# Patient Record
Sex: Male | Born: 1945 | Race: White | Hispanic: No | Marital: Married | Smoking: Former smoker
Health system: Southern US, Community
[De-identification: ages and names within clinical notes are randomized; demographics above are authoritative.]

## PROBLEM LIST (undated history)

## (undated) DIAGNOSIS — G8929 Other chronic pain: Secondary | ICD-10-CM

## (undated) DIAGNOSIS — K861 Other chronic pancreatitis: Secondary | ICD-10-CM

## (undated) DIAGNOSIS — N400 Enlarged prostate without lower urinary tract symptoms: Secondary | ICD-10-CM

## (undated) DIAGNOSIS — Z973 Presence of spectacles and contact lenses: Secondary | ICD-10-CM

## (undated) DIAGNOSIS — E785 Hyperlipidemia, unspecified: Secondary | ICD-10-CM

## (undated) DIAGNOSIS — G4730 Sleep apnea, unspecified: Secondary | ICD-10-CM

## (undated) DIAGNOSIS — M542 Cervicalgia: Secondary | ICD-10-CM

## (undated) DIAGNOSIS — I319 Disease of pericardium, unspecified: Secondary | ICD-10-CM

## (undated) DIAGNOSIS — R202 Paresthesia of skin: Secondary | ICD-10-CM

## (undated) DIAGNOSIS — R410 Disorientation, unspecified: Secondary | ICD-10-CM

## (undated) DIAGNOSIS — R200 Anesthesia of skin: Secondary | ICD-10-CM

## (undated) DIAGNOSIS — M1990 Unspecified osteoarthritis, unspecified site: Secondary | ICD-10-CM

## (undated) DIAGNOSIS — R0602 Shortness of breath: Secondary | ICD-10-CM

## (undated) DIAGNOSIS — E119 Type 2 diabetes mellitus without complications: Secondary | ICD-10-CM

## (undated) DIAGNOSIS — K5090 Crohn's disease, unspecified, without complications: Secondary | ICD-10-CM

## (undated) DIAGNOSIS — M549 Dorsalgia, unspecified: Secondary | ICD-10-CM

## (undated) HISTORY — PX: EYE SURGERY: SHX253

## (undated) HISTORY — DX: Disorientation, unspecified: R41.0

## (undated) HISTORY — PX: COLONOSCOPY: SHX174

## (undated) HISTORY — PX: APPENDECTOMY: SHX54

## (undated) HISTORY — DX: Hyperlipidemia, unspecified: E78.5

## (undated) HISTORY — PX: TONSILLECTOMY: SUR1361

## (undated) HISTORY — DX: Benign prostatic hyperplasia without lower urinary tract symptoms: N40.0

## (undated) HISTORY — PX: BACK SURGERY: SHX140

## (undated) HISTORY — PX: CYSTECTOMY: SUR359

## (undated) HISTORY — DX: Disease of pericardium, unspecified: I31.9

## (undated) HISTORY — DX: Dorsalgia, unspecified: M54.9

## (undated) HISTORY — PX: UMBILICAL HERNIA REPAIR: SHX196

## (undated) HISTORY — DX: Other chronic pain: G89.29

## (undated) HISTORY — DX: Type 2 diabetes mellitus without complications: E11.9

## (undated) HISTORY — DX: Cervicalgia: M54.2

## (undated) HISTORY — DX: Shortness of breath: R06.02

## (undated) HISTORY — PX: ROTATOR CUFF REPAIR: SHX139

---

## 2005-01-05 ENCOUNTER — Encounter: Admission: RE | Admit: 2005-01-05 | Discharge: 2005-01-05 | Payer: Self-pay | Admitting: Orthopedic Surgery

## 2007-07-13 ENCOUNTER — Encounter: Payer: Self-pay | Admitting: Cardiology

## 2007-07-13 ENCOUNTER — Ambulatory Visit: Payer: Self-pay | Admitting: Cardiology

## 2008-06-27 ENCOUNTER — Encounter: Payer: Self-pay | Admitting: Cardiology

## 2008-06-28 ENCOUNTER — Encounter: Payer: Self-pay | Admitting: Cardiology

## 2008-12-12 ENCOUNTER — Encounter: Payer: Self-pay | Admitting: Cardiology

## 2009-08-07 ENCOUNTER — Ambulatory Visit: Payer: Self-pay | Admitting: Cardiology

## 2009-08-07 ENCOUNTER — Encounter: Payer: Self-pay | Admitting: Cardiology

## 2009-09-08 ENCOUNTER — Encounter: Payer: Self-pay | Admitting: Cardiology

## 2009-09-12 ENCOUNTER — Encounter: Payer: Self-pay | Admitting: Cardiology

## 2009-11-03 ENCOUNTER — Encounter: Payer: Self-pay | Admitting: Cardiology

## 2009-12-16 ENCOUNTER — Encounter: Payer: Self-pay | Admitting: Cardiology

## 2009-12-17 ENCOUNTER — Ambulatory Visit: Payer: Self-pay | Admitting: Cardiology

## 2009-12-17 DIAGNOSIS — R5381 Other malaise: Secondary | ICD-10-CM

## 2009-12-17 DIAGNOSIS — R5383 Other fatigue: Secondary | ICD-10-CM

## 2009-12-17 DIAGNOSIS — E669 Obesity, unspecified: Secondary | ICD-10-CM

## 2009-12-17 DIAGNOSIS — R0602 Shortness of breath: Secondary | ICD-10-CM

## 2009-12-22 ENCOUNTER — Encounter: Payer: Self-pay | Admitting: Cardiology

## 2009-12-22 ENCOUNTER — Ambulatory Visit: Payer: Self-pay | Admitting: Cardiology

## 2010-01-21 ENCOUNTER — Ambulatory Visit: Payer: Self-pay | Admitting: Cardiology

## 2010-10-07 ENCOUNTER — Encounter: Admission: RE | Admit: 2010-10-07 | Discharge: 2010-10-07 | Payer: Self-pay | Admitting: Specialist

## 2010-10-08 ENCOUNTER — Observation Stay (HOSPITAL_COMMUNITY): Admission: RE | Admit: 2010-10-08 | Discharge: 2010-10-10 | Payer: Self-pay | Admitting: Specialist

## 2010-12-29 NOTE — Assessment & Plan Note (Signed)
Summary: f/u [place]   Visit Type:  Follow-up Primary Provider:  Dr. Erasmo Downer  CC:  Dyspnea.  History of Present Illness: The patient presents for followup of his complaints that included leg weakness and dyspnea. An echocardiogram to specifically look for any evidence of constrictive pericarditis in a patient with a history of previous pericarditis and effusion demonstrated no such abnormality. He had a well-preserved ejection fraction and no significant valvular abnormalities. Since that last visit the patient has been found to have a low testosterone level and is currently having this replaced. With this he is starting to feel better. He thinks he is a little less dyspneic and has a little more strength in his legs. He is not describing any new chest pressure, neck or arm discomfort. He is not having any palpitations, presyncope or syncope. He is not having PND or orthopnea.  Preventive Screening-Counseling & Management  Alcohol-Tobacco     Smoking Status: quit > 6 months  Comments: Quit in May or June of 2010. Smoked for about 40 yrs  Current Medications (verified): 1)  Lantus 100 Unit/ml Soln (Insulin Glargine) .... Use As Directed 2)  Apidra 100 Unit/ml Soln (Insulin Glulisine) .... Use As Directed 3)  Melatonin 5 Mg Tabs (Melatonin) .... Take 1 Tablet By Mouth Once A Day 4)  Aspirin 81 Mg Tbec (Aspirin) .... Take 1 Tablet By Mouth Once A Day 5)  Pravachol 20 Mg Tabs (Pravastatin Sodium) .... Take 1 Tablet By Mouth Once A Day 6)  Percocet 10-325 Mg Tabs (Oxycodone-Acetaminophen) .... Pt Takes Up To 7 Times Per Day 7)  Soma 350 Mg Tabs (Carisoprodol) .... Take 1 Tablet By Mouth Four Times Per Day 8)  Neurontin 600 Mg Tabs (Gabapentin) .... Take 1 Tablet By Mouth Four Times Per Day 9)  Ambien 5 Mg Tabs (Zolpidem Tartrate) .... Take 1 Tablet By Mouth Once A Day 10)  Metformin Hcl 500 Mg Tabs (Metformin Hcl) .... Take 1 Tablet By Mouth Two Times A Day 11)  Avodart 0.5 Mg Caps  (Dutasteride) .... Take 1 Tablet By Mouth Once A Day 12)  Testosterone Cypionate 100 Mg/ml Oil (Testosterone Cypionate) .... Inject 1 Ml Once Per Week 13)  B Complex  Tabs (B Complex Vitamins) .... Take 1 Tablet By Mouth Once A Day 14)  Vitamin D 2000 Unit Tabs (Cholecalciferol) .... Take 1 Tablet By Mouth Once A Day  Allergies: 1)  ! Lodine 2)  ! * Contrast Dye 3)  Nsaids  Comments:  Nurse/Medical Assistant: The patient's medications were reviewed with the patient and were updated in the Medication List. Pt verbally confirmed medications.  Cyril Loosen, RN, BSN (January 21, 2010 12:30 PM)  Past History:  Past Medical History: Reviewed history from 12/17/2009 and no changes required. Shortness of Breath Confusion Hyperlipidemia mild x 1 year Type II diabetes x 7 years Chronic back and neck pain BPH Pericarditis 6 years ago  Past Surgical History: Reviewed history from 12/17/2009 and no changes required. Right bicep rotator cuff surgery Umbilical hernia surgery/appendectomy  Review of Systems       As stated in the HPI and negative for all other systems.   Vital Signs:  Patient profile:   65 year old male Height:      64 inches Weight:      227 pounds BMI:     39.11 Pulse rate:   77 / minute BP sitting:   116 / 81  (left arm) Cuff size:   regular  Vitals Entered By: Cyril Loosen, RN, BSN (January 21, 2010 12:25 PM)  Nutrition Counseling: Patient's BMI is greater than 25 and therefore counseled on weight management options. CC: Dyspnea Comments follow up visit   Physical Exam  General:  Well developed, well nourished, in no acute distress. Head:  normocephalic and atraumatic Eyes:  PERRLA/EOM intact; conjunctiva and lids normal. Mouth:  Teeth, gums and palate normal. Oral mucosa normal. Neck:  Neck supple, no JVD. No masses, thyromegaly or abnormal cervical nodes. Chest Wall:  no deformities or breast masses noted Lungs:  Clear bilaterally to  auscultation and percussion. Heart:  Non-displaced PMI, chest non-tender; regular rate and rhythm, S1, S2 without murmurs, rubs or gallops. Carotid upstroke normal, no bruit. Normal abdominal aortic size, no bruits. Femorals normal pulses, no bruits. Pedals normal pulses. No edema, no varicosities. Abdomen:  Bowel sounds positive; abdomen soft and non-tender without masses, organomegaly, or hernias noted. No hepatosplenomegaly, obese Msk:  Back normal, normal gait. Muscle strength and tone normal. Extremities:  No clubbing or cyanosis. Neurologic:  Alert and oriented x 3. Skin:  Intact without lesions or rashes. Psych:  Normal affect.   Impression & Recommendations:  Problem # 1:  DYSPNEA (ICD-786.05) At this point I don't see a clear etiology for his dyspnea. It may be related to weight gain when he stopped smoking and deconditioning. As he is getting stronger with the testosterone replacement we will see if he can increase his physical activity and developed less dyspnea rather than more. If he does not improve I will do cardiopulmonary stress testing.  Problem # 2:  OBESITY, UNSPECIFIED (ICD-278.00) He understands the need to add weight loss to his exercise regimen to see if the above problem improves.  Problem # 3:  WEAKNESS (ICD-780.79) This may be related to his low testosterone level. He will see if this slowly improved. He does have a followup appointment with his back surgeon to see if this could be contributing as well as the weakness is predominantly in his legs.  Patient Instructions: 1)  Your physician recommends that you continue on your current medications as directed. Please refer to the Current Medication list given to you today. 2)  Contact our office if you need follow up in the future.

## 2010-12-29 NOTE — Assessment & Plan Note (Signed)
Summary: Micheal Parker (exertional)   Visit Type:  Initial Consult Primary Provider:  Dr. Erasmo Downer  CC:  Chest pain and sob.  History of Present Illness: The patient presents for evaluation of dyspnea and leg fatigue. He has had a history of leg fatigue dating back to April 2011. 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 He became much less active. He described numbness and weakness in his legs. He did not describe leg pain or cramping. He did not have swelling. Around that time he quit smoking. With his decreased activity and tobacco cessation he gained about 20 pounds. He began to slowly noticed significantly increased dyspnea with minimal activity such as climbing a flight of stairs. Sitting around doing nothing he will have occasional episodes where he feels like he needs to take a deep breath. He does not describe PND or orthopnea. He does describe chest discomfort but it seems to be more with taking a deep breath or certain movements. He was referred for a stress echocardiogram which became a dobutamine echocardiogram. He had an ejection fraction of 55-60% with no wall motion. Pulmonary function testing was not particularly abnormal either. I reviewed both of these results.  Preventive Screening-Counseling & Management  Alcohol-Tobacco     Smoking Status: quit > 6 months     Year Quit: 2010  Comments: Pt quit smoking around May of 2010, smoked for about 46 yrs  Current Medications (verified): 1)  Lantus 100 Unit/ml Soln (Insulin Glargine) .... Use As Directed 2)  Apidra 100 Unit/ml Soln (Insulin Glulisine) .... Use As Directed 3)  Melatonin 5 Mg Tabs (Melatonin) .... Take 1 Tablet By Mouth Once A Day 4)  Aspirin 81 Mg Tbec (Aspirin) .... Take 1 Tablet By Mouth Once A Day 5)  Pravachol 20 Mg Tabs (Pravastatin Sodium) .... Take 1 Tablet By Mouth Once A Day 6)  Percocet 10-325 Mg Tabs  (Oxycodone-Acetaminophen) .... Pt Takes Up To 7 Times Per Day 7)  Soma 350 Mg Tabs (Carisoprodol) .... Take 1 Tablet By Mouth Four Times Per Day 8)  Neurontin 600 Mg Tabs (Gabapentin) .... Take 1 Tablet By Mouth Four Times Per Day 9)  Ambien 5 Mg Tabs (Zolpidem Tartrate) .... Take 1 Tablet By Mouth Once A Day 10)  Metformin Hcl 500 Mg Tabs (Metformin Hcl) .... Take 1 Tablet By Mouth Two Times A Day 11)  Avodart 0.5 Mg Caps (Dutasteride) .... Take 1 Tablet By Mouth Once A Day 12)  Testosterone Cypionate 100 Mg/ml Oil (Testosterone Cypionate) .... Inject 1/2 Ml Every 2 Weeks 13)  B Complex  Tabs (B Complex Vitamins) .... Take 1 Tablet By Mouth Once A Day 14)  Vitamin D 2000 Unit Tabs (Cholecalciferol) .... Take 1 Tablet By Mouth Once A Day 15)  Lamisil 250 Mg Tabs (Terbinafine Hcl) .... Take 1 Tablet By Mouth Once A Day  Allergies: 1)  ! Lodine 2)  ! * Contrast Dye 3)  Nsaids  Comments:  Nurse/Medical Assistant: The patient's medications were reviewed with the patient and were updated in the Medication List. Pt brought medication list to office visit today. Cyril Loosen, RN, BSN (December 17, 2009 10:18 AM)  Past History:  Past Medical History: Shortness of Breath Confusion Hyperlipidemia mild x 1 year Type II diabetes x 7 years Chronic back and neck pain BPH Pericarditis 6 years ago  Past Surgical History: Right bicep rotator  cuff surgery Umbilical hernia surgery/appendectomy  Family History: Family Hx of Hypertension, Hyperlipidemia, Chronic Back pain, type II Diabetes with complications including retinopathy, neuropathy.  Father starting with heart disease MI late 14s,  Brother with MI in eary 66s.  Both father and brother died of pulmonary emboli  Social History: Unable to work due to chronic back and neck pain Married  Quit tobacco last year after 40 years 2ppd Smoking Status:  quit > 6 months  Review of Systems       As stated in the HPI and negative for all  other systems.   Vital Signs:  Patient profile:   65 year old male Height:      64 inches Weight:      219.50 pounds BMI:     37.81 Pulse rate:   76 / minute BP sitting:   119 / 70  (left arm) Cuff size:   regular  Vitals Entered By: Cyril Loosen, RN, BSN (December 17, 2009 10:05 AM)  Nutrition Counseling: Patient's BMI is greater than 25 and therefore counseled on weight management options. CC: Chest pain, sob   Physical Exam  General:  Well developed, well nourished, in no acute distress. Head:  normocephalic and atraumatic Eyes:  PERRLA/EOM intact; conjunctiva and lids normal. Mouth:  Teeth, gums and palate normal. Oral mucosa normal. Neck:  Neck supple, no JVD. No masses, thyromegaly or abnormal cervical nodes. Chest Wall:  no deformities or breast masses noted Lungs:  Clear bilaterally to auscultation and percussion. Abdomen:  Bowel sounds positive; abdomen soft and non-tender without masses, organomegaly, or hernias noted. No hepatosplenomegaly, obese Msk:  Back normal, normal gait. Muscle strength and tone normal. Extremities:  No clubbing or cyanosis. Neurologic:  Alert and oriented x 3. Skin:  Intact without lesions or rashes. Cervical Nodes:  no significant adenopathy Axillary Nodes:  no significant adenopathy Inguinal Nodes:  no significant adenopathy Psych:  Normal affect.   Detailed Cardiovascular Exam  Neck    Carotids: Carotids full and equal bilaterally without bruits.      Neck Veins: Normal, no JVD.    Heart    Inspection: no deformities or lifts noted.      Palpation: normal PMI with no thrills palpable.      Auscultation: regular rate and rhythm, S1, S2 without murmurs, rubs, gallops, or clicks.    Vascular    Abdominal Aorta: no palpable masses, pulsations, or audible bruits.      Femoral Pulses: normal femoral pulses bilaterally.      Pedal Pulses: normal pedal pulses bilaterally.      Radial Pulses: normal radial pulses bilaterally.       Peripheral Circulation: no clubbing, cyanosis, or edema noted with normal capillary refill.     EKG  Procedure date:  12/17/2009  Findings:      sinus rhythm, rate 73, axis within normal limits, intervals within normal limits, no acute ST-T wave changes.  Impression & Recommendations:  Problem # 1:  DYSPNEA (ICD-786.05) This is the predominant complaint. Based on the stress echocardiogram I would not have a high suspicion for obstructive coronary disease though this still remains a possibility. I would like to get a dedicated echocardiogram to evaluate the possibility of diastolic dysfunction or perhaps constrictive pericarditis related to his previous pericarditis. Is also a family history of pulmonary emboli though both of these relatives had extenuating risk factors. I would like to assess his pulmonary pressures with this echo and will have a low threshold for VQ  scanning. I will also check a BNP level. Orders: T-BNP  (B Natriuretic Peptide) ([id number]) 2-D Echocardiogram (2D Echo)  Problem # 2:  OBESITY, UNSPECIFIED (ICD-278.00) He understands the need to lose weight with diet now as he cannot exercise.  Problem # 3:  WEAKNESS (ICD-780.79) I am concerned that this could be related to his chronic back problems and have asked him to get an appointment with his orthopedist to consider this possibility. At this point I do not strongly suspect a vascular etiology.  Other Orders: EKG w/ Interpretation (93000)  Patient Instructions: 1)  Your physician has requested that you have an echocardiogram.  Echocardiography is a painless test that uses sound waves to create images of your heart. It provides your doctor with information about the size and shape of your heart and how well your heart's chambers and valves are working.  This procedure takes approximately one hour. There are no restrictions for this procedure. You will be called by Vicky to have this scheduled. 2)  Your physician  recommends that you return for lab work [id number] AT THE [facility] FOR A BNP. 3)  Your physician recommends that you schedule a follow-up appointment in: 01/21/10 @12 :00 NOON WITH DR. Alice Burnside.

## 2010-12-29 NOTE — Letter (Signed)
Summary: Internal Other/ PATIENT INFORMATION  Internal Other/ PATIENT INFORMATION   Imported By: Dorise Hiss 12/17/2009 12:06:35  _____________________________________________________________________  External Attachment:    Type:   Image     Comment:   External Document

## 2011-01-14 IMAGING — CR DG OR PORTABLE SPINE
1 series · 1 of 1 positions shown · non-contrast
Comparison: Radiographs dated [DATE] and 10/06/2010

CLINICAL DATA: Herniated lumbar disc at L3-4.

PORTABLE SPINE

[series [date]]
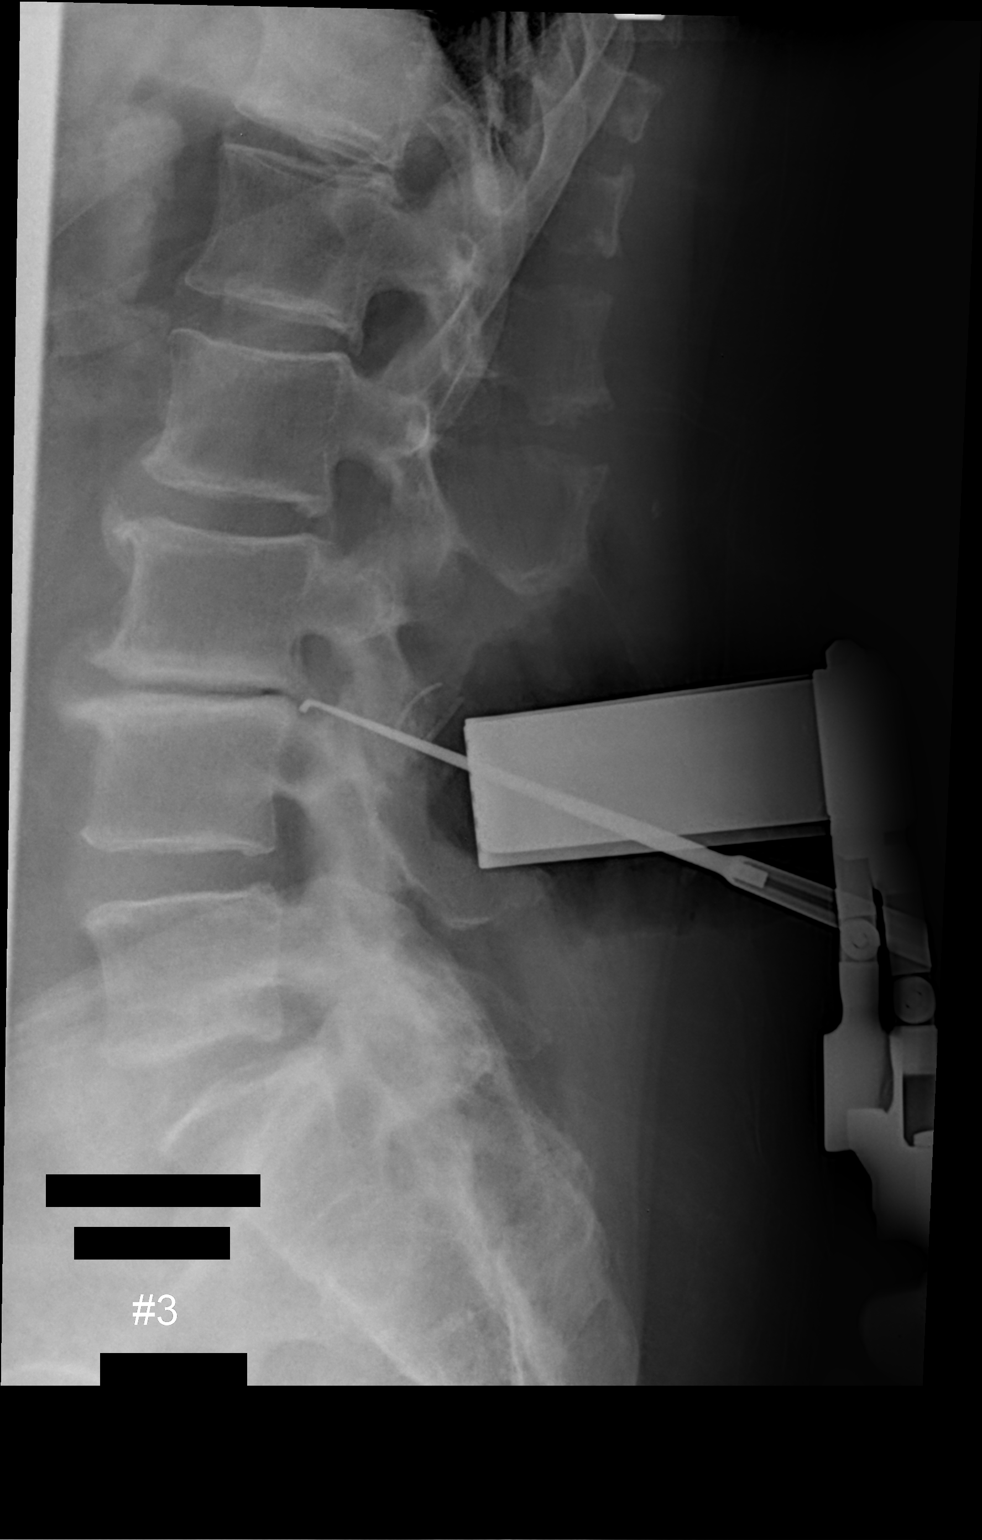

[1 of 1 positions shown; findings below may reference images not displayed]

FINDINGS: There is a probe at the L3-4 level.
IMPRESSION: Instrument at L3-4 disc space level.

## 2011-02-09 LAB — URINALYSIS, ROUTINE W REFLEX MICROSCOPIC
Bilirubin Urine: NEGATIVE
Ketones, ur: NEGATIVE mg/dL
Leukocytes, UA: NEGATIVE
Nitrite: NEGATIVE
Urobilinogen, UA: 0.2 mg/dL (ref 0.0–1.0)
pH: 5.5 (ref 5.0–8.0)

## 2011-02-09 LAB — GLUCOSE, CAPILLARY
Glucose-Capillary: 103 mg/dL — ABNORMAL HIGH (ref 70–99)
Glucose-Capillary: 118 mg/dL — ABNORMAL HIGH (ref 70–99)
Glucose-Capillary: 120 mg/dL — ABNORMAL HIGH (ref 70–99)
Glucose-Capillary: 133 mg/dL — ABNORMAL HIGH (ref 70–99)
Glucose-Capillary: 140 mg/dL — ABNORMAL HIGH (ref 70–99)
Glucose-Capillary: 148 mg/dL — ABNORMAL HIGH (ref 70–99)
Glucose-Capillary: 157 mg/dL — ABNORMAL HIGH (ref 70–99)
Glucose-Capillary: 174 mg/dL — ABNORMAL HIGH (ref 70–99)
Glucose-Capillary: 182 mg/dL — ABNORMAL HIGH (ref 70–99)
Glucose-Capillary: 86 mg/dL (ref 70–99)

## 2011-02-09 LAB — CBC
HCT: 51.7 % (ref 39.0–52.0)
Hemoglobin: 17.5 g/dL — ABNORMAL HIGH (ref 13.0–17.0)
MCH: 30.2 pg (ref 26.0–34.0)
MCHC: 33.8 g/dL (ref 30.0–36.0)
MCHC: 33.9 g/dL (ref 30.0–36.0)
MCV: 89.3 fL (ref 78.0–100.0)
Platelets: 201 10*3/uL (ref 150–400)
RBC: 5.79 MIL/uL (ref 4.22–5.81)
RDW: 14.7 % (ref 11.5–15.5)
WBC: 12 10*3/uL — ABNORMAL HIGH (ref 4.0–10.5)

## 2011-02-09 LAB — SURGICAL PCR SCREEN
MRSA, PCR: NEGATIVE
Staphylococcus aureus: NEGATIVE

## 2011-02-09 LAB — COMPREHENSIVE METABOLIC PANEL
AST: 21 U/L (ref 0–37)
BUN: 9 mg/dL (ref 6–23)
CO2: 28 mEq/L (ref 19–32)
Calcium: 8.9 mg/dL (ref 8.4–10.5)
Chloride: 97 mEq/L (ref 96–112)
Creatinine, Ser: 1.27 mg/dL (ref 0.4–1.5)
GFR calc Af Amer: >60 mL/min (ref >60)
GFR calc non Af Amer: 57 mL/min — ABNORMAL LOW (ref >60)
Glucose, Bld: 260 mg/dL — ABNORMAL HIGH (ref 70–99)
Total Bilirubin: 0.7 mg/dL (ref 0.3–1.2)

## 2011-02-09 LAB — BASIC METABOLIC PANEL
BUN: 7 mg/dL (ref 6–23)
Calcium: 8.5 mg/dL (ref 8.4–10.5)
Creatinine, Ser: 1.17 mg/dL (ref 0.4–1.5)
GFR calc non Af Amer: >60 mL/min (ref >60)
Glucose, Bld: 136 mg/dL — ABNORMAL HIGH (ref 70–99)
Potassium: 4.1 mEq/L (ref 3.5–5.1)

## 2011-02-09 LAB — APTT: aPTT: 33 seconds (ref 24–37)

## 2011-02-09 LAB — PROTIME-INR
INR: 0.97 (ref 0.00–1.49)
Prothrombin Time: 13.1 seconds (ref 11.6–15.2)

## 2011-02-09 LAB — URINE MICROSCOPIC-ADD ON

## 2012-05-25 ENCOUNTER — Encounter: Payer: Self-pay | Admitting: *Deleted

## 2012-12-13 DIAGNOSIS — K5900 Constipation, unspecified: Secondary | ICD-10-CM | POA: Diagnosis not present

## 2012-12-13 DIAGNOSIS — R198 Other specified symptoms and signs involving the digestive system and abdomen: Secondary | ICD-10-CM | POA: Diagnosis not present

## 2012-12-18 DIAGNOSIS — K5090 Crohn's disease, unspecified, without complications: Secondary | ICD-10-CM | POA: Diagnosis not present

## 2012-12-18 DIAGNOSIS — Z79899 Other long term (current) drug therapy: Secondary | ICD-10-CM | POA: Diagnosis not present

## 2012-12-18 DIAGNOSIS — E669 Obesity, unspecified: Secondary | ICD-10-CM | POA: Diagnosis not present

## 2012-12-18 DIAGNOSIS — K5289 Other specified noninfective gastroenteritis and colitis: Secondary | ICD-10-CM | POA: Diagnosis not present

## 2012-12-18 DIAGNOSIS — G4700 Insomnia, unspecified: Secondary | ICD-10-CM | POA: Diagnosis not present

## 2012-12-18 DIAGNOSIS — Z6834 Body mass index (BMI) 34.0-34.9, adult: Secondary | ICD-10-CM | POA: Diagnosis not present

## 2012-12-18 DIAGNOSIS — Z7982 Long term (current) use of aspirin: Secondary | ICD-10-CM | POA: Diagnosis not present

## 2012-12-18 DIAGNOSIS — E119 Type 2 diabetes mellitus without complications: Secondary | ICD-10-CM | POA: Diagnosis not present

## 2012-12-18 DIAGNOSIS — N529 Male erectile dysfunction, unspecified: Secondary | ICD-10-CM | POA: Diagnosis not present

## 2012-12-18 DIAGNOSIS — K8689 Other specified diseases of pancreas: Secondary | ICD-10-CM | POA: Diagnosis not present

## 2012-12-18 DIAGNOSIS — N281 Cyst of kidney, acquired: Secondary | ICD-10-CM | POA: Diagnosis not present

## 2012-12-18 DIAGNOSIS — G589 Mononeuropathy, unspecified: Secondary | ICD-10-CM | POA: Diagnosis not present

## 2012-12-18 DIAGNOSIS — IMO0002 Reserved for concepts with insufficient information to code with codable children: Secondary | ICD-10-CM | POA: Diagnosis not present

## 2012-12-18 DIAGNOSIS — G8929 Other chronic pain: Secondary | ICD-10-CM | POA: Diagnosis not present

## 2012-12-18 DIAGNOSIS — Z91041 Radiographic dye allergy status: Secondary | ICD-10-CM | POA: Diagnosis not present

## 2012-12-18 DIAGNOSIS — K639 Disease of intestine, unspecified: Secondary | ICD-10-CM | POA: Diagnosis not present

## 2012-12-18 DIAGNOSIS — K6389 Other specified diseases of intestine: Secondary | ICD-10-CM | POA: Diagnosis not present

## 2012-12-18 DIAGNOSIS — K5900 Constipation, unspecified: Secondary | ICD-10-CM | POA: Diagnosis not present

## 2012-12-18 DIAGNOSIS — N400 Enlarged prostate without lower urinary tract symptoms: Secondary | ICD-10-CM | POA: Diagnosis not present

## 2012-12-18 DIAGNOSIS — Z794 Long term (current) use of insulin: Secondary | ICD-10-CM | POA: Diagnosis not present

## 2012-12-18 DIAGNOSIS — R198 Other specified symptoms and signs involving the digestive system and abdomen: Secondary | ICD-10-CM | POA: Diagnosis not present

## 2012-12-18 DIAGNOSIS — E785 Hyperlipidemia, unspecified: Secondary | ICD-10-CM | POA: Diagnosis not present

## 2013-01-04 DIAGNOSIS — K6389 Other specified diseases of intestine: Secondary | ICD-10-CM | POA: Diagnosis not present

## 2013-02-01 DIAGNOSIS — K5000 Crohn's disease of small intestine without complications: Secondary | ICD-10-CM | POA: Diagnosis not present

## 2013-02-06 DIAGNOSIS — M5030 Other cervical disc degeneration, unspecified cervical region: Secondary | ICD-10-CM | POA: Diagnosis not present

## 2013-02-06 DIAGNOSIS — M51379 Other intervertebral disc degeneration, lumbosacral region without mention of lumbar back pain or lower extremity pain: Secondary | ICD-10-CM | POA: Diagnosis not present

## 2013-02-09 DIAGNOSIS — G8929 Other chronic pain: Secondary | ICD-10-CM | POA: Diagnosis not present

## 2013-02-09 DIAGNOSIS — E785 Hyperlipidemia, unspecified: Secondary | ICD-10-CM | POA: Diagnosis not present

## 2013-02-09 DIAGNOSIS — Z6835 Body mass index (BMI) 35.0-35.9, adult: Secondary | ICD-10-CM | POA: Diagnosis not present

## 2013-02-09 DIAGNOSIS — Z87891 Personal history of nicotine dependence: Secondary | ICD-10-CM | POA: Diagnosis not present

## 2013-02-09 DIAGNOSIS — K5900 Constipation, unspecified: Secondary | ICD-10-CM | POA: Diagnosis not present

## 2013-02-09 DIAGNOSIS — Z8489 Family history of other specified conditions: Secondary | ICD-10-CM | POA: Diagnosis not present

## 2013-02-09 DIAGNOSIS — D126 Benign neoplasm of colon, unspecified: Secondary | ICD-10-CM | POA: Diagnosis not present

## 2013-02-09 DIAGNOSIS — Z833 Family history of diabetes mellitus: Secondary | ICD-10-CM | POA: Diagnosis not present

## 2013-02-09 DIAGNOSIS — K5000 Crohn's disease of small intestine without complications: Secondary | ICD-10-CM | POA: Diagnosis not present

## 2013-02-09 DIAGNOSIS — E291 Testicular hypofunction: Secondary | ICD-10-CM | POA: Diagnosis not present

## 2013-02-09 DIAGNOSIS — E663 Overweight: Secondary | ICD-10-CM | POA: Diagnosis not present

## 2013-02-09 DIAGNOSIS — E119 Type 2 diabetes mellitus without complications: Secondary | ICD-10-CM | POA: Diagnosis not present

## 2013-02-09 DIAGNOSIS — K5730 Diverticulosis of large intestine without perforation or abscess without bleeding: Secondary | ICD-10-CM | POA: Diagnosis not present

## 2013-02-09 DIAGNOSIS — G4700 Insomnia, unspecified: Secondary | ICD-10-CM | POA: Diagnosis not present

## 2013-02-09 DIAGNOSIS — Z91041 Radiographic dye allergy status: Secondary | ICD-10-CM | POA: Diagnosis not present

## 2013-02-09 DIAGNOSIS — N400 Enlarged prostate without lower urinary tract symptoms: Secondary | ICD-10-CM | POA: Diagnosis not present

## 2013-02-09 DIAGNOSIS — G589 Mononeuropathy, unspecified: Secondary | ICD-10-CM | POA: Diagnosis not present

## 2013-02-27 DIAGNOSIS — E119 Type 2 diabetes mellitus without complications: Secondary | ICD-10-CM | POA: Diagnosis not present

## 2013-02-27 DIAGNOSIS — H2510 Age-related nuclear cataract, unspecified eye: Secondary | ICD-10-CM | POA: Diagnosis not present

## 2013-03-19 DIAGNOSIS — H538 Other visual disturbances: Secondary | ICD-10-CM | POA: Diagnosis not present

## 2013-03-19 DIAGNOSIS — H2510 Age-related nuclear cataract, unspecified eye: Secondary | ICD-10-CM | POA: Diagnosis not present

## 2013-03-19 DIAGNOSIS — H35059 Retinal neovascularization, unspecified, unspecified eye: Secondary | ICD-10-CM | POA: Diagnosis not present

## 2013-03-19 DIAGNOSIS — E119 Type 2 diabetes mellitus without complications: Secondary | ICD-10-CM | POA: Diagnosis not present

## 2013-03-20 DIAGNOSIS — E782 Mixed hyperlipidemia: Secondary | ICD-10-CM | POA: Diagnosis not present

## 2013-03-20 DIAGNOSIS — Z79899 Other long term (current) drug therapy: Secondary | ICD-10-CM | POA: Diagnosis not present

## 2013-03-29 DIAGNOSIS — M542 Cervicalgia: Secondary | ICD-10-CM | POA: Diagnosis not present

## 2013-03-29 DIAGNOSIS — M546 Pain in thoracic spine: Secondary | ICD-10-CM | POA: Diagnosis not present

## 2013-03-29 DIAGNOSIS — M545 Low back pain: Secondary | ICD-10-CM | POA: Diagnosis not present

## 2013-04-26 DIAGNOSIS — M542 Cervicalgia: Secondary | ICD-10-CM | POA: Diagnosis not present

## 2013-04-26 DIAGNOSIS — G894 Chronic pain syndrome: Secondary | ICD-10-CM | POA: Diagnosis not present

## 2013-04-26 DIAGNOSIS — M545 Low back pain: Secondary | ICD-10-CM | POA: Diagnosis not present

## 2013-04-26 DIAGNOSIS — M546 Pain in thoracic spine: Secondary | ICD-10-CM | POA: Diagnosis not present

## 2013-05-02 DIAGNOSIS — R079 Chest pain, unspecified: Secondary | ICD-10-CM | POA: Diagnosis not present

## 2013-05-02 DIAGNOSIS — G894 Chronic pain syndrome: Secondary | ICD-10-CM | POA: Diagnosis not present

## 2013-05-02 DIAGNOSIS — R109 Unspecified abdominal pain: Secondary | ICD-10-CM | POA: Diagnosis not present

## 2013-05-02 DIAGNOSIS — M47814 Spondylosis without myelopathy or radiculopathy, thoracic region: Secondary | ICD-10-CM | POA: Diagnosis not present

## 2013-05-02 DIAGNOSIS — M47817 Spondylosis without myelopathy or radiculopathy, lumbosacral region: Secondary | ICD-10-CM | POA: Diagnosis not present

## 2013-05-17 DIAGNOSIS — M545 Low back pain: Secondary | ICD-10-CM | POA: Diagnosis not present

## 2013-05-17 DIAGNOSIS — G894 Chronic pain syndrome: Secondary | ICD-10-CM | POA: Diagnosis not present

## 2013-05-17 DIAGNOSIS — M542 Cervicalgia: Secondary | ICD-10-CM | POA: Diagnosis not present

## 2013-05-17 DIAGNOSIS — Z79899 Other long term (current) drug therapy: Secondary | ICD-10-CM | POA: Diagnosis not present

## 2013-05-29 DIAGNOSIS — M545 Low back pain: Secondary | ICD-10-CM | POA: Diagnosis not present

## 2013-05-29 DIAGNOSIS — G894 Chronic pain syndrome: Secondary | ICD-10-CM | POA: Diagnosis not present

## 2013-08-28 DIAGNOSIS — M545 Low back pain: Secondary | ICD-10-CM | POA: Diagnosis not present

## 2013-08-28 DIAGNOSIS — M542 Cervicalgia: Secondary | ICD-10-CM | POA: Diagnosis not present

## 2013-08-28 DIAGNOSIS — M961 Postlaminectomy syndrome, not elsewhere classified: Secondary | ICD-10-CM | POA: Diagnosis not present

## 2013-08-28 DIAGNOSIS — G894 Chronic pain syndrome: Secondary | ICD-10-CM | POA: Diagnosis not present

## 2013-09-06 DIAGNOSIS — M542 Cervicalgia: Secondary | ICD-10-CM | POA: Diagnosis not present

## 2013-09-12 DIAGNOSIS — M542 Cervicalgia: Secondary | ICD-10-CM | POA: Diagnosis not present

## 2013-09-12 DIAGNOSIS — M545 Low back pain, unspecified: Secondary | ICD-10-CM | POA: Diagnosis not present

## 2013-09-17 DIAGNOSIS — Z23 Encounter for immunization: Secondary | ICD-10-CM | POA: Diagnosis not present

## 2013-09-20 DIAGNOSIS — M65839 Other synovitis and tenosynovitis, unspecified forearm: Secondary | ICD-10-CM | POA: Diagnosis not present

## 2013-09-27 DIAGNOSIS — M65839 Other synovitis and tenosynovitis, unspecified forearm: Secondary | ICD-10-CM | POA: Diagnosis not present

## 2013-09-28 DIAGNOSIS — E782 Mixed hyperlipidemia: Secondary | ICD-10-CM | POA: Diagnosis not present

## 2013-09-28 DIAGNOSIS — E1049 Type 1 diabetes mellitus with other diabetic neurological complication: Secondary | ICD-10-CM | POA: Diagnosis not present

## 2013-10-18 DIAGNOSIS — M65839 Other synovitis and tenosynovitis, unspecified forearm: Secondary | ICD-10-CM | POA: Diagnosis not present

## 2013-11-26 DIAGNOSIS — G894 Chronic pain syndrome: Secondary | ICD-10-CM | POA: Diagnosis not present

## 2013-11-26 DIAGNOSIS — M65839 Other synovitis and tenosynovitis, unspecified forearm: Secondary | ICD-10-CM | POA: Diagnosis not present

## 2013-11-27 DIAGNOSIS — J019 Acute sinusitis, unspecified: Secondary | ICD-10-CM | POA: Diagnosis not present

## 2013-11-27 DIAGNOSIS — J209 Acute bronchitis, unspecified: Secondary | ICD-10-CM | POA: Diagnosis not present

## 2013-11-28 DIAGNOSIS — J209 Acute bronchitis, unspecified: Secondary | ICD-10-CM | POA: Diagnosis not present

## 2013-11-28 DIAGNOSIS — R0602 Shortness of breath: Secondary | ICD-10-CM | POA: Diagnosis not present

## 2013-11-28 DIAGNOSIS — J9801 Acute bronchospasm: Secondary | ICD-10-CM | POA: Diagnosis not present

## 2013-11-28 DIAGNOSIS — M47814 Spondylosis without myelopathy or radiculopathy, thoracic region: Secondary | ICD-10-CM | POA: Diagnosis not present

## 2013-11-28 DIAGNOSIS — Z0389 Encounter for observation for other suspected diseases and conditions ruled out: Secondary | ICD-10-CM | POA: Diagnosis not present

## 2013-11-28 DIAGNOSIS — R222 Localized swelling, mass and lump, trunk: Secondary | ICD-10-CM | POA: Diagnosis not present

## 2013-11-28 DIAGNOSIS — Z91041 Radiographic dye allergy status: Secondary | ICD-10-CM | POA: Diagnosis not present

## 2013-11-29 HISTORY — PX: CARDIAC CATHETERIZATION: SHX172

## 2013-12-11 DIAGNOSIS — M545 Low back pain, unspecified: Secondary | ICD-10-CM | POA: Diagnosis not present

## 2013-12-11 DIAGNOSIS — M542 Cervicalgia: Secondary | ICD-10-CM | POA: Diagnosis not present

## 2014-01-21 DIAGNOSIS — R109 Unspecified abdominal pain: Secondary | ICD-10-CM | POA: Diagnosis not present

## 2014-01-21 DIAGNOSIS — E782 Mixed hyperlipidemia: Secondary | ICD-10-CM | POA: Diagnosis not present

## 2014-01-21 DIAGNOSIS — IMO0001 Reserved for inherently not codable concepts without codable children: Secondary | ICD-10-CM | POA: Diagnosis not present

## 2014-01-23 DIAGNOSIS — R109 Unspecified abdominal pain: Secondary | ICD-10-CM | POA: Diagnosis not present

## 2014-01-23 DIAGNOSIS — N281 Cyst of kidney, acquired: Secondary | ICD-10-CM | POA: Diagnosis not present

## 2014-02-08 DIAGNOSIS — M542 Cervicalgia: Secondary | ICD-10-CM | POA: Diagnosis not present

## 2014-02-20 DIAGNOSIS — M542 Cervicalgia: Secondary | ICD-10-CM | POA: Diagnosis not present

## 2014-03-20 DIAGNOSIS — M545 Low back pain, unspecified: Secondary | ICD-10-CM | POA: Diagnosis not present

## 2014-03-20 DIAGNOSIS — M542 Cervicalgia: Secondary | ICD-10-CM | POA: Diagnosis not present

## 2014-03-21 DIAGNOSIS — IMO0001 Reserved for inherently not codable concepts without codable children: Secondary | ICD-10-CM | POA: Diagnosis not present

## 2014-03-21 DIAGNOSIS — I201 Angina pectoris with documented spasm: Secondary | ICD-10-CM | POA: Diagnosis not present

## 2014-03-25 DIAGNOSIS — M6530 Trigger finger, unspecified finger: Secondary | ICD-10-CM | POA: Diagnosis not present

## 2014-04-09 DIAGNOSIS — I209 Angina pectoris, unspecified: Secondary | ICD-10-CM | POA: Diagnosis not present

## 2014-04-09 DIAGNOSIS — R0602 Shortness of breath: Secondary | ICD-10-CM | POA: Diagnosis not present

## 2014-04-09 DIAGNOSIS — R079 Chest pain, unspecified: Secondary | ICD-10-CM | POA: Diagnosis not present

## 2014-04-10 DIAGNOSIS — M6530 Trigger finger, unspecified finger: Secondary | ICD-10-CM | POA: Diagnosis not present

## 2014-04-15 DIAGNOSIS — R0609 Other forms of dyspnea: Secondary | ICD-10-CM | POA: Diagnosis not present

## 2014-04-15 DIAGNOSIS — R0989 Other specified symptoms and signs involving the circulatory and respiratory systems: Secondary | ICD-10-CM | POA: Diagnosis not present

## 2014-04-15 DIAGNOSIS — E119 Type 2 diabetes mellitus without complications: Secondary | ICD-10-CM | POA: Diagnosis not present

## 2014-04-15 DIAGNOSIS — I209 Angina pectoris, unspecified: Secondary | ICD-10-CM | POA: Diagnosis not present

## 2014-04-15 DIAGNOSIS — Z87891 Personal history of nicotine dependence: Secondary | ICD-10-CM | POA: Diagnosis not present

## 2014-04-15 DIAGNOSIS — I2510 Atherosclerotic heart disease of native coronary artery without angina pectoris: Secondary | ICD-10-CM | POA: Diagnosis not present

## 2014-04-15 DIAGNOSIS — E7800 Pure hypercholesterolemia, unspecified: Secondary | ICD-10-CM | POA: Diagnosis not present

## 2014-04-15 DIAGNOSIS — Z794 Long term (current) use of insulin: Secondary | ICD-10-CM | POA: Diagnosis not present

## 2014-04-15 DIAGNOSIS — R079 Chest pain, unspecified: Secondary | ICD-10-CM | POA: Diagnosis not present

## 2014-04-25 DIAGNOSIS — Z4789 Encounter for other orthopedic aftercare: Secondary | ICD-10-CM | POA: Diagnosis not present

## 2014-04-25 DIAGNOSIS — M19049 Primary osteoarthritis, unspecified hand: Secondary | ICD-10-CM | POA: Diagnosis not present

## 2014-05-20 DIAGNOSIS — IMO0001 Reserved for inherently not codable concepts without codable children: Secondary | ICD-10-CM | POA: Diagnosis not present

## 2014-06-19 DIAGNOSIS — M961 Postlaminectomy syndrome, not elsewhere classified: Secondary | ICD-10-CM | POA: Diagnosis not present

## 2014-06-19 DIAGNOSIS — M5030 Other cervical disc degeneration, unspecified cervical region: Secondary | ICD-10-CM | POA: Diagnosis not present

## 2014-06-19 DIAGNOSIS — G894 Chronic pain syndrome: Secondary | ICD-10-CM | POA: Diagnosis not present

## 2014-07-01 DIAGNOSIS — Z5189 Encounter for other specified aftercare: Secondary | ICD-10-CM | POA: Diagnosis not present

## 2014-07-02 DIAGNOSIS — M25559 Pain in unspecified hip: Secondary | ICD-10-CM | POA: Diagnosis not present

## 2014-07-02 DIAGNOSIS — IMO0002 Reserved for concepts with insufficient information to code with codable children: Secondary | ICD-10-CM | POA: Diagnosis not present

## 2014-07-05 DIAGNOSIS — M25559 Pain in unspecified hip: Secondary | ICD-10-CM | POA: Diagnosis not present

## 2014-07-05 DIAGNOSIS — IMO0002 Reserved for concepts with insufficient information to code with codable children: Secondary | ICD-10-CM | POA: Diagnosis not present

## 2014-07-08 DIAGNOSIS — IMO0002 Reserved for concepts with insufficient information to code with codable children: Secondary | ICD-10-CM | POA: Diagnosis not present

## 2014-07-08 DIAGNOSIS — M25559 Pain in unspecified hip: Secondary | ICD-10-CM | POA: Diagnosis not present

## 2014-07-10 DIAGNOSIS — M25559 Pain in unspecified hip: Secondary | ICD-10-CM | POA: Diagnosis not present

## 2014-07-10 DIAGNOSIS — IMO0002 Reserved for concepts with insufficient information to code with codable children: Secondary | ICD-10-CM | POA: Diagnosis not present

## 2014-07-16 DIAGNOSIS — M25559 Pain in unspecified hip: Secondary | ICD-10-CM | POA: Diagnosis not present

## 2014-07-16 DIAGNOSIS — IMO0002 Reserved for concepts with insufficient information to code with codable children: Secondary | ICD-10-CM | POA: Diagnosis not present

## 2014-07-17 DIAGNOSIS — M542 Cervicalgia: Secondary | ICD-10-CM | POA: Diagnosis not present

## 2014-07-17 DIAGNOSIS — M47817 Spondylosis without myelopathy or radiculopathy, lumbosacral region: Secondary | ICD-10-CM | POA: Diagnosis not present

## 2014-07-17 DIAGNOSIS — M545 Low back pain, unspecified: Secondary | ICD-10-CM | POA: Diagnosis not present

## 2014-07-19 DIAGNOSIS — Z0000 Encounter for general adult medical examination without abnormal findings: Secondary | ICD-10-CM | POA: Diagnosis not present

## 2014-07-19 DIAGNOSIS — M25559 Pain in unspecified hip: Secondary | ICD-10-CM | POA: Diagnosis not present

## 2014-07-19 DIAGNOSIS — IMO0001 Reserved for inherently not codable concepts without codable children: Secondary | ICD-10-CM | POA: Diagnosis not present

## 2014-07-19 DIAGNOSIS — E782 Mixed hyperlipidemia: Secondary | ICD-10-CM | POA: Diagnosis not present

## 2014-07-19 DIAGNOSIS — IMO0002 Reserved for concepts with insufficient information to code with codable children: Secondary | ICD-10-CM | POA: Diagnosis not present

## 2014-07-25 DIAGNOSIS — IMO0002 Reserved for concepts with insufficient information to code with codable children: Secondary | ICD-10-CM | POA: Diagnosis not present

## 2014-07-25 DIAGNOSIS — M25559 Pain in unspecified hip: Secondary | ICD-10-CM | POA: Diagnosis not present

## 2014-07-29 DIAGNOSIS — M25559 Pain in unspecified hip: Secondary | ICD-10-CM | POA: Diagnosis not present

## 2014-07-29 DIAGNOSIS — IMO0002 Reserved for concepts with insufficient information to code with codable children: Secondary | ICD-10-CM | POA: Diagnosis not present

## 2014-07-31 DIAGNOSIS — IMO0002 Reserved for concepts with insufficient information to code with codable children: Secondary | ICD-10-CM | POA: Diagnosis not present

## 2014-07-31 DIAGNOSIS — M25559 Pain in unspecified hip: Secondary | ICD-10-CM | POA: Diagnosis not present

## 2014-08-07 DIAGNOSIS — IMO0002 Reserved for concepts with insufficient information to code with codable children: Secondary | ICD-10-CM | POA: Diagnosis not present

## 2014-08-07 DIAGNOSIS — M25559 Pain in unspecified hip: Secondary | ICD-10-CM | POA: Diagnosis not present

## 2014-08-09 DIAGNOSIS — IMO0002 Reserved for concepts with insufficient information to code with codable children: Secondary | ICD-10-CM | POA: Diagnosis not present

## 2014-08-09 DIAGNOSIS — M25559 Pain in unspecified hip: Secondary | ICD-10-CM | POA: Diagnosis not present

## 2014-08-12 DIAGNOSIS — Z5189 Encounter for other specified aftercare: Secondary | ICD-10-CM | POA: Diagnosis not present

## 2014-08-25 DIAGNOSIS — Z23 Encounter for immunization: Secondary | ICD-10-CM | POA: Diagnosis not present

## 2014-10-01 DIAGNOSIS — H2513 Age-related nuclear cataract, bilateral: Secondary | ICD-10-CM | POA: Diagnosis not present

## 2014-10-01 DIAGNOSIS — Z794 Long term (current) use of insulin: Secondary | ICD-10-CM | POA: Diagnosis not present

## 2014-10-01 DIAGNOSIS — E119 Type 2 diabetes mellitus without complications: Secondary | ICD-10-CM | POA: Diagnosis not present

## 2014-10-11 DIAGNOSIS — M5030 Other cervical disc degeneration, unspecified cervical region: Secondary | ICD-10-CM | POA: Diagnosis not present

## 2014-10-11 DIAGNOSIS — M961 Postlaminectomy syndrome, not elsewhere classified: Secondary | ICD-10-CM | POA: Diagnosis not present

## 2014-10-11 DIAGNOSIS — G894 Chronic pain syndrome: Secondary | ICD-10-CM | POA: Diagnosis not present

## 2014-10-25 DIAGNOSIS — B359 Dermatophytosis, unspecified: Secondary | ICD-10-CM | POA: Diagnosis not present

## 2014-10-25 DIAGNOSIS — I10 Essential (primary) hypertension: Secondary | ICD-10-CM | POA: Diagnosis not present

## 2014-10-25 DIAGNOSIS — E1140 Type 2 diabetes mellitus with diabetic neuropathy, unspecified: Secondary | ICD-10-CM | POA: Diagnosis not present

## 2014-10-25 DIAGNOSIS — Z13220 Encounter for screening for lipoid disorders: Secondary | ICD-10-CM | POA: Diagnosis not present

## 2014-11-05 DIAGNOSIS — M5082 Other cervical disc disorders, mid-cervical region: Secondary | ICD-10-CM | POA: Diagnosis not present

## 2014-11-05 DIAGNOSIS — M961 Postlaminectomy syndrome, not elsewhere classified: Secondary | ICD-10-CM | POA: Diagnosis not present

## 2014-11-05 DIAGNOSIS — M47817 Spondylosis without myelopathy or radiculopathy, lumbosacral region: Secondary | ICD-10-CM | POA: Diagnosis not present

## 2014-12-27 DIAGNOSIS — E1140 Type 2 diabetes mellitus with diabetic neuropathy, unspecified: Secondary | ICD-10-CM | POA: Diagnosis not present

## 2014-12-27 DIAGNOSIS — I10 Essential (primary) hypertension: Secondary | ICD-10-CM | POA: Diagnosis not present

## 2015-02-07 DIAGNOSIS — M5030 Other cervical disc degeneration, unspecified cervical region: Secondary | ICD-10-CM | POA: Diagnosis not present

## 2015-02-07 DIAGNOSIS — Z79891 Long term (current) use of opiate analgesic: Secondary | ICD-10-CM | POA: Diagnosis not present

## 2015-02-07 DIAGNOSIS — M961 Postlaminectomy syndrome, not elsewhere classified: Secondary | ICD-10-CM | POA: Diagnosis not present

## 2015-02-07 DIAGNOSIS — G894 Chronic pain syndrome: Secondary | ICD-10-CM | POA: Diagnosis not present

## 2015-03-04 DIAGNOSIS — M5082 Other cervical disc disorders, mid-cervical region: Secondary | ICD-10-CM | POA: Diagnosis not present

## 2015-03-04 DIAGNOSIS — M545 Low back pain: Secondary | ICD-10-CM | POA: Diagnosis not present

## 2015-03-04 DIAGNOSIS — M47817 Spondylosis without myelopathy or radiculopathy, lumbosacral region: Secondary | ICD-10-CM | POA: Diagnosis not present

## 2015-03-07 DIAGNOSIS — Z125 Encounter for screening for malignant neoplasm of prostate: Secondary | ICD-10-CM | POA: Diagnosis not present

## 2015-03-07 DIAGNOSIS — E1140 Type 2 diabetes mellitus with diabetic neuropathy, unspecified: Secondary | ICD-10-CM | POA: Diagnosis not present

## 2015-03-07 DIAGNOSIS — E1165 Type 2 diabetes mellitus with hyperglycemia: Secondary | ICD-10-CM | POA: Diagnosis not present

## 2015-03-07 DIAGNOSIS — I10 Essential (primary) hypertension: Secondary | ICD-10-CM | POA: Diagnosis not present

## 2015-03-07 DIAGNOSIS — R197 Diarrhea, unspecified: Secondary | ICD-10-CM | POA: Diagnosis not present

## 2015-05-01 DIAGNOSIS — M5032 Other cervical disc degeneration, mid-cervical region: Secondary | ICD-10-CM | POA: Diagnosis not present

## 2015-05-01 DIAGNOSIS — M961 Postlaminectomy syndrome, not elsewhere classified: Secondary | ICD-10-CM | POA: Diagnosis not present

## 2015-05-08 DIAGNOSIS — R109 Unspecified abdominal pain: Secondary | ICD-10-CM | POA: Diagnosis not present

## 2015-05-08 DIAGNOSIS — K861 Other chronic pancreatitis: Secondary | ICD-10-CM | POA: Diagnosis not present

## 2015-05-08 DIAGNOSIS — I10 Essential (primary) hypertension: Secondary | ICD-10-CM | POA: Diagnosis not present

## 2015-05-08 DIAGNOSIS — E1140 Type 2 diabetes mellitus with diabetic neuropathy, unspecified: Secondary | ICD-10-CM | POA: Diagnosis not present

## 2015-05-12 DIAGNOSIS — R109 Unspecified abdominal pain: Secondary | ICD-10-CM | POA: Diagnosis not present

## 2015-05-14 DIAGNOSIS — K760 Fatty (change of) liver, not elsewhere classified: Secondary | ICD-10-CM | POA: Diagnosis not present

## 2015-05-14 DIAGNOSIS — R935 Abnormal findings on diagnostic imaging of other abdominal regions, including retroperitoneum: Secondary | ICD-10-CM | POA: Diagnosis not present

## 2015-05-14 DIAGNOSIS — R109 Unspecified abdominal pain: Secondary | ICD-10-CM | POA: Diagnosis not present

## 2015-05-14 DIAGNOSIS — N281 Cyst of kidney, acquired: Secondary | ICD-10-CM | POA: Diagnosis not present

## 2015-05-14 DIAGNOSIS — Q6102 Congenital multiple renal cysts: Secondary | ICD-10-CM | POA: Diagnosis not present

## 2015-05-28 DIAGNOSIS — M5032 Other cervical disc degeneration, mid-cervical region: Secondary | ICD-10-CM | POA: Diagnosis not present

## 2015-05-28 DIAGNOSIS — M47817 Spondylosis without myelopathy or radiculopathy, lumbosacral region: Secondary | ICD-10-CM | POA: Diagnosis not present

## 2015-05-28 DIAGNOSIS — M961 Postlaminectomy syndrome, not elsewhere classified: Secondary | ICD-10-CM | POA: Diagnosis not present

## 2015-05-29 DIAGNOSIS — K5000 Crohn's disease of small intestine without complications: Secondary | ICD-10-CM | POA: Diagnosis not present

## 2015-05-29 DIAGNOSIS — K861 Other chronic pancreatitis: Secondary | ICD-10-CM | POA: Diagnosis not present

## 2015-06-07 DIAGNOSIS — G4730 Sleep apnea, unspecified: Secondary | ICD-10-CM | POA: Diagnosis not present

## 2015-06-09 DIAGNOSIS — G4730 Sleep apnea, unspecified: Secondary | ICD-10-CM | POA: Diagnosis not present

## 2015-06-24 DIAGNOSIS — G4733 Obstructive sleep apnea (adult) (pediatric): Secondary | ICD-10-CM | POA: Diagnosis not present

## 2015-07-11 DIAGNOSIS — K589 Irritable bowel syndrome without diarrhea: Secondary | ICD-10-CM | POA: Diagnosis not present

## 2015-07-11 DIAGNOSIS — K5000 Crohn's disease of small intestine without complications: Secondary | ICD-10-CM | POA: Diagnosis not present

## 2015-07-11 DIAGNOSIS — K859 Acute pancreatitis, unspecified: Secondary | ICD-10-CM | POA: Diagnosis not present

## 2015-07-11 DIAGNOSIS — K529 Noninfective gastroenteritis and colitis, unspecified: Secondary | ICD-10-CM | POA: Diagnosis not present

## 2015-07-11 DIAGNOSIS — Z7982 Long term (current) use of aspirin: Secondary | ICD-10-CM | POA: Diagnosis not present

## 2015-07-11 DIAGNOSIS — K861 Other chronic pancreatitis: Secondary | ICD-10-CM | POA: Diagnosis not present

## 2015-07-11 DIAGNOSIS — R52 Pain, unspecified: Secondary | ICD-10-CM | POA: Diagnosis not present

## 2015-07-11 DIAGNOSIS — K5090 Crohn's disease, unspecified, without complications: Secondary | ICD-10-CM | POA: Diagnosis not present

## 2015-07-11 DIAGNOSIS — E119 Type 2 diabetes mellitus without complications: Secondary | ICD-10-CM | POA: Diagnosis not present

## 2015-07-14 DIAGNOSIS — M545 Low back pain: Secondary | ICD-10-CM | POA: Diagnosis not present

## 2015-07-14 DIAGNOSIS — I10 Essential (primary) hypertension: Secondary | ICD-10-CM | POA: Diagnosis not present

## 2015-07-14 DIAGNOSIS — E1140 Type 2 diabetes mellitus with diabetic neuropathy, unspecified: Secondary | ICD-10-CM | POA: Diagnosis not present

## 2015-08-21 DIAGNOSIS — M961 Postlaminectomy syndrome, not elsewhere classified: Secondary | ICD-10-CM | POA: Diagnosis not present

## 2015-08-21 DIAGNOSIS — G894 Chronic pain syndrome: Secondary | ICD-10-CM | POA: Diagnosis not present

## 2015-08-21 DIAGNOSIS — Z79891 Long term (current) use of opiate analgesic: Secondary | ICD-10-CM | POA: Diagnosis not present

## 2015-09-07 DIAGNOSIS — Z23 Encounter for immunization: Secondary | ICD-10-CM | POA: Diagnosis not present

## 2015-09-10 DIAGNOSIS — M5416 Radiculopathy, lumbar region: Secondary | ICD-10-CM | POA: Diagnosis not present

## 2015-09-10 DIAGNOSIS — M5033 Other cervical disc degeneration, cervicothoracic region: Secondary | ICD-10-CM | POA: Diagnosis not present

## 2015-09-12 DIAGNOSIS — I10 Essential (primary) hypertension: Secondary | ICD-10-CM | POA: Diagnosis not present

## 2015-09-12 DIAGNOSIS — E1141 Type 2 diabetes mellitus with diabetic mononeuropathy: Secondary | ICD-10-CM | POA: Diagnosis not present

## 2015-09-12 DIAGNOSIS — Z1389 Encounter for screening for other disorder: Secondary | ICD-10-CM | POA: Diagnosis not present

## 2015-09-12 DIAGNOSIS — E1165 Type 2 diabetes mellitus with hyperglycemia: Secondary | ICD-10-CM | POA: Diagnosis not present

## 2015-09-12 DIAGNOSIS — Z0000 Encounter for general adult medical examination without abnormal findings: Secondary | ICD-10-CM | POA: Diagnosis not present

## 2015-09-25 DIAGNOSIS — K861 Other chronic pancreatitis: Secondary | ICD-10-CM | POA: Diagnosis not present

## 2015-09-25 DIAGNOSIS — K5000 Crohn's disease of small intestine without complications: Secondary | ICD-10-CM | POA: Diagnosis not present

## 2015-10-28 DIAGNOSIS — Z7951 Long term (current) use of inhaled steroids: Secondary | ICD-10-CM | POA: Diagnosis not present

## 2015-10-28 DIAGNOSIS — N529 Male erectile dysfunction, unspecified: Secondary | ICD-10-CM | POA: Diagnosis not present

## 2015-10-28 DIAGNOSIS — Z1211 Encounter for screening for malignant neoplasm of colon: Secondary | ICD-10-CM | POA: Diagnosis not present

## 2015-10-28 DIAGNOSIS — Z794 Long term (current) use of insulin: Secondary | ICD-10-CM | POA: Diagnosis not present

## 2015-10-28 DIAGNOSIS — Z7982 Long term (current) use of aspirin: Secondary | ICD-10-CM | POA: Diagnosis not present

## 2015-10-28 DIAGNOSIS — Z7984 Long term (current) use of oral hypoglycemic drugs: Secondary | ICD-10-CM | POA: Diagnosis not present

## 2015-10-28 DIAGNOSIS — K5080 Crohn's disease of both small and large intestine without complications: Secondary | ICD-10-CM | POA: Diagnosis not present

## 2015-10-28 DIAGNOSIS — E119 Type 2 diabetes mellitus without complications: Secondary | ICD-10-CM | POA: Diagnosis not present

## 2015-10-28 DIAGNOSIS — Z888 Allergy status to other drugs, medicaments and biological substances status: Secondary | ICD-10-CM | POA: Diagnosis not present

## 2015-10-28 DIAGNOSIS — Z79891 Long term (current) use of opiate analgesic: Secondary | ICD-10-CM | POA: Diagnosis not present

## 2015-10-28 DIAGNOSIS — K861 Other chronic pancreatitis: Secondary | ICD-10-CM | POA: Diagnosis not present

## 2015-10-28 DIAGNOSIS — K5010 Crohn's disease of large intestine without complications: Secondary | ICD-10-CM | POA: Diagnosis not present

## 2015-10-28 DIAGNOSIS — Z91041 Radiographic dye allergy status: Secondary | ICD-10-CM | POA: Diagnosis not present

## 2015-10-28 DIAGNOSIS — Z79899 Other long term (current) drug therapy: Secondary | ICD-10-CM | POA: Diagnosis not present

## 2015-11-17 DIAGNOSIS — I10 Essential (primary) hypertension: Secondary | ICD-10-CM | POA: Diagnosis not present

## 2015-11-17 DIAGNOSIS — E1141 Type 2 diabetes mellitus with diabetic mononeuropathy: Secondary | ICD-10-CM | POA: Diagnosis not present

## 2015-11-17 DIAGNOSIS — R5383 Other fatigue: Secondary | ICD-10-CM | POA: Diagnosis not present

## 2015-12-18 DIAGNOSIS — M50323 Other cervical disc degeneration at C6-C7 level: Secondary | ICD-10-CM | POA: Diagnosis not present

## 2015-12-18 DIAGNOSIS — Z79891 Long term (current) use of opiate analgesic: Secondary | ICD-10-CM | POA: Diagnosis not present

## 2015-12-18 DIAGNOSIS — G894 Chronic pain syndrome: Secondary | ICD-10-CM | POA: Diagnosis not present

## 2015-12-18 DIAGNOSIS — M961 Postlaminectomy syndrome, not elsewhere classified: Secondary | ICD-10-CM | POA: Diagnosis not present

## 2015-12-23 DIAGNOSIS — R1010 Upper abdominal pain, unspecified: Secondary | ICD-10-CM | POA: Diagnosis not present

## 2015-12-23 DIAGNOSIS — Z91041 Radiographic dye allergy status: Secondary | ICD-10-CM | POA: Diagnosis not present

## 2015-12-23 DIAGNOSIS — R1013 Epigastric pain: Secondary | ICD-10-CM | POA: Diagnosis not present

## 2015-12-23 DIAGNOSIS — K5090 Crohn's disease, unspecified, without complications: Secondary | ICD-10-CM | POA: Diagnosis not present

## 2015-12-23 DIAGNOSIS — Z9889 Other specified postprocedural states: Secondary | ICD-10-CM | POA: Diagnosis not present

## 2015-12-23 DIAGNOSIS — I469 Cardiac arrest, cause unspecified: Secondary | ICD-10-CM | POA: Diagnosis not present

## 2015-12-23 DIAGNOSIS — R079 Chest pain, unspecified: Secondary | ICD-10-CM | POA: Diagnosis not present

## 2015-12-23 DIAGNOSIS — R10816 Epigastric abdominal tenderness: Secondary | ICD-10-CM | POA: Diagnosis not present

## 2015-12-23 DIAGNOSIS — R071 Chest pain on breathing: Secondary | ICD-10-CM | POA: Diagnosis not present

## 2015-12-23 DIAGNOSIS — Z87891 Personal history of nicotine dependence: Secondary | ICD-10-CM | POA: Diagnosis not present

## 2015-12-23 DIAGNOSIS — K859 Acute pancreatitis without necrosis or infection, unspecified: Secondary | ICD-10-CM | POA: Diagnosis not present

## 2015-12-23 DIAGNOSIS — Z794 Long term (current) use of insulin: Secondary | ICD-10-CM | POA: Diagnosis not present

## 2015-12-23 DIAGNOSIS — E1165 Type 2 diabetes mellitus with hyperglycemia: Secondary | ICD-10-CM | POA: Diagnosis not present

## 2015-12-23 DIAGNOSIS — Z888 Allergy status to other drugs, medicaments and biological substances status: Secondary | ICD-10-CM | POA: Diagnosis not present

## 2015-12-23 DIAGNOSIS — G8929 Other chronic pain: Secondary | ICD-10-CM | POA: Diagnosis not present

## 2015-12-26 DIAGNOSIS — M961 Postlaminectomy syndrome, not elsewhere classified: Secondary | ICD-10-CM | POA: Diagnosis not present

## 2015-12-31 DIAGNOSIS — M4722 Other spondylosis with radiculopathy, cervical region: Secondary | ICD-10-CM | POA: Diagnosis not present

## 2015-12-31 DIAGNOSIS — M961 Postlaminectomy syndrome, not elsewhere classified: Secondary | ICD-10-CM | POA: Diagnosis not present

## 2016-01-07 DIAGNOSIS — M50323 Other cervical disc degeneration at C6-C7 level: Secondary | ICD-10-CM | POA: Diagnosis not present

## 2016-01-07 DIAGNOSIS — M50123 Cervical disc disorder at C6-C7 level with radiculopathy: Secondary | ICD-10-CM | POA: Diagnosis not present

## 2016-01-07 DIAGNOSIS — G894 Chronic pain syndrome: Secondary | ICD-10-CM | POA: Diagnosis not present

## 2016-01-07 DIAGNOSIS — M4722 Other spondylosis with radiculopathy, cervical region: Secondary | ICD-10-CM | POA: Diagnosis not present

## 2016-01-13 DIAGNOSIS — M4722 Other spondylosis with radiculopathy, cervical region: Secondary | ICD-10-CM | POA: Diagnosis not present

## 2016-01-13 DIAGNOSIS — M50123 Cervical disc disorder at C6-C7 level with radiculopathy: Secondary | ICD-10-CM | POA: Diagnosis not present

## 2016-01-27 DIAGNOSIS — M4722 Other spondylosis with radiculopathy, cervical region: Secondary | ICD-10-CM | POA: Diagnosis not present

## 2016-01-30 DIAGNOSIS — Z79899 Other long term (current) drug therapy: Secondary | ICD-10-CM | POA: Diagnosis not present

## 2016-01-30 DIAGNOSIS — Z7984 Long term (current) use of oral hypoglycemic drugs: Secondary | ICD-10-CM | POA: Diagnosis not present

## 2016-01-30 DIAGNOSIS — Z7982 Long term (current) use of aspirin: Secondary | ICD-10-CM | POA: Diagnosis not present

## 2016-01-30 DIAGNOSIS — Z91041 Radiographic dye allergy status: Secondary | ICD-10-CM | POA: Diagnosis not present

## 2016-01-30 DIAGNOSIS — E1142 Type 2 diabetes mellitus with diabetic polyneuropathy: Secondary | ICD-10-CM | POA: Diagnosis not present

## 2016-01-30 DIAGNOSIS — K5080 Crohn's disease of both small and large intestine without complications: Secondary | ICD-10-CM | POA: Diagnosis not present

## 2016-01-30 DIAGNOSIS — Z888 Allergy status to other drugs, medicaments and biological substances status: Secondary | ICD-10-CM | POA: Diagnosis not present

## 2016-01-30 DIAGNOSIS — G4730 Sleep apnea, unspecified: Secondary | ICD-10-CM | POA: Diagnosis not present

## 2016-01-30 DIAGNOSIS — K861 Other chronic pancreatitis: Secondary | ICD-10-CM | POA: Diagnosis not present

## 2016-01-30 DIAGNOSIS — Z7951 Long term (current) use of inhaled steroids: Secondary | ICD-10-CM | POA: Diagnosis not present

## 2016-01-30 DIAGNOSIS — K219 Gastro-esophageal reflux disease without esophagitis: Secondary | ICD-10-CM | POA: Diagnosis not present

## 2016-01-30 DIAGNOSIS — Z794 Long term (current) use of insulin: Secondary | ICD-10-CM | POA: Diagnosis not present

## 2016-02-10 DIAGNOSIS — M4722 Other spondylosis with radiculopathy, cervical region: Secondary | ICD-10-CM | POA: Diagnosis not present

## 2016-02-13 DIAGNOSIS — I10 Essential (primary) hypertension: Secondary | ICD-10-CM | POA: Diagnosis not present

## 2016-02-13 DIAGNOSIS — E1141 Type 2 diabetes mellitus with diabetic mononeuropathy: Secondary | ICD-10-CM | POA: Diagnosis not present

## 2016-02-13 DIAGNOSIS — G4733 Obstructive sleep apnea (adult) (pediatric): Secondary | ICD-10-CM | POA: Diagnosis not present

## 2016-02-13 DIAGNOSIS — H671 Otitis media in diseases classified elsewhere, right ear: Secondary | ICD-10-CM | POA: Diagnosis not present

## 2016-02-24 DIAGNOSIS — M4722 Other spondylosis with radiculopathy, cervical region: Secondary | ICD-10-CM | POA: Diagnosis not present

## 2016-02-24 DIAGNOSIS — M50123 Cervical disc disorder at C6-C7 level with radiculopathy: Secondary | ICD-10-CM | POA: Diagnosis not present

## 2016-02-28 DIAGNOSIS — M50123 Cervical disc disorder at C6-C7 level with radiculopathy: Secondary | ICD-10-CM | POA: Diagnosis not present

## 2016-03-02 ENCOUNTER — Ambulatory Visit: Payer: Self-pay | Admitting: Physician Assistant

## 2016-03-02 DIAGNOSIS — Z794 Long term (current) use of insulin: Secondary | ICD-10-CM | POA: Diagnosis not present

## 2016-03-02 DIAGNOSIS — Z7982 Long term (current) use of aspirin: Secondary | ICD-10-CM | POA: Diagnosis not present

## 2016-03-02 DIAGNOSIS — Z7951 Long term (current) use of inhaled steroids: Secondary | ICD-10-CM | POA: Diagnosis not present

## 2016-03-02 DIAGNOSIS — R112 Nausea with vomiting, unspecified: Secondary | ICD-10-CM | POA: Diagnosis not present

## 2016-03-02 DIAGNOSIS — Z87891 Personal history of nicotine dependence: Secondary | ICD-10-CM | POA: Diagnosis not present

## 2016-03-02 DIAGNOSIS — E119 Type 2 diabetes mellitus without complications: Secondary | ICD-10-CM | POA: Diagnosis not present

## 2016-03-02 DIAGNOSIS — K859 Acute pancreatitis without necrosis or infection, unspecified: Secondary | ICD-10-CM | POA: Diagnosis not present

## 2016-03-02 DIAGNOSIS — K861 Other chronic pancreatitis: Secondary | ICD-10-CM | POA: Diagnosis not present

## 2016-03-02 DIAGNOSIS — I10 Essential (primary) hypertension: Secondary | ICD-10-CM | POA: Diagnosis present

## 2016-03-02 DIAGNOSIS — Z91041 Radiographic dye allergy status: Secondary | ICD-10-CM | POA: Diagnosis not present

## 2016-03-02 DIAGNOSIS — Z79899 Other long term (current) drug therapy: Secondary | ICD-10-CM | POA: Diagnosis not present

## 2016-03-02 DIAGNOSIS — Z888 Allergy status to other drugs, medicaments and biological substances status: Secondary | ICD-10-CM | POA: Diagnosis not present

## 2016-03-02 DIAGNOSIS — M545 Low back pain: Secondary | ICD-10-CM | POA: Diagnosis present

## 2016-03-02 DIAGNOSIS — J45909 Unspecified asthma, uncomplicated: Secondary | ICD-10-CM | POA: Diagnosis present

## 2016-03-02 DIAGNOSIS — Z79891 Long term (current) use of opiate analgesic: Secondary | ICD-10-CM | POA: Diagnosis not present

## 2016-03-02 DIAGNOSIS — G8929 Other chronic pain: Secondary | ICD-10-CM | POA: Diagnosis present

## 2016-03-02 DIAGNOSIS — Z8261 Family history of arthritis: Secondary | ICD-10-CM | POA: Diagnosis not present

## 2016-03-02 DIAGNOSIS — Z832 Family history of diseases of the blood and blood-forming organs and certain disorders involving the immune mechanism: Secondary | ICD-10-CM | POA: Diagnosis not present

## 2016-03-02 DIAGNOSIS — K858 Other acute pancreatitis without necrosis or infection: Secondary | ICD-10-CM | POA: Diagnosis not present

## 2016-03-02 DIAGNOSIS — R1013 Epigastric pain: Secondary | ICD-10-CM | POA: Diagnosis not present

## 2016-03-02 DIAGNOSIS — N400 Enlarged prostate without lower urinary tract symptoms: Secondary | ICD-10-CM | POA: Diagnosis not present

## 2016-03-02 DIAGNOSIS — N200 Calculus of kidney: Secondary | ICD-10-CM | POA: Diagnosis not present

## 2016-03-08 ENCOUNTER — Inpatient Hospital Stay (HOSPITAL_COMMUNITY)
Admission: RE | Admit: 2016-03-08 | Discharge: 2016-03-08 | Disposition: A | Discharge status: Home / Self Care | Payer: Self-pay | Source: Ambulatory Visit

## 2016-03-08 ENCOUNTER — Encounter (HOSPITAL_COMMUNITY): Payer: Self-pay | Admitting: Vascular Surgery

## 2016-03-08 ENCOUNTER — Encounter (HOSPITAL_COMMUNITY): Payer: Self-pay

## 2016-03-08 HISTORY — DX: Other chronic pancreatitis: K86.1

## 2016-03-08 NOTE — Progress Notes (Signed)
Anesthesia Chart Review:  Pt is a 70 year old male scheduled for C6-7 ACDF on 03/17/2016 with Dr. Rolena Infante.   GI is Dr. Steward Drone (care everywhere) who has cleared pt for surgery.   PMH includes:  Pericarditis (prior to 2011), hyperlipidemia, DM, chronic pancreatitis. Former smoker. BMI 39  Medications include: ASA, lantus, apidra, pravastatin.   Pt did not show for his PAT appt 03/08/16.   Willeen Cass, FNP-BC [facility] Short Stay [facility]/Anesthesiology Phone: [phone] 03/08/2016 4:15 PM

## 2016-03-08 NOTE — Progress Notes (Signed)
Pt did not show for scheduled 3:00 P.M. PAT today. LVM on pt cell phone for return call, home phone number invalid.

## 2016-03-14 DIAGNOSIS — M542 Cervicalgia: Secondary | ICD-10-CM | POA: Diagnosis not present

## 2016-03-14 DIAGNOSIS — E43 Unspecified severe protein-calorie malnutrition: Secondary | ICD-10-CM | POA: Diagnosis not present

## 2016-03-14 DIAGNOSIS — K861 Other chronic pancreatitis: Secondary | ICD-10-CM | POA: Diagnosis not present

## 2016-03-14 DIAGNOSIS — E871 Hypo-osmolality and hyponatremia: Secondary | ICD-10-CM | POA: Diagnosis not present

## 2016-03-14 DIAGNOSIS — E1165 Type 2 diabetes mellitus with hyperglycemia: Secondary | ICD-10-CM | POA: Diagnosis not present

## 2016-03-14 DIAGNOSIS — R1013 Epigastric pain: Secondary | ICD-10-CM | POA: Diagnosis not present

## 2016-03-14 DIAGNOSIS — K91870 Postprocedural hematoma of a digestive system organ or structure following a digestive system procedure: Secondary | ICD-10-CM | POA: Diagnosis not present

## 2016-03-14 DIAGNOSIS — K5090 Crohn's disease, unspecified, without complications: Secondary | ICD-10-CM | POA: Diagnosis not present

## 2016-03-15 DIAGNOSIS — Z7984 Long term (current) use of oral hypoglycemic drugs: Secondary | ICD-10-CM | POA: Diagnosis not present

## 2016-03-15 DIAGNOSIS — Z9989 Dependence on other enabling machines and devices: Secondary | ICD-10-CM | POA: Diagnosis not present

## 2016-03-15 DIAGNOSIS — R000 Tachycardia, unspecified: Secondary | ICD-10-CM | POA: Diagnosis present

## 2016-03-15 DIAGNOSIS — M542 Cervicalgia: Secondary | ICD-10-CM | POA: Diagnosis not present

## 2016-03-15 DIAGNOSIS — E119 Type 2 diabetes mellitus without complications: Secondary | ICD-10-CM | POA: Diagnosis not present

## 2016-03-15 DIAGNOSIS — Z7982 Long term (current) use of aspirin: Secondary | ICD-10-CM | POA: Diagnosis not present

## 2016-03-15 DIAGNOSIS — K91870 Postprocedural hematoma of a digestive system organ or structure following a digestive system procedure: Secondary | ICD-10-CM | POA: Diagnosis present

## 2016-03-15 DIAGNOSIS — R933 Abnormal findings on diagnostic imaging of other parts of digestive tract: Secondary | ICD-10-CM | POA: Diagnosis not present

## 2016-03-15 DIAGNOSIS — E8809 Other disorders of plasma-protein metabolism, not elsewhere classified: Secondary | ICD-10-CM | POA: Diagnosis present

## 2016-03-15 DIAGNOSIS — Z794 Long term (current) use of insulin: Secondary | ICD-10-CM | POA: Diagnosis not present

## 2016-03-15 DIAGNOSIS — Z888 Allergy status to other drugs, medicaments and biological substances status: Secondary | ICD-10-CM | POA: Diagnosis not present

## 2016-03-15 DIAGNOSIS — E43 Unspecified severe protein-calorie malnutrition: Secondary | ICD-10-CM | POA: Diagnosis not present

## 2016-03-15 DIAGNOSIS — G8929 Other chronic pain: Secondary | ICD-10-CM | POA: Diagnosis not present

## 2016-03-15 DIAGNOSIS — K739 Chronic hepatitis, unspecified: Secondary | ICD-10-CM | POA: Diagnosis not present

## 2016-03-15 DIAGNOSIS — R1084 Generalized abdominal pain: Secondary | ICD-10-CM | POA: Diagnosis not present

## 2016-03-15 DIAGNOSIS — K861 Other chronic pancreatitis: Secondary | ICD-10-CM | POA: Diagnosis not present

## 2016-03-15 DIAGNOSIS — Z7951 Long term (current) use of inhaled steroids: Secondary | ICD-10-CM | POA: Diagnosis not present

## 2016-03-15 DIAGNOSIS — E1165 Type 2 diabetes mellitus with hyperglycemia: Secondary | ICD-10-CM | POA: Diagnosis present

## 2016-03-15 DIAGNOSIS — E781 Pure hyperglyceridemia: Secondary | ICD-10-CM | POA: Diagnosis present

## 2016-03-15 DIAGNOSIS — E871 Hypo-osmolality and hyponatremia: Secondary | ICD-10-CM | POA: Diagnosis not present

## 2016-03-15 DIAGNOSIS — K5090 Crohn's disease, unspecified, without complications: Secondary | ICD-10-CM | POA: Diagnosis not present

## 2016-03-15 DIAGNOSIS — K9289 Other specified diseases of the digestive system: Secondary | ICD-10-CM | POA: Diagnosis not present

## 2016-03-15 DIAGNOSIS — Z91041 Radiographic dye allergy status: Secondary | ICD-10-CM | POA: Diagnosis not present

## 2016-03-15 DIAGNOSIS — G4733 Obstructive sleep apnea (adult) (pediatric): Secondary | ICD-10-CM | POA: Diagnosis not present

## 2016-03-15 DIAGNOSIS — Z79899 Other long term (current) drug therapy: Secondary | ICD-10-CM | POA: Diagnosis not present

## 2016-03-15 DIAGNOSIS — D649 Anemia, unspecified: Secondary | ICD-10-CM | POA: Diagnosis not present

## 2016-03-15 DIAGNOSIS — D473 Essential (hemorrhagic) thrombocythemia: Secondary | ICD-10-CM | POA: Diagnosis not present

## 2016-03-15 DIAGNOSIS — E875 Hyperkalemia: Secondary | ICD-10-CM | POA: Diagnosis not present

## 2016-03-15 DIAGNOSIS — R590 Localized enlarged lymph nodes: Secondary | ICD-10-CM | POA: Diagnosis present

## 2016-03-15 DIAGNOSIS — K858 Other acute pancreatitis without necrosis or infection: Secondary | ICD-10-CM | POA: Diagnosis not present

## 2016-03-15 DIAGNOSIS — R1013 Epigastric pain: Secondary | ICD-10-CM | POA: Diagnosis not present

## 2016-03-15 DIAGNOSIS — D72829 Elevated white blood cell count, unspecified: Secondary | ICD-10-CM | POA: Diagnosis not present

## 2016-03-15 DIAGNOSIS — Z6833 Body mass index (BMI) 33.0-33.9, adult: Secondary | ICD-10-CM | POA: Diagnosis not present

## 2016-03-15 DIAGNOSIS — I10 Essential (primary) hypertension: Secondary | ICD-10-CM | POA: Diagnosis present

## 2016-03-15 DIAGNOSIS — S36420A Contusion of duodenum, initial encounter: Secondary | ICD-10-CM | POA: Diagnosis not present

## 2016-03-17 ENCOUNTER — Ambulatory Visit (HOSPITAL_COMMUNITY): Admission: RE | Admit: 2016-03-17 | Payer: Medicare Other | Source: Ambulatory Visit | Admitting: Orthopedic Surgery

## 2016-03-17 ENCOUNTER — Encounter (HOSPITAL_COMMUNITY): Admission: RE | Payer: Self-pay | Source: Ambulatory Visit

## 2016-03-17 SURGERY — ANTERIOR CERVICAL DECOMPRESSION/DISCECTOMY FUSION 1 LEVEL
Anesthesia: General

## 2016-03-25 DIAGNOSIS — R1013 Epigastric pain: Secondary | ICD-10-CM | POA: Diagnosis not present

## 2016-03-25 DIAGNOSIS — K861 Other chronic pancreatitis: Secondary | ICD-10-CM | POA: Diagnosis not present

## 2016-03-25 DIAGNOSIS — K5090 Crohn's disease, unspecified, without complications: Secondary | ICD-10-CM | POA: Diagnosis not present

## 2016-03-25 DIAGNOSIS — R109 Unspecified abdominal pain: Secondary | ICD-10-CM | POA: Diagnosis not present

## 2016-04-01 DIAGNOSIS — K858 Other acute pancreatitis without necrosis or infection: Secondary | ICD-10-CM | POA: Diagnosis not present

## 2016-04-02 DIAGNOSIS — M961 Postlaminectomy syndrome, not elsewhere classified: Secondary | ICD-10-CM | POA: Diagnosis not present

## 2016-04-02 DIAGNOSIS — Z79891 Long term (current) use of opiate analgesic: Secondary | ICD-10-CM | POA: Diagnosis not present

## 2016-04-02 DIAGNOSIS — G894 Chronic pain syndrome: Secondary | ICD-10-CM | POA: Diagnosis not present

## 2016-05-13 DIAGNOSIS — M542 Cervicalgia: Secondary | ICD-10-CM | POA: Diagnosis not present

## 2016-05-13 DIAGNOSIS — K861 Other chronic pancreatitis: Secondary | ICD-10-CM | POA: Diagnosis not present

## 2016-05-20 DIAGNOSIS — R197 Diarrhea, unspecified: Secondary | ICD-10-CM | POA: Diagnosis not present

## 2016-05-20 DIAGNOSIS — Z79899 Other long term (current) drug therapy: Secondary | ICD-10-CM | POA: Diagnosis not present

## 2016-05-20 DIAGNOSIS — K5000 Crohn's disease of small intestine without complications: Secondary | ICD-10-CM | POA: Diagnosis not present

## 2016-05-20 DIAGNOSIS — K5090 Crohn's disease, unspecified, without complications: Secondary | ICD-10-CM | POA: Diagnosis not present

## 2016-05-20 DIAGNOSIS — R1013 Epigastric pain: Secondary | ICD-10-CM | POA: Diagnosis not present

## 2016-05-20 DIAGNOSIS — K861 Other chronic pancreatitis: Secondary | ICD-10-CM | POA: Diagnosis not present

## 2016-05-25 DIAGNOSIS — M4722 Other spondylosis with radiculopathy, cervical region: Secondary | ICD-10-CM | POA: Diagnosis not present

## 2016-05-25 DIAGNOSIS — M50123 Cervical disc disorder at C6-C7 level with radiculopathy: Secondary | ICD-10-CM | POA: Diagnosis not present

## 2016-05-28 ENCOUNTER — Ambulatory Visit: Payer: Self-pay | Admitting: Physician Assistant

## 2016-06-11 DIAGNOSIS — M542 Cervicalgia: Secondary | ICD-10-CM | POA: Diagnosis not present

## 2016-06-22 DIAGNOSIS — M50123 Cervical disc disorder at C6-C7 level with radiculopathy: Secondary | ICD-10-CM | POA: Diagnosis not present

## 2016-06-22 NOTE — Pre-Procedure Instructions (Signed)
Micheal Parker  06/22/2016     No Pharmacies Listed   Your procedure is scheduled on Thursday, August 3rd, 2017.  Report to [facility] Admitting at 5:30 A.M.   Call this number if you have problems the morning of surgery:  [phone]   Remember:  Do not eat food or drink liquids after midnight.   Take these medicines the morning of surgery with A SIP OF WATER: Gabapentin (Neurontin), Methocarbamol (Robaxin),  Oxycodone-Acetaminophen (Percocet) if needed, Flonase if needed, Ranitidine (Zantac), Tamsulosin (Flomax).    WHAT DO I DO ABOUT MY DIABETES MEDICATION?  Marland Kitchen Do not take oral diabetes medicines (pills) the morning of surgery.  Do NOT take Metformin the morning of surgery.   . THE NIGHT BEFORE SURGERY, take 30 units of Lantus insulin.      . THE MORNING OF SURGERY, take 0 units of Humalog insulin.  . If your CBG is greater than 220 mg/dL, you may take  of your sliding scale (correction) dose of insulin.    Stop taking: Aspirin, NSAIDS, Aleve, Naproxen, Ibuprofen, Advil, Motrin, BC's, Goody's, Fish oil, all herbal medications, and all vitamins.    Do not wear jewelry.  Do not wear lotions, powders, or colognes.  You may wear deoderant.   Men may shave face and neck.  Do not bring valuables to the hospital.  [facility] is not responsible for any belongings or valuables.  Contacts, dentures or bridgework may not be worn into surgery.  Leave your suitcase in the car.  After surgery it may be brought to your room.  For patients admitted to the hospital, discharge time will be determined by your treatment team.  Patients discharged the day of surgery will not be allowed to drive home.   Special instructions:  Preparing for Surgery.   Please read over the following fact sheets that you were given. Blood Transfusion Information and MRSA Information     How to Manage Your Diabetes Before and After Surgery  Why is it important to control my blood sugar  before and after surgery? . Improving blood sugar levels before and after surgery helps healing and can limit problems. . A way of improving blood sugar control is eating a healthy diet by: o  Eating less sugar and carbohydrates o  Increasing activity/exercise o  Talking with your doctor about reaching your blood sugar goals . High blood sugars (greater than 180 mg/dL) can raise your risk of infections and slow your recovery, so you will need to focus on controlling your diabetes during the weeks before surgery. . Make sure that the doctor who takes care of your diabetes knows about your planned surgery including the date and location.  How do I manage my blood sugar before surgery? . Check your blood sugar at least 4 times a day, starting 2 days before surgery, to make sure that the level is not too high or low. o Check your blood sugar the morning of your surgery when you wake up and every 2 hours until you get to the Short Stay unit. . If your blood sugar is less than 70 mg/dL, you will need to treat for low blood sugar: o Do not take insulin. o Treat a low blood sugar (less than 70 mg/dL) with  cup of clear juice (cranberry or apple), 4 glucose tablets, OR glucose gel. o Recheck blood sugar in 15 minutes after treatment (to make sure it is greater than 70 mg/dL). If your blood  sugar is not greater than 70 mg/dL on recheck, call [phone] for further instructions. . Report your blood sugar to the short stay nurse when you get to Short Stay.  . If you are admitted to the hospital after surgery: o Your blood sugar will be checked by the staff and you will probably be given insulin after surgery (instead of oral diabetes medicines) to make sure you have good blood sugar levels. o The goal for blood sugar control after surgery is 80-180 mg/dL.   [place]- Preparing For Surgery  Before surgery, you can play an important role. Because skin is not sterile, your skin needs to be as free of  germs as possible. You can reduce the number of germs on your skin by washing with CHG (chlorahexidine gluconate) Soap before surgery.  CHG is an antiseptic cleaner which kills germs and bonds with the skin to continue killing germs even after washing.  Please do not use if you have an allergy to CHG or antibacterial soaps. If your skin becomes reddened/irritated stop using the CHG.  Do not shave (including legs and underarms) for at least 48 hours prior to first CHG shower. It is OK to shave your face.  Please follow these instructions carefully.   1. Shower the NIGHT BEFORE SURGERY and the MORNING OF SURGERY with CHG.   2. If you chose to wash your hair, wash your hair first as usual with your normal shampoo.  3. After you shampoo, rinse your hair and body thoroughly to remove the shampoo.  4. Use CHG as you would any other liquid soap. You can apply CHG directly to the skin and wash gently with a scrungie or a clean washcloth.   5. Apply the CHG Soap to your body ONLY FROM THE NECK DOWN.  Do not use on open wounds or open sores. Avoid contact with your eyes, ears, mouth and genitals (private parts). Wash genitals (private parts) with your normal soap.  6. Wash thoroughly, paying special attention to the area where your surgery will be performed.  7. Thoroughly rinse your body with warm water from the neck down.  8. DO NOT shower/wash with your normal soap after using and rinsing off the CHG Soap.  9. Pat yourself dry with a CLEAN TOWEL.   10. Wear CLEAN PAJAMAS   11. Place CLEAN SHEETS on your bed the night of your first shower and DO NOT SLEEP WITH PETS.  Day of Surgery: Do not apply any deodorants/lotions. Please wear clean clothes to the hospital/surgery center.

## 2016-06-23 ENCOUNTER — Encounter (HOSPITAL_COMMUNITY): Payer: Self-pay

## 2016-06-23 ENCOUNTER — Encounter (HOSPITAL_COMMUNITY)
Admission: RE | Admit: 2016-06-23 | Discharge: 2016-06-23 | Disposition: A | Discharge status: Home / Self Care | Payer: Medicare Other | Source: Ambulatory Visit | Attending: Orthopedic Surgery | Admitting: Orthopedic Surgery

## 2016-06-23 DIAGNOSIS — Z794 Long term (current) use of insulin: Secondary | ICD-10-CM | POA: Diagnosis not present

## 2016-06-23 DIAGNOSIS — R200 Anesthesia of skin: Secondary | ICD-10-CM

## 2016-06-23 DIAGNOSIS — E119 Type 2 diabetes mellitus without complications: Secondary | ICD-10-CM | POA: Diagnosis not present

## 2016-06-23 DIAGNOSIS — R202 Paresthesia of skin: Secondary | ICD-10-CM

## 2016-06-23 DIAGNOSIS — Z87891 Personal history of nicotine dependence: Secondary | ICD-10-CM | POA: Insufficient documentation

## 2016-06-23 DIAGNOSIS — Z79899 Other long term (current) drug therapy: Secondary | ICD-10-CM | POA: Diagnosis not present

## 2016-06-23 DIAGNOSIS — Z01818 Encounter for other preprocedural examination: Secondary | ICD-10-CM | POA: Insufficient documentation

## 2016-06-23 DIAGNOSIS — Z01812 Encounter for preprocedural laboratory examination: Secondary | ICD-10-CM | POA: Insufficient documentation

## 2016-06-23 DIAGNOSIS — Z7982 Long term (current) use of aspirin: Secondary | ICD-10-CM | POA: Diagnosis not present

## 2016-06-23 DIAGNOSIS — E785 Hyperlipidemia, unspecified: Secondary | ICD-10-CM | POA: Diagnosis not present

## 2016-06-23 DIAGNOSIS — N400 Enlarged prostate without lower urinary tract symptoms: Secondary | ICD-10-CM | POA: Insufficient documentation

## 2016-06-23 DIAGNOSIS — K5090 Crohn's disease, unspecified, without complications: Secondary | ICD-10-CM | POA: Diagnosis not present

## 2016-06-23 DIAGNOSIS — M50123 Cervical disc disorder at C6-C7 level with radiculopathy: Secondary | ICD-10-CM | POA: Insufficient documentation

## 2016-06-23 HISTORY — DX: Sleep apnea, unspecified: G47.30

## 2016-06-23 HISTORY — DX: Paresthesia of skin: R20.2

## 2016-06-23 HISTORY — DX: Crohn's disease, unspecified, without complications: K50.90

## 2016-06-23 HISTORY — DX: Presence of spectacles and contact lenses: Z97.3

## 2016-06-23 HISTORY — DX: Anesthesia of skin: R20.0

## 2016-06-23 HISTORY — DX: Unspecified osteoarthritis, unspecified site: M19.90

## 2016-06-23 LAB — CBC
HCT: 47.6 % (ref 39.0–52.0)
Hemoglobin: 15.3 g/dL (ref 13.0–17.0)
MCH: 28.5 pg (ref 26.0–34.0)
MCHC: 32.1 g/dL (ref 30.0–36.0)
MCV: 88.8 fL (ref 78.0–100.0)
PLATELETS: 236 10*3/uL (ref 150–400)
RBC: 5.36 MIL/uL (ref 4.22–5.81)
RDW: 15.4 % (ref 11.5–15.5)
WBC: 9.5 10*3/uL (ref 4.0–10.5)

## 2016-06-23 LAB — BASIC METABOLIC PANEL
ANION GAP: 5 (ref 5–15)
BUN: 8 mg/dL (ref 6–20)
CALCIUM: 9.1 mg/dL (ref 8.9–10.3)
CO2: 29 mmol/L (ref 22–32)
CREATININE: 1.2 mg/dL (ref 0.61–1.24)
Chloride: 101 mmol/L (ref 101–111)
GFR calc Af Amer: >60 mL/min (ref >60)
GLUCOSE: 151 mg/dL — AB (ref 65–99)
Potassium: 4.3 mmol/L (ref 3.5–5.1)
Sodium: 135 mmol/L (ref 135–145)

## 2016-06-23 LAB — SURGICAL PCR SCREEN
MRSA, PCR: NEGATIVE
STAPHYLOCOCCUS AUREUS: NEGATIVE

## 2016-06-23 LAB — GLUCOSE, CAPILLARY: GLUCOSE-CAPILLARY: 156 mg/dL — AB (ref 65–99)

## 2016-06-23 NOTE — Progress Notes (Signed)
PCP - Dr. Stoney Bang Cardiologist - denies  EKG - 06/23/16 CXR - denies  Echo - 2011 Stress test - 2007 Cardiac Cath - 03/2014  Patient denies chest pain and shortness of breath at PAT appointment.     Patient states that he checks his blood sugar 5-6 times daily and that his fasting glucose is usually 80-low 100's.  Patient expressed concerns about blood sugar dropping low the morning of surgery since he can not have anything to eat or drink.  Patient encouraged to eat a high protein snack before midnight the night prior and educated on how to treat a low blood sugar the morning of surgery.  Patient verbalized understanding.

## 2016-06-24 LAB — HEMOGLOBIN A1C
Hgb A1c MFr Bld: 7.6 % — ABNORMAL HIGH (ref 4.8–5.6)
MEAN PLASMA GLUCOSE: 171 mg/dL

## 2016-06-24 NOTE — Progress Notes (Signed)
Anesthesia Chart Review: Patient is a 70 year old male scheduled for C6-7 ACDF on 07/01/16 by Dr. Rolena Infante. Procedure was initially scheduled for 03/17/16, but was canceled/postponed for unclear reasons.  - PCP is Dr. Stoney Bang. Per 05/13/16 office visit, "Low risk for complications. Stable to undergo spinal surgery and epidural injection." - GI is Dr. Steward Drone [facility]; Care Everywhere) who has also cleared patient for surgery.  - He is not routinely followed by cardiology, but he did see Dr. Almira Coaster with [facility] in 2015 for evaluation of chest pain, and cardiac cath was done showing only mild CAD (see below).   PMH includes:  Pericarditis (prior to 2011), hyperlipidemia, DM2, chronic pancreatitis (no identifiable etiology; s/p celiac plexus block X 3, last [id number] complicated by hematoma), Crohn's disease, BPH.. Former smoker. BMI 35.71.  Medications include: ASA 81 mg, Flonase, gabapentin, Lantus, Xyzal, Amitiza, melatonin, Humalng, metformin, Viagra, Robaxin, Zantac, Flomax, pancelipase.  06/23/16 EKG: NSR.  04/15/14 Cardiac Cath ([facility]; Care Everywhere): Impression: 1. Mild nonobstructive coronary artery disease (30% mid LAD at the bifurcation with D2, 20% mid RCA). 2. Normal left ventricular systolic function. 3. Normal left ventricular end-diastolic pressure of 14 mm Hg. 4. Coronary angiography performed with access through the right radial artery. Plan: 1. Medical management of mild nonobstructive coronary artery disease. 2. Recommend weight loss.  Preoperative labs noted. A1c 7.6.  If no acute changes then I would anticipate that he could proceed as planned.  George Hugh [facility] Short Stay Center/Anesthesiology Phone [phone] 06/25/2016 7:27 AM

## 2016-06-30 MED ORDER — CEFAZOLIN SODIUM-DEXTROSE 2-4 GM/100ML-% IV SOLN
2.0000 g | INTRAVENOUS | Status: AC
  Administered 2016-07-01: 2 g via INTRAVENOUS
  Filled 2016-06-30: qty 100

## 2016-07-01 ENCOUNTER — Observation Stay (HOSPITAL_COMMUNITY)
Admission: RE | Admit: 2016-07-01 | Discharge: 2016-07-02 | Disposition: A | Discharge status: Home / Self Care | Payer: Medicare Other | Source: Ambulatory Visit | Attending: Orthopedic Surgery | Admitting: Orthopedic Surgery

## 2016-07-01 ENCOUNTER — Encounter (HOSPITAL_COMMUNITY): Payer: Self-pay | Admitting: Surgery

## 2016-07-01 ENCOUNTER — Ambulatory Visit (HOSPITAL_COMMUNITY): Payer: Medicare Other

## 2016-07-01 ENCOUNTER — Ambulatory Visit (HOSPITAL_COMMUNITY): Payer: Medicare Other | Admitting: Certified Registered Nurse Anesthetist

## 2016-07-01 ENCOUNTER — Observation Stay (HOSPITAL_COMMUNITY): Payer: Medicare Other

## 2016-07-01 ENCOUNTER — Ambulatory Visit (HOSPITAL_COMMUNITY): Payer: Medicare Other | Admitting: Vascular Surgery

## 2016-07-01 ENCOUNTER — Encounter (HOSPITAL_COMMUNITY)
Admission: RE | Disposition: A | Discharge status: Home / Self Care | Payer: Self-pay | Source: Ambulatory Visit | Attending: Orthopedic Surgery

## 2016-07-01 DIAGNOSIS — F17210 Nicotine dependence, cigarettes, uncomplicated: Secondary | ICD-10-CM | POA: Diagnosis not present

## 2016-07-01 DIAGNOSIS — Z981 Arthrodesis status: Secondary | ICD-10-CM | POA: Diagnosis not present

## 2016-07-01 DIAGNOSIS — E1142 Type 2 diabetes mellitus with diabetic polyneuropathy: Secondary | ICD-10-CM | POA: Diagnosis not present

## 2016-07-01 DIAGNOSIS — M961 Postlaminectomy syndrome, not elsewhere classified: Secondary | ICD-10-CM | POA: Insufficient documentation

## 2016-07-01 DIAGNOSIS — G894 Chronic pain syndrome: Secondary | ICD-10-CM | POA: Diagnosis not present

## 2016-07-01 DIAGNOSIS — K5090 Crohn's disease, unspecified, without complications: Secondary | ICD-10-CM | POA: Diagnosis not present

## 2016-07-01 DIAGNOSIS — M542 Cervicalgia: Secondary | ICD-10-CM | POA: Diagnosis present

## 2016-07-01 DIAGNOSIS — Z794 Long term (current) use of insulin: Secondary | ICD-10-CM | POA: Diagnosis not present

## 2016-07-01 DIAGNOSIS — M50323 Other cervical disc degeneration at C6-C7 level: Secondary | ICD-10-CM | POA: Diagnosis not present

## 2016-07-01 DIAGNOSIS — K861 Other chronic pancreatitis: Secondary | ICD-10-CM | POA: Insufficient documentation

## 2016-07-01 DIAGNOSIS — Z79899 Other long term (current) drug therapy: Secondary | ICD-10-CM | POA: Diagnosis not present

## 2016-07-01 DIAGNOSIS — M1990 Unspecified osteoarthritis, unspecified site: Secondary | ICD-10-CM | POA: Insufficient documentation

## 2016-07-01 DIAGNOSIS — M4722 Other spondylosis with radiculopathy, cervical region: Secondary | ICD-10-CM | POA: Diagnosis not present

## 2016-07-01 DIAGNOSIS — Z8249 Family history of ischemic heart disease and other diseases of the circulatory system: Secondary | ICD-10-CM | POA: Insufficient documentation

## 2016-07-01 DIAGNOSIS — N400 Enlarged prostate without lower urinary tract symptoms: Secondary | ICD-10-CM | POA: Diagnosis not present

## 2016-07-01 DIAGNOSIS — E785 Hyperlipidemia, unspecified: Secondary | ICD-10-CM | POA: Diagnosis not present

## 2016-07-01 DIAGNOSIS — M50123 Cervical disc disorder at C6-C7 level with radiculopathy: Secondary | ICD-10-CM | POA: Diagnosis not present

## 2016-07-01 DIAGNOSIS — Z419 Encounter for procedure for purposes other than remedying health state, unspecified: Secondary | ICD-10-CM

## 2016-07-01 DIAGNOSIS — M5412 Radiculopathy, cervical region: Secondary | ICD-10-CM | POA: Diagnosis not present

## 2016-07-01 HISTORY — PX: ANTERIOR CERVICAL DECOMP/DISCECTOMY FUSION: SHX1161

## 2016-07-01 LAB — GLUCOSE, CAPILLARY
GLUCOSE-CAPILLARY: 258 mg/dL — AB (ref 65–99)
GLUCOSE-CAPILLARY: 158 mg/dL — AB (ref 65–99)
GLUCOSE-CAPILLARY: 219 mg/dL — AB (ref 65–99)
Glucose-Capillary: 135 mg/dL — ABNORMAL HIGH (ref 65–99)

## 2016-07-01 SURGERY — ANTERIOR CERVICAL DECOMPRESSION/DISCECTOMY FUSION 1 LEVEL
Anesthesia: General

## 2016-07-01 MED ORDER — LACTATED RINGERS IV SOLN: INTRAVENOUS | Status: DC

## 2016-07-01 MED ORDER — PHENOL 1.4 % MT LIQD: 1.0000 | OROMUCOSAL | Status: DC | PRN

## 2016-07-01 MED ORDER — BUPIVACAINE-EPINEPHRINE (PF) 0.25% -1:200000 IJ SOLN
INTRAMUSCULAR | Status: AC
  Filled 2016-07-01: qty 30

## 2016-07-01 MED ORDER — PANCRELIPASE (LIP-PROT-AMYL) 12000-38000 UNITS PO CPEP
2.0000 | ORAL_CAPSULE | Freq: Three times a day (TID) | ORAL | Status: DC
  Administered 2016-07-01 – 2016-07-02 (×2): 24000 [IU] via ORAL
  Filled 2016-07-01 (×3): qty 2

## 2016-07-01 MED ORDER — ROCURONIUM BROMIDE 50 MG/5ML IV SOLN
INTRAVENOUS | Status: AC
  Filled 2016-07-01: qty 1

## 2016-07-01 MED ORDER — INSULIN ASPART 100 UNIT/ML ~~LOC~~ SOLN: 0.0000 [IU] | Freq: Three times a day (TID) | SUBCUTANEOUS | Status: DC

## 2016-07-01 MED ORDER — INSULIN ASPART 100 UNIT/ML ~~LOC~~ SOLN: 0.0000 [IU] | Freq: Every day | SUBCUTANEOUS | Status: DC

## 2016-07-01 MED ORDER — FENTANYL CITRATE (PF) 250 MCG/5ML IJ SOLN
INTRAMUSCULAR | Status: DC | PRN
  Administered 2016-07-01 (×2): 100 ug via INTRAVENOUS

## 2016-07-01 MED ORDER — INSULIN LISPRO 100 UNIT/ML ~~LOC~~ SOLN: 5.0000 [IU] | Freq: Three times a day (TID) | SUBCUTANEOUS | Status: DC

## 2016-07-01 MED ORDER — METHOCARBAMOL 500 MG PO TABS
ORAL_TABLET | ORAL | Status: AC
  Administered 2016-07-01: 500 mg via ORAL
  Filled 2016-07-01: qty 1

## 2016-07-01 MED ORDER — INSULIN GLARGINE 100 UNIT/ML ~~LOC~~ SOLN
50.0000 [IU] | Freq: Every day | SUBCUTANEOUS | Status: DC
  Administered 2016-07-01: 50 [IU] via SUBCUTANEOUS
  Filled 2016-07-01 (×3): qty 0.5

## 2016-07-01 MED ORDER — MIDAZOLAM HCL 2 MG/2ML IJ SOLN
INTRAMUSCULAR | Status: DC | PRN
  Administered 2016-07-01 (×2): 1 mg via INTRAVENOUS

## 2016-07-01 MED ORDER — GABAPENTIN 600 MG PO TABS: 300.0000 mg | ORAL_TABLET | Freq: Three times a day (TID) | ORAL | 0 refills | Status: DC

## 2016-07-01 MED ORDER — SODIUM CHLORIDE 0.9% FLUSH
3.0000 mL | Freq: Two times a day (BID) | INTRAVENOUS | Status: DC
  Administered 2016-07-01 (×3): 3 mL via INTRAVENOUS

## 2016-07-01 MED ORDER — FENTANYL CITRATE (PF) 250 MCG/5ML IJ SOLN
INTRAMUSCULAR | Status: AC
  Filled 2016-07-01: qty 5

## 2016-07-01 MED ORDER — SUCCINYLCHOLINE CHLORIDE 20 MG/ML IJ SOLN
INTRAMUSCULAR | Status: DC | PRN
  Administered 2016-07-01: 160 mg via INTRAVENOUS

## 2016-07-01 MED ORDER — GLYCOPYRROLATE 0.2 MG/ML IJ SOLN
INTRAMUSCULAR | Status: DC | PRN
  Administered 2016-07-01: 0.2 mg via INTRAVENOUS

## 2016-07-01 MED ORDER — HYDROMORPHONE HCL 1 MG/ML IJ SOLN
0.2500 mg | INTRAMUSCULAR | Status: DC | PRN
  Administered 2016-07-01 (×2): 0.5 mg via INTRAVENOUS

## 2016-07-01 MED ORDER — OXYCODONE-ACETAMINOPHEN 10-325 MG PO TABS: 1.0000 | ORAL_TABLET | ORAL | 0 refills | Status: DC | PRN

## 2016-07-01 MED ORDER — METHOCARBAMOL 500 MG PO TABS
500.0000 mg | ORAL_TABLET | Freq: Four times a day (QID) | ORAL | Status: DC | PRN
  Administered 2016-07-01 – 2016-07-02 (×3): 500 mg via ORAL
  Filled 2016-07-01 (×2): qty 1

## 2016-07-01 MED ORDER — PHENYLEPHRINE HCL 10 MG/ML IJ SOLN
INTRAMUSCULAR | Status: DC | PRN
  Administered 2016-07-01 (×2): 80 ug via INTRAVENOUS
  Administered 2016-07-01 (×2): 120 ug via INTRAVENOUS

## 2016-07-01 MED ORDER — ONDANSETRON HCL 4 MG PO TABS: 4.0000 mg | ORAL_TABLET | Freq: Three times a day (TID) | ORAL | 0 refills | Status: DC | PRN

## 2016-07-01 MED ORDER — ONDANSETRON HCL 4 MG/2ML IJ SOLN
INTRAMUSCULAR | Status: AC
  Filled 2016-07-01: qty 2

## 2016-07-01 MED ORDER — ARTIFICIAL TEARS OP OINT
TOPICAL_OINTMENT | OPHTHALMIC | Status: AC
  Filled 2016-07-01: qty 3.5

## 2016-07-01 MED ORDER — OXYCODONE HCL 5 MG PO TABS
ORAL_TABLET | ORAL | Status: AC
  Filled 2016-07-01: qty 2

## 2016-07-01 MED ORDER — SODIUM CHLORIDE 0.9% FLUSH: 3.0000 mL | INTRAVENOUS | Status: DC | PRN

## 2016-07-01 MED ORDER — HEMOSTATIC AGENTS (NO CHARGE) OPTIME
TOPICAL | Status: DC | PRN
  Administered 2016-07-01: 1 via TOPICAL

## 2016-07-01 MED ORDER — SUCCINYLCHOLINE CHLORIDE 200 MG/10ML IV SOSY
PREFILLED_SYRINGE | INTRAVENOUS | Status: AC
  Filled 2016-07-01: qty 10

## 2016-07-01 MED ORDER — METHOCARBAMOL 1000 MG/10ML IJ SOLN
500.0000 mg | Freq: Four times a day (QID) | INTRAMUSCULAR | Status: DC | PRN
  Filled 2016-07-01: qty 5

## 2016-07-01 MED ORDER — MIDAZOLAM HCL 2 MG/2ML IJ SOLN
INTRAMUSCULAR | Status: AC
  Filled 2016-07-01: qty 2

## 2016-07-01 MED ORDER — ONDANSETRON HCL 4 MG/2ML IJ SOLN
INTRAMUSCULAR | Status: DC | PRN
  Administered 2016-07-01: 4 mg via INTRAVENOUS

## 2016-07-01 MED ORDER — LEVOCETIRIZINE DIHYDROCHLORIDE 5 MG PO TABS: 5.0000 mg | ORAL_TABLET | Freq: Every evening | ORAL | Status: DC

## 2016-07-01 MED ORDER — SUGAMMADEX SODIUM 200 MG/2ML IV SOLN
INTRAVENOUS | Status: DC | PRN
  Administered 2016-07-01: 200 mg via INTRAVENOUS

## 2016-07-01 MED ORDER — GABAPENTIN 600 MG PO TABS
600.0000 mg | ORAL_TABLET | Freq: Four times a day (QID) | ORAL | Status: DC
  Administered 2016-07-01 – 2016-07-02 (×3): 600 mg via ORAL
  Filled 2016-07-01 (×3): qty 1

## 2016-07-01 MED ORDER — ACETAMINOPHEN 10 MG/ML IV SOLN
INTRAVENOUS | Status: DC | PRN
  Administered 2016-07-01: 1000 mg via INTRAVENOUS

## 2016-07-01 MED ORDER — PHENYLEPHRINE 40 MCG/ML (10ML) SYRINGE FOR IV PUSH (FOR BLOOD PRESSURE SUPPORT)
PREFILLED_SYRINGE | INTRAVENOUS | Status: AC
  Filled 2016-07-01: qty 10

## 2016-07-01 MED ORDER — ACETAMINOPHEN 10 MG/ML IV SOLN
INTRAVENOUS | Status: AC
  Filled 2016-07-01: qty 100

## 2016-07-01 MED ORDER — MORPHINE SULFATE (PF) 2 MG/ML IV SOLN
1.0000 mg | INTRAVENOUS | Status: DC | PRN
  Administered 2016-07-01: 4 mg via INTRAVENOUS
  Filled 2016-07-01: qty 2

## 2016-07-01 MED ORDER — THROMBIN 20000 UNITS EX SOLR
CUTANEOUS | Status: DC | PRN
  Administered 2016-07-01: 20000 [IU] via TOPICAL

## 2016-07-01 MED ORDER — LORATADINE 10 MG PO TABS
10.0000 mg | ORAL_TABLET | Freq: Every evening | ORAL | Status: DC
  Administered 2016-07-01: 10 mg via ORAL
  Filled 2016-07-01: qty 1

## 2016-07-01 MED ORDER — INSULIN ASPART 100 UNIT/ML ~~LOC~~ SOLN
0.0000 [IU] | SUBCUTANEOUS | Status: DC
  Administered 2016-07-01: 5 [IU] via SUBCUTANEOUS

## 2016-07-01 MED ORDER — ARTIFICIAL TEARS OP OINT
TOPICAL_OINTMENT | OPHTHALMIC | Status: DC | PRN
  Administered 2016-07-01: 1 via OPHTHALMIC

## 2016-07-01 MED ORDER — LIDOCAINE HCL (CARDIAC) 20 MG/ML IV SOLN
INTRAVENOUS | Status: DC | PRN
  Administered 2016-07-01: 60 mg via INTRATRACHEAL

## 2016-07-01 MED ORDER — BUPIVACAINE-EPINEPHRINE 0.25% -1:200000 IJ SOLN
INTRAMUSCULAR | Status: DC | PRN
  Administered 2016-07-01: 8 mg

## 2016-07-01 MED ORDER — 0.9 % SODIUM CHLORIDE (POUR BTL) OPTIME
TOPICAL | Status: DC | PRN
  Administered 2016-07-01: 1000 mL

## 2016-07-01 MED ORDER — CEFAZOLIN IN D5W 1 GM/50ML IV SOLN
1.0000 g | Freq: Three times a day (TID) | INTRAVENOUS | Status: AC
  Administered 2016-07-01 (×2): 1 g via INTRAVENOUS
  Filled 2016-07-01 (×2): qty 50

## 2016-07-01 MED ORDER — LIDOCAINE 2% (20 MG/ML) 5 ML SYRINGE
INTRAMUSCULAR | Status: AC
  Filled 2016-07-01: qty 5

## 2016-07-01 MED ORDER — INSULIN ASPART 100 UNIT/ML ~~LOC~~ SOLN
5.0000 [IU] | Freq: Every day | SUBCUTANEOUS | Status: DC
  Administered 2016-07-01: 5 [IU] via SUBCUTANEOUS

## 2016-07-01 MED ORDER — GLYCOPYRROLATE 0.2 MG/ML IV SOSY
PREFILLED_SYRINGE | INTRAVENOUS | Status: AC
  Filled 2016-07-01: qty 3

## 2016-07-01 MED ORDER — ONDANSETRON HCL 4 MG/2ML IJ SOLN: 4.0000 mg | INTRAMUSCULAR | Status: DC | PRN

## 2016-07-01 MED ORDER — OXYCODONE HCL 5 MG PO TABS
10.0000 mg | ORAL_TABLET | ORAL | Status: DC | PRN
  Administered 2016-07-01 – 2016-07-02 (×5): 10 mg via ORAL
  Filled 2016-07-01 (×4): qty 2

## 2016-07-01 MED ORDER — PROPOFOL 1000 MG/100ML IV EMUL
INTRAVENOUS | Status: AC
  Filled 2016-07-01: qty 100

## 2016-07-01 MED ORDER — INSULIN ASPART 100 UNIT/ML ~~LOC~~ SOLN: 10.0000 [IU] | Freq: Two times a day (BID) | SUBCUTANEOUS | Status: DC

## 2016-07-01 MED ORDER — METHOCARBAMOL 500 MG PO TABS: 500.0000 mg | ORAL_TABLET | Freq: Three times a day (TID) | ORAL | 0 refills | Status: DC | PRN

## 2016-07-01 MED ORDER — FLUTICASONE PROPIONATE 50 MCG/ACT NA SUSP
1.0000 | Freq: Every day | NASAL | Status: DC | PRN
  Administered 2016-07-01: 1 via NASAL
  Filled 2016-07-01: qty 16

## 2016-07-01 MED ORDER — PROPOFOL 500 MG/50ML IV EMUL
INTRAVENOUS | Status: DC | PRN
  Administered 2016-07-01: 25 ug/kg/min via INTRAVENOUS

## 2016-07-01 MED ORDER — PHENYLEPHRINE HCL 10 MG/ML IJ SOLN
INTRAMUSCULAR | Status: DC | PRN
  Administered 2016-07-01: 50 ug/min via INTRAVENOUS

## 2016-07-01 MED ORDER — HYDROMORPHONE HCL 1 MG/ML IJ SOLN
INTRAMUSCULAR | Status: AC
  Filled 2016-07-01: qty 1

## 2016-07-01 MED ORDER — METFORMIN HCL 500 MG PO TABS
1000.0000 mg | ORAL_TABLET | Freq: Two times a day (BID) | ORAL | Status: DC
  Administered 2016-07-01: 1000 mg via ORAL
  Filled 2016-07-01 (×2): qty 2

## 2016-07-01 MED ORDER — PROPOFOL 10 MG/ML IV BOLUS
INTRAVENOUS | Status: DC | PRN
Start: 2016-07-01 — End: 2016-07-01
  Administered 2016-07-01: 200 mg via INTRAVENOUS

## 2016-07-01 MED ORDER — THROMBIN 20000 UNITS EX SOLR
CUTANEOUS | Status: AC
  Filled 2016-07-01: qty 20000

## 2016-07-01 MED ORDER — TAMSULOSIN HCL 0.4 MG PO CAPS
0.4000 mg | ORAL_CAPSULE | Freq: Every day | ORAL | Status: DC
  Administered 2016-07-02: 0.4 mg via ORAL
  Filled 2016-07-01: qty 1

## 2016-07-01 MED ORDER — ROCURONIUM BROMIDE 100 MG/10ML IV SOLN
INTRAVENOUS | Status: DC | PRN
  Administered 2016-07-01: 30 mg via INTRAVENOUS
  Administered 2016-07-01: 50 mg via INTRAVENOUS
  Administered 2016-07-01: 20 mg via INTRAVENOUS

## 2016-07-01 MED ORDER — MENTHOL 3 MG MT LOZG: 1.0000 | LOZENGE | OROMUCOSAL | Status: DC | PRN

## 2016-07-01 MED ORDER — LUBIPROSTONE 24 MCG PO CAPS
24.0000 ug | ORAL_CAPSULE | Freq: Two times a day (BID) | ORAL | Status: DC
  Administered 2016-07-01 – 2016-07-02 (×2): 24 ug via ORAL
  Filled 2016-07-01 (×2): qty 1

## 2016-07-01 MED ORDER — LACTATED RINGERS IV SOLN
INTRAVENOUS | Status: DC | PRN
  Administered 2016-07-01 (×3): via INTRAVENOUS

## 2016-07-01 SURGICAL SUPPLY — 72 items
BIT DRILL 2.0 SM (BIT) ×1 IMPLANT
BIT DRILL 2.0MM SM (BIT) ×1
BIT DRILL SKYLINE 12MM (BIT) IMPLANT
BLADE SURG ROTATE 9660 (MISCELLANEOUS) IMPLANT
CANISTER SUCTION 2500CC (MISCELLANEOUS) ×3 IMPLANT
CHLORAPREP W/TINT 26ML (MISCELLANEOUS) ×2 IMPLANT
CLOSURE STERI-STRIP 1/2X4 (GAUZE/BANDAGES/DRESSINGS) ×1
CLOSURE WOUND 1/2 X4 (GAUZE/BANDAGES/DRESSINGS) ×1
CLSR STERI-STRIP ANTIMIC 1/2X4 (GAUZE/BANDAGES/DRESSINGS) ×2 IMPLANT
CORDS BIPOLAR (ELECTRODE) ×3 IMPLANT
COVER SURGICAL LIGHT HANDLE (MISCELLANEOUS) ×6 IMPLANT
CRADLE DONUT ADULT HEAD (MISCELLANEOUS) ×3 IMPLANT
DEVICE ENDSKLTN TC IMPLANT 8MM (Spacer) IMPLANT
DRAPE C-ARM 42X72 X-RAY (DRAPES) ×3 IMPLANT
DRAPE INCISE 23X17 IOBAN STRL (DRAPES) ×2
DRAPE INCISE 23X17 STRL (DRAPES) IMPLANT
DRAPE INCISE IOBAN 23X17 STRL (DRAPES) ×1 IMPLANT
DRAPE POUCH INSTRU U-SHP 10X18 (DRAPES) ×3 IMPLANT
DRAPE SURG 17X23 STRL (DRAPES) ×3 IMPLANT
DRAPE U-SHAPE 47X51 STRL (DRAPES) ×3 IMPLANT
DRILL BIT SKYLINE 12MM (BIT) ×3
ELECT COATED BLADE 2.86 ST (ELECTRODE) ×3 IMPLANT
ELECT PENCIL ROCKER SW 15FT (MISCELLANEOUS) ×3 IMPLANT
ELECT REM PT RETURN 9FT ADLT (ELECTROSURGICAL) ×3
ELECTRODE REM PT RTRN 9FT ADLT (ELECTROSURGICAL) ×1 IMPLANT
ENDOSKELETON TC IMPLANT 8MM (Spacer) ×3 IMPLANT
GLOVE BIO SURGEON STRL SZ 6.5 (GLOVE) ×2 IMPLANT
GLOVE BIO SURGEON STRL SZ7 (GLOVE) ×4 IMPLANT
GLOVE BIO SURGEONS STRL SZ 6.5 (GLOVE) ×1
GLOVE BIOGEL PI IND STRL 6.5 (GLOVE) ×1 IMPLANT
GLOVE BIOGEL PI IND STRL 8.5 (GLOVE) ×1 IMPLANT
GLOVE BIOGEL PI INDICATOR 6.5 (GLOVE) ×8
GLOVE BIOGEL PI INDICATOR 8.5 (GLOVE) ×2
GLOVE SS BIOGEL STRL SZ 8.5 (GLOVE) ×1 IMPLANT
GLOVE SUPERSENSE BIOGEL SZ 8.5 (GLOVE) ×2
GLOVE SURG SS PI 6.5 STRL IVOR (GLOVE) ×2 IMPLANT
GOWN STRL REUS W/ TWL LRG LVL3 (GOWN DISPOSABLE) ×1 IMPLANT
GOWN STRL REUS W/TWL 2XL LVL3 (GOWN DISPOSABLE) ×6 IMPLANT
GOWN STRL REUS W/TWL LRG LVL3 (GOWN DISPOSABLE) ×9
KIT BASIN OR (CUSTOM PROCEDURE TRAY) ×3 IMPLANT
KIT ROOM TURNOVER OR (KITS) ×3 IMPLANT
NDL SPNL 18GX3.5 QUINCKE PK (NEEDLE) ×1 IMPLANT
NEEDLE SPNL 18GX3.5 QUINCKE PK (NEEDLE) ×3 IMPLANT
NS IRRIG 1000ML POUR BTL (IV SOLUTION) ×3 IMPLANT
PACK ORTHO CERVICAL (CUSTOM PROCEDURE TRAY) ×3 IMPLANT
PACK UNIVERSAL I (CUSTOM PROCEDURE TRAY) ×3 IMPLANT
PAD ARMBOARD 7.5X6 YLW CONV (MISCELLANEOUS) ×6 IMPLANT
PATTIES SURGICAL .25X.25 (GAUZE/BANDAGES/DRESSINGS) IMPLANT
PIN DISTRACTION 14 (PIN) ×4 IMPLANT
PIN TEMP SKYLINE THREADED (PIN) ×2 IMPLANT
PLATE ONE LEVEL SKYLINE 16MM (Plate) ×2 IMPLANT
PUTTY BONE DBX 2.5 MIS (Bone Implant) ×2 IMPLANT
RESTRAINT LIMB HOLDER UNIV (RESTRAINTS) ×3 IMPLANT
SCREW RESCUE SKYLINE 16MM (Screw) ×4 IMPLANT
SCREW SKYLINE 14MM SD-VA (Screw) ×2 IMPLANT
SCREW SKYLINE 16MM (Screw) ×4 IMPLANT
SPONGE INTESTINAL PEANUT (DISPOSABLE) ×3 IMPLANT
SPONGE SURGIFOAM ABS GEL 100 (HEMOSTASIS) ×3 IMPLANT
STRIP CLOSURE SKIN 1/2X4 (GAUZE/BANDAGES/DRESSINGS) ×1 IMPLANT
SURGIFLO W/THROMBIN 8M KIT (HEMOSTASIS) IMPLANT
SUT BONE WAX W31G (SUTURE) ×3 IMPLANT
SUT MON AB 3-0 SH 27 (SUTURE) ×3
SUT MON AB 3-0 SH27 (SUTURE) ×1 IMPLANT
SUT VIC AB 2-0 CT1 18 (SUTURE) ×3 IMPLANT
SYR BULB IRRIGATION 50ML (SYRINGE) ×3 IMPLANT
SYR CONTROL 10ML LL (SYRINGE) ×3 IMPLANT
TAPE CLOTH 4X10 WHT NS (GAUZE/BANDAGES/DRESSINGS) ×3 IMPLANT
TAPE UMBILICAL COTTON 1/8X30 (MISCELLANEOUS) ×3 IMPLANT
TOWEL OR 17X24 6PK STRL BLUE (TOWEL DISPOSABLE) ×3 IMPLANT
TOWEL OR 17X26 10 PK STRL BLUE (TOWEL DISPOSABLE) ×7 IMPLANT
TRAY FOLEY CATH 16FRSI W/METER (SET/KITS/TRAYS/PACK) ×2 IMPLANT
WATER STERILE IRR 1000ML POUR (IV SOLUTION) ×3 IMPLANT

## 2016-07-01 NOTE — H&P (Signed)
History of Present Illness The patient is a 70 year old male who comes in today for a preoperative History and Physical. The patient is scheduled for a ACDF C3-6 to be performed by Dr. Duane Lope D. Rolena Infante, MD at [facility] on 07/01/16 . Please see the hospital record for complete dictated history and physical. Pt does carry a diagnosis of sleep apnea. Told him to discuss at his hospital cone visit as they may wish for him to bring some of his home equipment to the hospital.  Additional reasons for visit:  Transition into care is described as the following: The patient is transitioning into care and a summary of care was reviewed.   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 Heart Disease  Brother, Father, Mother. Congestive Heart Failure  Father, Mother. Diabetes Mellitus  Brother, Father. Depression  Sister. Hypertension  Brother, Father, Mother, Sister. Cancer  Sister.  Social History Tobacco / smoke exposure  04/24/2014: no Tobacco use  Never smoker. 04/24/2014 No history of drug/alcohol rehab  Not under pain contract  Living situation  live alone Exercise  Exercises never Children  0 Most recent primary occupation  Electrician Marital status  single Current work status  retired Current drinker  04/24/2014: Currently drinks beer only occasionally per week Number of flights of stairs before winded  2-3  Medication History  Repatha (140MG /ML Soln Pref Syr, Subcutaneous) Active. (1 q month) Vitamin B12 (1000MCG Tablet ER, Oral) Active. Potassium Chloride ER (10MEQ Tablet ER, Oral) Active. (QD) Ferrous Sulfate (324 (65 Fe)MG Tablet DR, Oral) Active. (QD) Repatha Pushtronex System (420MG /3.5ML Soln Cartridge, Subcutaneous)  Active. Furosemide Active. (20 MG QD) Diazepam (prn) Active. (5MG  PRN) Aspirin EC (325MG  Tablet DR, Oral) Active. (1 qd) Multivitamins (Oral) Active. Azithromycin (250MG  Tablet, Oral as needed) Active. ("I keep on hand for sinus infection") ProAir HFA (108 (90 Base)MCG/ACT Aerosol Soln, Inhalation) Active. (prn) Diazepam (5MG  Tablet, Oral) Active. (qhs prn) D3 Dots (2000UNIT Tablet Disperse, Oral) Active. (qd) Fish Oil Active. (qd) Benazepril HCl (20MG  Tablet, Oral) Active. (bid) TraMADol HCl (50MG  Tablet, Oral) Active. (PRN) Temazepam (15MG  Capsule, Oral) Active. (QHS) Medications Reconciled  Vitals  06/24/2016 9:10 AM Weight: 250 lb Height: 66.5in Body Surface Area: 2.21 m Body Mass Index: 39.75 kg/m  Temp.: 98.27F  Pulse: 73 (Regular)  BP: 135/85 (Sitting, Left Arm, Standard)  General General Appearance-Not in acute distress. Orientation-Oriented X3. Build & Nutrition-Well nourished and Well developed.  Integumentary General Characteristics Surgical Scars - no surgical scar evidence of previous cervical surgery. Cervical Spine-Skin examination of the cervical spine is without deformity, skin lesions, lacerations or abrasions.  Chest and Lung Exam Auscultation Breath sounds - Normal and Clear.  Cardiovascular Auscultation Rhythm - Regular rate and rhythm.  Peripheral Vascular Upper Extremity Palpation - Radial pulse - Bilateral - 2+.  Neurologic Sensation Upper Extremity - Bilateral - sensation is intact in the upper extremity. Reflexes Biceps Reflex - Bilateral - 2+. Brachioradialis Reflex - Bilateral - 2+. Triceps Reflex - Bilateral - 2+. Hoffman's Sign - Bilateral - Hoffman's sign not present.  Musculoskeletal Spine/Ribs/Pelvis  Cervical Spine : Inspection and Palpation - Tenderness - right cervical paraspinals tender to palpation, left cervical paraspinals tender to palpation, left trapezius tender to palpation and left  deltoid tender to palpation. Strength and Tone: Strength: Strength: Strength - Triceps -  Bilateral - 5/5. Right - 5/5. Deltoid - Left - 4-/5. Biceps - Left - 4-/5. Right - 5/5. Wrist Extension - Left - 4-/5. Right - 5/5. Hand Grip - Bilateral - 5/5. Heel walk - Bilateral - able to heel walk without difficulty. Toe Walk - Bilateral - able to walk on toes without difficulty. Heel-Toe Walk - Bilateral - able to heel-toe walk without difficulty. ROM - Flexion - Moderately Decreased and painful. Extension - Moderately Decreased and painful. Left Lateral Flexion - Moderately Decreased and painful. Right Lateral Flexion - Moderately Decreased and painful. Left Rotation - Moderately Decreased and painful. Right Rotation - Moderately Decreased and painful. Pain - . Cervical Spine - Special Testing - axial compression test negative, cross chest impingement test negative. Non-Anatomic Signs - No non-anatomic signs present. Upper Extremity Range of Motion - No truesholder pain with IR/ER of the shoulders.  Plan: Anterior cervical fusion:Risks of surgery include, but are not limited to: Throat pain, swallowing difficulty, hoarseness or change in voice, death, stroke, paralysis, nerve root damage/injury, bleeding, blood clots, loss of bowel/bladder control, hardware failure, or mal-position, spinal fluid leak, adjacent segment disease, non-union, need for further surgery, ongoing or worse pain, infection. Post-operative bleeding or swelling that could require emergent surgery. Goal Of Surgery: Discussed that goal of surgery is to reduce pain and improve function and quality of life. Patient is aware that despite all appropriate treatment that there pain and function could be the same, worse, or different.  Patient continues to have progressive neck pain despite injection therapy, physical therapy and activity modification.  As a result he has elected to move forward with surgery.  Clinically patient has 3 level  pathology and so will address with multi-level ACDF.

## 2016-07-01 NOTE — Brief Op Note (Signed)
07/01/2016  10:19 AM  PATIENT:  Micheal Parker  70 y.o. male  PRE-OPERATIVE DIAGNOSIS:  C6-7 DDD WITH C7 RADICULOPATHY  POST-OPERATIVE DIAGNOSIS:  C6-7 DDD WITH C7 RADICULOPATHY  PROCEDURE:  Procedure(s): ACDF C6-7 (N/A)  SURGEON:  Surgeon(s) and Role:    * Melina Schools, MD - Primary  PHYSICIAN ASSISTANT:   ASSISTANTS: Carmen Mayo   ANESTHESIA:   general  EBL:  Total I/O In: 1000 [I.V.:1000] Out: 25 [Blood:25]  BLOOD ADMINISTERED:none  DRAINS: none   LOCAL MEDICATIONS USED:  MARCAINE     SPECIMEN:  No Specimen  DISPOSITION OF SPECIMEN:  N/A  COUNTS:  YES  TOURNIQUET:  * No tourniquets in log *  DICTATION: .Other Dictation: Dictation Number [id number]  PLAN OF CARE: Admit for overnight observation  PATIENT DISPOSITION:  PACU - hemodynamically stable.

## 2016-07-01 NOTE — Discharge Instructions (Signed)

## 2016-07-01 NOTE — Progress Notes (Signed)
Placed patient on CPAP without complication. RT will continue to monitor as needed. 

## 2016-07-01 NOTE — Anesthesia Procedure Notes (Signed)
Procedure Name: Intubation Date/Time: 07/01/2016 7:34 AM Performed by: Collier Bullock Pre-anesthesia Checklist: Patient identified, Emergency Drugs available, Suction available and Patient being monitored Patient Re-evaluated:Patient Re-evaluated prior to inductionOxygen Delivery Method: Circle system utilized Preoxygenation: Pre-oxygenation with 100% oxygen Intubation Type: IV induction Ventilation: Oral airway inserted - appropriate to patient size and Mask ventilation with difficulty Laryngoscope Size: Glidescope (T4) Grade View: Grade I Tube type: Oral Tube size: 7.5 mm Number of attempts: 2 (DL by self, then MDA) Placement Confirmation: ETT inserted through vocal cords under direct vision,  positive ETCO2 and breath sounds checked- equal and bilateral Secured at: 23 cm Tube secured with: Tape Dental Injury: Teeth and Oropharynx as per pre-operative assessment and Bloody posterior oropharynx  Difficulty Due To: Difficulty was anticipated

## 2016-07-01 NOTE — H&P (Signed)
History of Present Illness  The patient is a 70 year old male who comes in today for a preoperative History and Physical. The patient is scheduled for a ACDF C6-7 to be performed by Dr. Duane Lope D. Rolena Infante, MD at [facility] on 07-01-16 . Please see the hospital record for complete dictated history and physical. Note for "H & P": The patient has an updated cervical MRI @ [facility] on 06-11-16. the pt carries a diagnosis of Crohns and chronic Pancreatitis. The pt takes enzymes before meals and says Pancreatitis is controlled. Crohns is currently in remission. pt also has DM type 2. He is on insulin therapy. Pts last A1c was around 6. Pt reports it is well controlled. The pt also has Sleep Apnea. Advised him to bring his mask to the hospital and discuss at preop.  Additional reasons for visit:  Transition into care is described as the following: The patient is transitioning into care and a summary of care was reviewed.   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 father, brother, grandmother mothers side, grandfather  mothers side, grandmother fathers side and grandfather fathers side Heart Disease  Father. Osteoarthritis  Brother, Father, Mother.  Social History Tobacco use  Former smoker. 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 (Dispense with [employer] Kit) Percocet (10-325MG Tablet, 1 Oral 1 tab po q 7 x daily, Taken starting 05/28/2016) Active. Robaxin (500MG Tablet, 1 (one) Oral four times daily, as needed, Taken starting 06/03/2016) Active. Neurontin (600MG Tablet, 1 Oral 1 tab po q qid, Taken starting 06/08/2016) Active. Amitiza (24MCG Capsule, Oral) Active. (bid) Anoro Ellipta (62.5-25MCG/INH Aero Pow Br Act, Inhalation) Active. (qd) Creon (24000UNIT Capsule DR Part, Oral) Active. (tid) DULoxetine HCl (30MG Capsule DR Part, Oral) Active. (bid) Fluticasone Propionate (50MCG/ACT Suspension, Nasal as needed) Active. (prn) ProAir HFA (108 (90 Base)MCG/ACT Aerosol Soln, Inhalation) Active. (prn) Fish Oil Concentrate (1000MG Capsule, 1 (one) Oral) Active. Vitamin B  Complex (  1 (one) Oral) Active. Vitamin D (400UNIT Capsule, 1 (one) Oral) Active. Flomax (0.4MG Capsule, Oral) Active. (qd) MetFORMIN HCl (1000MG Tablet, Oral) Active. (bid) HumaLOG (100UNIT/ML Solution, Subcutaneous) Active. (floating scale w/meals) Lantus (100UNIT/ML Solution, Subcutaneous) Active. (70 units qd) Testosterone Cypionate (250MG/ML Solution, Intramuscular) Active. (.5 q week) Zolpidem Tartrate (5MG Tablet, Oral) Active. (qhs) Medications Reconciled  Past Surgical History  Tonsillectomy  Spinal Surgery  Appendectomy  Anal Fissure Repair  Arthroscopy of Shoulder  right Spinal Decompression  lower back Rotator Cuff Repair  right  Other Problems Chronic Pain  Crohn's Disease  Diabetes Mellitus, Type II  Hypercholesterolemia  Peripheral Neuropathy  Prostate Disease   Vitals  06/22/2016 10:00 AM Weight: 214 lb Height: 64.5in Body Surface Area: 2.02 m Body Mass Index: 36.17 kg/m  Temp.: 98.96F(Oral)  BP: 128/68 (Sitting, Left Arm, Standard)  General General Appearance-Not in acute distress. Orientation-Oriented X3. Build & Nutrition-Well nourished and Well developed.  Integumentary General Characteristics Surgical Scars - surgical scarring consistent with previous lumbar surgery, no surgical scar evidence of previous cervical surgery. Cervical Spine-Skin examination of the cervical spine is without deformity, skin lesions, lacerations or abrasions.  Chest and Lung Exam Auscultation Breath sounds - Normal and Clear.  Cardiovascular Auscultation Rhythm - Regular rate and rhythm.  Peripheral Vascular Upper Extremity Palpation - Radial pulse - Bilateral - 2+.  Neurologic Sensation Upper Extremity - Bilateral - sensation is intact in the upper extremity. Reflexes Biceps Reflex - Bilateral - 2+. Brachioradialis Reflex - Bilateral - 2+. Triceps Reflex - Bilateral - 2+. Hoffman's Sign - Bilateral - Hoffman's sign not  present.  Musculoskeletal Spine/Ribs/Pelvis  Cervical Spine : Inspection and Palpation - Tenderness - right cervical paraspinals tender to palpation, left cervical paraspinals tender to palpation and right trapezius tender to palpation. Strength and Tone: Strength: Strength: Strength - Deltoid - Bilateral - 5/5. Biceps - Bilateral - 5/5. Right - 4-/5. Triceps - Left - 5/5. Wrist Extension - Bilateral - 5/5. Hand Grip - Bilateral - 5/5. Heel walk - Bilateral - able to heel walk without difficulty. Toe Walk - Bilateral - able to walk on toes without difficulty. Heel-Toe Walk - Bilateral - able to heel-toe walk without difficulty. ROM - Flexion - Moderately Decreased and painful. Extension - Moderately Decreased and painful. Left Lateral Flexion - Moderately Decreased and painful. Right Lateral Flexion - Moderately Decreased and painful. Left Rotation - Moderately Decreased and painful. Right Rotation - Moderately Decreased and painful. Pain - . Cervical Spine - Special Testing - axial compression test negative, cross chest impingement test negative. Non-Anatomic Signs - No non-anatomic signs present. Upper Extremity Range of Motion - No truesholder pain with IR/ER of the shoulders. Note: positive spurlings  Assessment & Plan Anterior cervical fusion:Risks of surgery include, but are not limited to: Throat pain, swallowing difficulty, hoarseness or change in voice, death, stroke, paralysis, nerve root damage/injury, bleeding, blood clots, loss of bowel/bladder control, hardware failure, or mal-position, spinal fluid leak, adjacent segment disease, non-union, need for further surgery, ongoing or worse pain, infection. Post-operative bleeding or swelling that could require emergent surgery. Goal Of Surgery: Discussed that goal of surgery is to reduce pain and improve function and quality of life. Patient is aware that despite all appropriate treatment that there pain and function could be the same,  worse, or different.   His MRI did not show any significant C6 neurocompression. He does have degenerative disease at C4-5 with foraminal stenosis, but he has no motor deficits in the deltoids and he  has no weakness or dysesthesias in the C5 distribution. At this point, I would favor just doing the single level ACDF at 6-7. This would address the C7 nerve pathology as evidenced by the tricep weakness, dysesthesias in the C7 distribution and the pain in the C7 distribution. We reviewed the risks, benefits and alternatives. I do think that postop he will have to see my partner, Dr. Caralyn Guile to discuss whether or not a carpal tunnel release will be required. At this point in time, all of his questions were addressed. I have gotten clearance from his GI doc secondary to the acute pancreatitis that he suffered in March and April and I have clearance from his regular primary care provider. At this point, we will move forward in a timely fashion.

## 2016-07-01 NOTE — Anesthesia Preprocedure Evaluation (Addendum)
Anesthesia Evaluation  Patient identified by MRN, date of birth, ID band Patient awake    Reviewed: Allergy & Precautions, NPO status , Patient's Chart, lab work & pertinent test results  Airway Mallampati: II  TM Distance: >3 FB Neck ROM: Full    Dental   Pulmonary shortness of breath, sleep apnea , former smoker,    breath sounds clear to auscultation       Cardiovascular negative cardio ROS   Rhythm:Regular Rate:Normal     Neuro/Psych    GI/Hepatic negative GI ROS, Neg liver ROS,   Endo/Other  diabetes  Renal/GU negative Renal ROS     Musculoskeletal  (+) Arthritis ,   Abdominal   Peds  Hematology   Anesthesia Other Findings   Reproductive/Obstetrics                            Anesthesia Physical Anesthesia Plan  ASA: III  Anesthesia Plan: General   Post-op Pain Management:    Induction: Intravenous  Airway Management Planned: Oral ETT and Video Laryngoscope Planned  Additional Equipment:   Intra-op Plan:   Post-operative Plan: Possible Post-op intubation/ventilation  Informed Consent: I have reviewed the patients History and Physical, chart, labs and discussed the procedure including the risks, benefits and alternatives for the proposed anesthesia with the patient or authorized representative who has indicated his/her understanding and acceptance.   Dental advisory given  Plan Discussed with: CRNA and Anesthesiologist  Anesthesia Plan Comments:         Anesthesia Quick Evaluation

## 2016-07-01 NOTE — Transfer of Care (Signed)
Immediate Anesthesia Transfer of Care Note  Patient: Micheal Parker  Procedure(s) Performed: Procedure(s): ACDF C6-7 (N/A)  Patient Location: PACU  Anesthesia Type:General  Level of Consciousness: awake, alert , oriented and patient cooperative  Airway & Oxygen Therapy: Patient Spontanous Breathing and Patient connected to face mask oxygen  Post-op Assessment: Report given to RN, Post -op Vital signs reviewed and stable, Patient moving all extremities X 4 and Patient able to stick tongue midline  Post vital signs: Reviewed and stable  Last Vitals:  Vitals:   07/01/16 0553 07/01/16 1039  BP: (!) 169/85 (!) 153/74  Pulse: 86 (P) 72  Resp: 20   Temp: 36.8 C (P) 36.7 C    Last Pain:  Vitals:   07/01/16 0610  TempSrc:   PainSc: 9          Complications: No apparent anesthesia complications

## 2016-07-01 NOTE — Op Note (Signed)
NAME:  Micheal Parker, Micheal Parker             ACCOUNT NO.:  [id number]  MEDICAL RECORD NO.:  [id number]  LOCATION:  [facility]                         FACILITY:  [facility]  PHYSICIAN:  Haylin Camilli D. Rolena Infante, M.D. 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  OPERATIVE PROCEDURE:  Anterior cervical diskectomy and fusion, C6-7.  SURGEON:  Ahmad Vanwey D. Rolena Infante, M.D.  FIRST ASSISTANT:  Ronette Deter, [place].  IMPLANT USED:  Size 8 large Titan nanoLOCK and cervical intervertebral cage packed with DBX mix with a DePuy anterior cervical size 16-mm Skyline plate affixed with 075-GRM screws into the bodies of C6 and C7.  COMPLICATIONS:  None.  CONDITION:  Stable.  HISTORY:  This is a very pleasant, 70 year old gentleman who has been complaining of severe neck and increasing right radicular arm pain. Clinical and radiographic analysis confirmed the diagnosis of degenerative cervical disk disease, with hard disk osteophyte and foraminal stenosis causing C7 neural compression.  After discussion we elected to proceed with surgery as he had significant pain without resolution with conservative measures.  Because of his underlying sleep apnea and age, we elected to do the one-level procedure at the hospital so he can close his postoperative observation.  OPERATIVE NOTE:  The patient was brought to the operating room and placed supine on the operating table.  After successful induction of general anesthesia and endotracheal intubation, TEDs, SCDs were applied. The anterior cervical spine was prepped and draped in a standard fashion.  Time-out was  taken confirming the patient, procedure, and all other pertinent important data.  Once this was complete, fluoro was used to identify the C6-7 level.  The skin incision was marked out and infiltrated with 0.25% Marcaine.  I then sharply dissected down to the deep fascia and the deep fascia was sharply incised.  The external jugular was quite prominent and so I dissected both medial and lateral so I could allow for mobilization.  Once I had the external jugular easily mobilize, I continued along the medial border of the sternocleidomastoid dissecting into the deep cervical fascia.  I identified the omohyoid, isolated and transected it in order to allow for better visualization and retraction.  I then was able to palpate the carotid pulse and then I swept the esophagus and trachea off to the right and protected with a Thyroid retractor.  I then used Kittner dissectors to dissect through the remaining prevertebral fascia and exposed the C6-7 disk space.  A needle was placed into the disk space, and an x-ray was taken to confirm that I was at the appropriate level.  Once the appropriate level was confirmed, I then mobilized the longus colli muscle from the midbody of C6 to the midbody of C7.  I then removed the large exostosis from the anterior aspect of this disk space with a Kerrison rongeur.  I then placed self-retaining retractors underneath the longus colli  muscle, deflated the endotracheal cuff, expanded the retractor and then reinflated the cuff.  I then placed distraction pins in the midportion of the body of 6 and 7 and then started performing a diskectomy.  Using pituitary rongeurs and curettes, I removed the bulk of the disk material.  I then distracted the intervertebral space and then maintained distraction with the retracting pins.  I then continued dissecting down until I encountered the overlapping significant osteophyte from the posterior aspect of the vertebral bodies.  At  this point, I used a high-speed bur to thin down the posterior osteophytes on 6 and 7 and then used a 1 mm Kerrison punch to remove it.  This greatly enhanced my visualization posterior.  I then used my fine nerve hook to develop a plane underneath the posterior longitudinal ligament, and this allowed me to resect using a 1-mm Kerrison the remaining posterior disk anulus and PLL.  Once this was out, I was able to get underneath the uncovertebral joint and resect this to completely decompress the exiting 7 nerve root.  At this point, I had excellent decompression.  The disk space was now recreated.  In an AP plane, I had excellent uncovertebral joint separation (decompression).  At this point, I trialed intervertebral spaces and used the large size 8 Titan nanoLOCK intervertebral cage packed with DBX mix.  I did rasp to ensure I had bleeding subchondral bone before placing the cage.  Once the cage was seated, I then obtained the 16-mm anterior cervical plate.  I then affixed it to the vertebral bodies with 16-mm screws.  The right C6 and left C7 screws were both rescued as I did not get good purchase secondary to the fact that the screw took an adverse path into the head, they did not have excellent purchase and so I repositioned with breast tissues and I did get excellent purchase.  The wound was copiously irrigated with normal saline.  All retractors were removed and the final locking mechanism was engaged per [employer].  Once all 4 screws were locked in place, I irrigated the wound copiously with normal saline and made sure I had hemostasis.  At the end of the case, all needle and sponge counts were correct.  There were no adverse intraoperative events.  The latissimus was reapproximated with 2-0 interrupted Vicryl sutures and the skin was reapproximated with 3-0 Monocryl.  Steri-Strips and a dry dressing were applied, and the patient was ultimately extubated and  transferred to the PACU without incident.  At the end of the case, needle and sponge counts were correct.  No adverse intraoperative events.     Micheal Parker D. Rolena Infante, M.D.     DDB/MEDQ  D:  07/01/2016  T:  07/01/2016  Job:  [id number]

## 2016-07-02 ENCOUNTER — Encounter (HOSPITAL_COMMUNITY): Payer: Self-pay | Admitting: Orthopedic Surgery

## 2016-07-02 DIAGNOSIS — M50123 Cervical disc disorder at C6-C7 level with radiculopathy: Secondary | ICD-10-CM | POA: Diagnosis not present

## 2016-07-02 DIAGNOSIS — K861 Other chronic pancreatitis: Secondary | ICD-10-CM | POA: Diagnosis not present

## 2016-07-02 DIAGNOSIS — K5090 Crohn's disease, unspecified, without complications: Secondary | ICD-10-CM | POA: Diagnosis not present

## 2016-07-02 DIAGNOSIS — M961 Postlaminectomy syndrome, not elsewhere classified: Secondary | ICD-10-CM | POA: Diagnosis not present

## 2016-07-02 DIAGNOSIS — G894 Chronic pain syndrome: Secondary | ICD-10-CM | POA: Diagnosis not present

## 2016-07-02 DIAGNOSIS — F17210 Nicotine dependence, cigarettes, uncomplicated: Secondary | ICD-10-CM | POA: Diagnosis not present

## 2016-07-02 LAB — GLUCOSE, CAPILLARY: Glucose-Capillary: 129 mg/dL — ABNORMAL HIGH (ref 65–99)

## 2016-07-02 LAB — HEMOGLOBIN A1C
HEMOGLOBIN A1C: 7.5 % — AB (ref 4.8–5.6)
Mean Plasma Glucose: 169 mg/dL

## 2016-07-02 NOTE — Progress Notes (Signed)
    Subjective: Procedure(s) (LRB): ACDF C6-7 (N/A) 1 Day Post-Op  Patient reports pain as 2 on 0-10 scale.  Reports decreased arm pain reports incisional neck pain   Positive void Negative bowel movement Positive flatus Negative chest pain or shortness of breath  Objective: Vital signs in last 24 hours: Temp:  [97.6 F (36.4 C)-98.3 F (36.8 C)] 97.6 F (36.4 C) (08/04 0324) Pulse Rate:  [64-98] 76 (08/04 0324) Resp:  [10-20] 20 (08/04 0324) BP: (124-167)/(69-92) 124/69 (08/04 0324) SpO2:  [94 %-98 %] 97 % (08/04 0324)  Intake/Output from previous day: 08/03 0701 - 08/04 0700 In: 2480 [P.O.:480; I.V.:2000] Out: 1250 [Urine:1200; Blood:50]  Labs: No results for input(s): WBC, RBC, HCT, PLT in the last 72 hours. No results for input(s): NA, K, CL, CO2, BUN, CREATININE, GLUCOSE, CALCIUM in the last 72 hours. No results for input(s): LABPT, INR in the last 72 hours.  Physical Exam: Neurologically intact ABD soft Neurovascular intact Intact pulses distally Incision: dressing C/D/I Compartment soft  Assessment/Plan: Patient stable  xrays satisfactory Mobilization with physical therapy Encourage incentive spirometry Continue care  Advance diet D/C IV fluids  Plan on d/c to home today  Melina Schools, MD [place] [phone]

## 2016-07-02 NOTE — Progress Notes (Signed)
Patient alert and oriented, mae's well, voiding adequate amount of urine, swallowing without difficulty, no c/o pain. Patient discharged home with family. Script and discharged instructions given to patient. Patient and family stated understanding of d/c instructions given and has an appointment with MD. 

## 2016-07-02 NOTE — Evaluation (Signed)
Occupational Therapy Evaluation Patient Details Name: Micheal Parker MRN: [id number] DOB: 11/18/1946 Today's Date: 07/02/2016    History of Present Illness 70 yo male s/p ACDF  C6-7 PMH: pericarditis, pancreatits, Crohn's disease,      Clinical Impression  Patient evaluated by Occupational Therapy with no further acute OT needs identified. All education has been completed and the patient has no further questions. See below for any follow-up Occupational Therapy or equipment needs. OT to sign off. Thank you for referral.      Follow Up Recommendations  No OT follow up    Equipment Recommendations  None recommended by OT    Recommendations for Other Services       Precautions / Restrictions Precautions Precautions: Cervical Precaution Comments: handout provided and reviewed in detail for neck brace and adls Required Braces or Orthoses: Cervical Brace Cervical Brace: Hard collar;At all times;Other (comment) (off for shower and supine laying per dr Rolena Infante)      Mobility Bed Mobility               General bed mobility comments: in hallway on arrival  Transfers Overall transfer level: Modified independent                    Balance Overall balance assessment: No apparent balance deficits (not formally assessed)                                          ADL Overall ADL's : At baseline                                       General ADL Comments: pt able to reach bil LE, shower transfer, don doff brace, educated on adjusting / washing c collar, and reaching precautions     Vision Vision Assessment?: No apparent visual deficits   Perception     Praxis      Pertinent Vitals/Pain Pain Assessment: No/denies pain     Hand Dominance Right   Extremity/Trunk Assessment Upper Extremity Assessment Upper Extremity Assessment: Overall WFL for tasks assessed (reports R arm symtpoms resolved)   Lower Extremity  Assessment Lower Extremity Assessment: Defer to PT evaluation   Cervical / Trunk Assessment Cervical / Trunk Assessment: Other exceptions (s/p ACDF)   Communication Communication Communication: No difficulties   Cognition Arousal/Alertness: Awake/alert Behavior During Therapy: WFL for tasks assessed/performed Overall Cognitive Status: Within Functional Limits for tasks assessed                     General Comments       Exercises       Shoulder Instructions      Home Living Family/patient expects to be discharged to:: Private residence Living Arrangements: Spouse/significant other;Parent Available Help at Discharge: Family Type of Home: House Home Access: Stairs to enter Technical brewer of Steps: 2   Home Layout: Multi-level;Able to live on main level with bedroom/bathroom     Bathroom Shower/Tub: Occupational psychologist: Standard     Home Equipment: Environmental consultant - 2 wheels;Bedside commode;Shower seat;Hand held shower head;Adaptive equipment;Grab bars - tub/shower Adaptive Equipment: Reacher Additional Comments: has wife and 71 yo mother within the home.       Prior Functioning/Environment Level of Independence: Independent  OT Diagnosis:     OT Problem List:     OT Treatment/Interventions:      OT Goals(Current goals can be found in the care plan section)    OT Frequency:     Barriers to D/C:            Co-evaluation              End of Session Equipment Utilized During Treatment: Cervical collar Nurse Communication: Mobility status;Precautions  Activity Tolerance: Patient tolerated treatment well Patient left: in chair;with call bell/phone within reach   Time: 0757-0818 OT Time Calculation (min): 21 min Charges:  OT General Charges $OT Visit: 1 Procedure OT Evaluation $OT Eval Moderate Complexity: 1 Procedure G-Codes: OT G-codes **NOT FOR INPATIENT CLASS** Functional Assessment Tool Used: clinical  judgement Functional Limitation: Self care Self Care Current Status [id number]): 0 percent impaired, limited or restricted Self Care Goal Status [id number]): 0 percent impaired, limited or restricted Self Care Discharge Status [id number]): 0 percent impaired, limited or restricted  Parke Poisson B 07/02/2016, 9:06 AM   Jeri Modena   OTR/L Pager: [phone] Office: [phone] .

## 2016-07-02 NOTE — Evaluation (Signed)
Physical Therapy Evaluation Patient Details Name: Micheal Parker MRN: [id number] DOB: 1946/09/23 Today's Date: 07/02/2016   History of Present Illness  70 yo male s/p ACDF  C6-7 PMH: pericarditis, pancreatits, Crohn's disease,   Clinical Impression  Patient seen for mobility assessment and education s/p cervical surgery. Patient mobilizing well, able to perform stair negotiation without difficulty. Receptive to education for car transfers and mobility expectations. No further acute PT needs. Will sign off.    Follow Up Recommendations No PT follow up    Equipment Recommendations  None recommended by PT    Recommendations for Other Services       Precautions / Restrictions Precautions Precautions: Cervical Precaution Comments: handout provided and reviewed in detail for neck brace and adls Required Braces or Orthoses: Cervical Brace Cervical Brace: Hard collar;At all times;Other (comment) (off for shower and supine laying per dr Rolena Infante) Restrictions Weight Bearing Restrictions: No      Mobility  Bed Mobility               General bed mobility comments: received in chair  Transfers Overall transfer level: Modified independent                  Ambulation/Gait Ambulation/Gait assistance: Independent Ambulation Distance (Feet): 240 Feet Assistive device: None Gait Pattern/deviations: WFL(Within Functional Limits)        Stairs Stairs: Yes Stairs assistance: Supervision Stair Management: One rail Left Number of Stairs: 4 General stair comments: No physical assist, educated on blind technique  Wheelchair Mobility    Modified Rankin (Stroke Patients Only)       Balance Overall balance assessment: No apparent balance deficits (not formally assessed)                                           Pertinent Vitals/Pain Pain Assessment: No/denies pain    Home Living Family/patient expects to be discharged to:: Private  residence Living Arrangements: Spouse/significant other;Parent Available Help at Discharge: Family Type of Home: House Home Access: Stairs to enter   Technical brewer of Steps: 2 Home Layout: Multi-level;Able to live on main level with bedroom/bathroom Home Equipment: Gilford Rile - 2 wheels;Bedside commode;Shower seat;Hand held shower head;Adaptive equipment;Grab bars - tub/shower Additional Comments: has wife and 64 yo mother within the home.     Prior Function Level of Independence: Independent               Hand Dominance   Dominant Hand: Right    Extremity/Trunk Assessment   Upper Extremity Assessment: Overall WFL for tasks assessed (reports R arm symtpoms resolved)           Lower Extremity Assessment: Overall WFL for tasks assessed      Cervical / Trunk Assessment: Other exceptions (s/p ACDF)  Communication   Communication: No difficulties  Cognition Arousal/Alertness: Awake/alert Behavior During Therapy: WFL for tasks assessed/performed Overall Cognitive Status: Within Functional Limits for tasks assessed                      General Comments General comments (skin integrity, edema, etc.): dressing dry and intact. Educated to avoid washing directly over incision site    Exercises        Assessment/Plan    PT Assessment Patent does not need any further PT services  PT Diagnosis Acute pain   PT Problem List    PT Treatment Interventions  PT Goals (Current goals can be found in the Care Plan section) Acute Rehab PT Goals PT Goal Formulation: All assessment and education complete, DC therapy    Frequency     Barriers to discharge        Co-evaluation               End of Session   Activity Tolerance: Patient tolerated treatment well Patient left: in chair;with family/visitor present Nurse Communication: Mobility status    Functional Assessment Tool Used: clinical judgement Functional Limitation: Mobility: Walking and  moving around Mobility: Walking and Moving Around Current Status [id number]): At least 1 percent but less than 20 percent impaired, limited or restricted Mobility: Walking and Moving Around Goal Status [phone]): At least 1 percent but less than 20 percent impaired, limited or restricted Mobility: Walking and Moving Around Discharge Status [phone]): At least 1 percent but less than 20 percent impaired, limited or restricted    Time: 0821-0833 PT Time Calculation (min) (ACUTE ONLY): 12 min   Charges:   PT Evaluation $PT Eval Low Complexity: 1 Procedure     PT G Codes:   PT G-Codes **NOT FOR INPATIENT CLASS** Functional Assessment Tool Used: clinical judgement Functional Limitation: Mobility: Walking and moving around Mobility: Walking and Moving Around Current Status [id number]): At least 1 percent but less than 20 percent impaired, limited or restricted Mobility: Walking and Moving Around Goal Status [phone]): At least 1 percent but less than 20 percent impaired, limited or restricted Mobility: Walking and Moving Around Discharge Status [phone]): At least 1 percent but less than 20 percent impaired, limited or restricted    Duncan Dull 07/02/2016, 9:14 AM Alben Deeds, PT DPT  [phone]

## 2016-07-06 NOTE — Discharge Summary (Signed)
Physician Discharge Summary  Patient ID: Micheal Parker MRN: [id number] DOB/AGE: [birth year] 70 y.o.  Admit date: 07/01/2016 Discharge date: 07/06/2016  Admission Diagnoses:  Cervical DDD  Discharge Diagnoses:  Active Problems:   Neck pain   Past Medical History:  Diagnosis Date  . Arthritis   . BPH (benign prostatic hypertrophy)   . Chronic back pain   . Chronic neck pain   . Chronic pancreatitis ([place])   . Confusion   . Crohn's disease ([place])    "in remission"  . Hyperlipidemia   . Numbness and tingling 06/23/2016   right arm and left leg  . Pericarditis    "not a problem now"  . Shortness of breath    "no problems now"  . Sleep apnea   . Type II diabetes mellitus ([facility])    Type 2  . Wears glasses     Surgeries: Procedure(s): ACDF C6-7 on 07/01/2016   Consultants (if any):   Discharged Condition: Improved  Hospital Course: Micheal Parker is an 70 y.o. male who was admitted 07/01/2016 with a diagnosis of Cervical DDD and went to the operating room on 07/01/2016 and underwent the above named procedures.  Pt discharged on 07/02/16.  He was given perioperative antibiotics:  Anti-infectives    Start     Dose/Rate Route Frequency Ordered Stop   07/01/16 1530  ceFAZolin (ANCEF) IVPB 1 g/50 mL premix     1 g 100 mL/hr over 30 Minutes Intravenous Every 8 hours 07/01/16 1514 07/01/16 2312   07/01/16 0700  ceFAZolin (ANCEF) IVPB 2g/100 mL premix     2 g 200 mL/hr over 30 Minutes Intravenous To [facility] 06/30/16 0921 07/01/16 0750    .  He was given sequential compression devices, early ambulation, and TED for DVT prophylaxis.  He benefited maximally from the hospital stay and there were no complications.    Recent vital signs:  Vitals:   07/02/16 0324 07/02/16 0758  BP: 124/69 111/62  Pulse: 76 74  Resp: 20 18  Temp: 97.6 F (36.4 C) 98.3 F (36.8 C)    Recent laboratory studies:  Lab Results  Component Value Date   HGB 15.3 06/23/2016   HGB 15.1  10/09/2010   HGB 17.5 (H) 10/06/2010   Lab Results  Component Value Date   WBC 9.5 06/23/2016   PLT 236 06/23/2016   Lab Results  Component Value Date   INR 0.97 10/06/2010   Lab Results  Component Value Date   NA 135 06/23/2016   K 4.3 06/23/2016   CL 101 06/23/2016   CO2 29 06/23/2016   BUN 8 06/23/2016   CREATININE 1.20 06/23/2016   GLUCOSE 151 (H) 06/23/2016    Discharge Medications:     Medication List    TAKE these medications   aspirin 81 MG tablet Take 81 mg by mouth daily.   b complex vitamins tablet Take 1 tablet by mouth daily. Super B Complex   fluticasone 50 MCG/ACT nasal spray Commonly known as:  FLONASE Place 1 spray into both nostrils daily as needed for allergies or rhinitis.   gabapentin 600 MG tablet Commonly known as:  NEURONTIN Take 0.5 tablets (300 mg total) by mouth 3 (three) times daily. What changed:  how much to take  when to take this   insulin glargine 100 UNIT/ML injection Commonly known as:  LANTUS Inject 50 Units into the skin at bedtime. Adjusts between 50-70 units at bedtime   insulin lispro 100 UNIT/ML injection Commonly known as:  HUMALOG 5-10 Units 3 (three) times daily with meals. 10 units at breakfast and lunch. 5 units at dinner   levocetirizine 5 MG tablet Commonly known as:  XYZAL Take 5 mg by mouth every evening.   lubiprostone 24 MCG capsule Commonly known as:  AMITIZA Take 24 mcg by mouth 2 (two) times daily with a meal.   Melatonin 5 MG Tabs Take 1 tablet by mouth at bedtime.   metFORMIN 1000 MG tablet Commonly known as:  GLUCOPHAGE Take 1,000 mg by mouth 2 (two) times daily with a meal.   methocarbamol 500 MG tablet Commonly known as:  ROBAXIN Take 1 tablet (500 mg total) by mouth 3 (three) times daily as needed for muscle spasms. What changed:  when to take this  reasons to take this   ondansetron 4 MG tablet Commonly known as:  ZOFRAN Take 1 tablet (4 mg total) by mouth every 8 (eight)  hours as needed for nausea or vomiting.   oxyCODONE-acetaminophen 10-325 MG tablet Commonly known as:  PERCOCET Take 1 tablet by mouth every 4 (four) hours as needed for pain. What changed:  when to take this  reasons to take this   Pancrelipase (Lip-Prot-Amyl) 24000 units Cpep Take 1 capsule by mouth 3 (three) times daily with meals.   ranitidine 150 MG tablet Commonly known as:  ZANTAC Take 150 mg by mouth daily as needed for heartburn.   sildenafil 100 MG tablet Commonly known as:  VIAGRA Take 100 mg by mouth daily as needed.   tadalafil 20 MG tablet Commonly known as:  CIALIS Take 20 mg by mouth daily as needed.   tamsulosin 0.4 MG Caps capsule Commonly known as:  FLOMAX Take 0.4 mg by mouth daily.   testosterone cypionate 200 MG/ML injection Commonly known as:  DEPOTESTOSTERONE CYPIONATE Inject 100 mg into the muscle every 7 (seven) days. Every Sunday   Vitamin D 2000 units tablet Take 2,000 Units by mouth daily.   zolpidem 5 MG tablet Commonly known as:  AMBIEN Take 5 mg by mouth at bedtime.       Diagnostic Studies: Dg Cervical Spine 2 Or 3 Views  Result Date: 07/01/2016 CLINICAL DATA:  Post C6-7 ACDF for C6-7 DDD WITH C7 RADICULOPATHY. EXAM: CERVICAL SPINE - 2-3 VIEW COMPARISON:  07/01/2016 FINDINGS: Status post anterior fusion with interbody fusion device at C6-7. Anterior osteophytes identified C2-3 and C5-6. IMPRESSION: ACDF at C6-7. Electronically Signed   By: Nolon Nations M.D.   On: 07/01/2016 11:14   Dg Cervical Spine 2-3 Views  Result Date: 07/01/2016 CLINICAL DATA:  Cervical fusion. EXAM: DG C-ARM 61-120 MIN; CERVICAL SPINE - 2-3 VIEW COMPARISON:  None. FINDINGS: Fluoroscopic spot films from the operating around demonstrated initial localization of C6-7 and subsequent anterior and interbody fusion hardware placement. Good position and alignment without complicating features. IMPRESSION: C6-7 anterior and interbody fusion. Electronically Signed   By:  Marijo Sanes M.D.   On: 07/01/2016 10:46   Dg C-arm 1-60 Min  Result Date: 07/01/2016 CLINICAL DATA:  Cervical fusion. EXAM: DG C-ARM 61-120 MIN; CERVICAL SPINE - 2-3 VIEW COMPARISON:  None. FINDINGS: Fluoroscopic spot films from the operating around demonstrated initial localization of C6-7 and subsequent anterior and interbody fusion hardware placement. Good position and alignment without complicating features. IMPRESSION: C6-7 anterior and interbody fusion. Electronically Signed   By: Marijo Sanes M.D.   On: 07/01/2016 10:46    Disposition: 01-Home or Self Care    Follow-up Information    Dahlia Bailiff,  MD. Schedule an appointment as soon as possible for a visit in 2 weeks.   Specialty:  Orthopedic Surgery Why:  If symptoms worsen, For suture removal, For wound re-check Contact information: [address] [place] [address] [id number]            Signed: Valinda Hoar 07/06/2016, 1:18 PM

## 2016-07-12 DIAGNOSIS — Z9889 Other specified postprocedural states: Secondary | ICD-10-CM | POA: Diagnosis not present

## 2016-07-12 NOTE — Anesthesia Postprocedure Evaluation (Signed)
Anesthesia Post Note  Patient: Micheal Parker  Procedure(s) Performed: Procedure(s) (LRB): ACDF C6-7 (N/A)  Patient location during evaluation: PACU Anesthesia Type: General Level of consciousness: awake and alert Pain management: pain level controlled Respiratory status: spontaneous breathing Cardiovascular status: stable Anesthetic complications: no    Last Vitals:  Vitals:   07/02/16 0324 07/02/16 0758  BP: 124/69 111/62  Pulse: 76 74  Resp: 20 18  Temp: 36.4 C 36.8 C    Last Pain:  Vitals:   07/02/16 0713  TempSrc:   PainSc: 6                  EDWARDS,Sequoya Hogsett

## 2016-08-05 DIAGNOSIS — M50123 Cervical disc disorder at C6-C7 level with radiculopathy: Secondary | ICD-10-CM | POA: Diagnosis not present

## 2016-08-05 DIAGNOSIS — Z79891 Long term (current) use of opiate analgesic: Secondary | ICD-10-CM | POA: Diagnosis not present

## 2016-08-05 DIAGNOSIS — M961 Postlaminectomy syndrome, not elsewhere classified: Secondary | ICD-10-CM | POA: Diagnosis not present

## 2016-08-09 DIAGNOSIS — Z4789 Encounter for other orthopedic aftercare: Secondary | ICD-10-CM | POA: Diagnosis not present

## 2016-08-10 DIAGNOSIS — N4289 Other specified disorders of prostate: Secondary | ICD-10-CM | POA: Diagnosis not present

## 2016-08-10 DIAGNOSIS — Z125 Encounter for screening for malignant neoplasm of prostate: Secondary | ICD-10-CM | POA: Diagnosis not present

## 2016-08-10 DIAGNOSIS — K861 Other chronic pancreatitis: Secondary | ICD-10-CM | POA: Diagnosis not present

## 2016-08-10 DIAGNOSIS — E1165 Type 2 diabetes mellitus with hyperglycemia: Secondary | ICD-10-CM | POA: Diagnosis not present

## 2016-08-10 DIAGNOSIS — I10 Essential (primary) hypertension: Secondary | ICD-10-CM | POA: Diagnosis not present

## 2016-08-22 DIAGNOSIS — Z23 Encounter for immunization: Secondary | ICD-10-CM | POA: Diagnosis not present

## 2016-08-23 DIAGNOSIS — M542 Cervicalgia: Secondary | ICD-10-CM | POA: Diagnosis not present

## 2016-08-23 DIAGNOSIS — M62838 Other muscle spasm: Secondary | ICD-10-CM | POA: Diagnosis not present

## 2016-08-23 DIAGNOSIS — M2560 Stiffness of unspecified joint, not elsewhere classified: Secondary | ICD-10-CM | POA: Diagnosis not present

## 2016-08-23 DIAGNOSIS — Z4789 Encounter for other orthopedic aftercare: Secondary | ICD-10-CM | POA: Diagnosis not present

## 2016-08-24 DIAGNOSIS — M542 Cervicalgia: Secondary | ICD-10-CM | POA: Diagnosis not present

## 2016-08-24 DIAGNOSIS — M62838 Other muscle spasm: Secondary | ICD-10-CM | POA: Diagnosis not present

## 2016-08-24 DIAGNOSIS — Z4789 Encounter for other orthopedic aftercare: Secondary | ICD-10-CM | POA: Diagnosis not present

## 2016-08-24 DIAGNOSIS — M2560 Stiffness of unspecified joint, not elsewhere classified: Secondary | ICD-10-CM | POA: Diagnosis not present

## 2016-08-27 DIAGNOSIS — M542 Cervicalgia: Secondary | ICD-10-CM | POA: Diagnosis not present

## 2016-08-27 DIAGNOSIS — M2560 Stiffness of unspecified joint, not elsewhere classified: Secondary | ICD-10-CM | POA: Diagnosis not present

## 2016-08-27 DIAGNOSIS — M62838 Other muscle spasm: Secondary | ICD-10-CM | POA: Diagnosis not present

## 2016-08-27 DIAGNOSIS — Z4789 Encounter for other orthopedic aftercare: Secondary | ICD-10-CM | POA: Diagnosis not present

## 2016-08-30 DIAGNOSIS — M2560 Stiffness of unspecified joint, not elsewhere classified: Secondary | ICD-10-CM | POA: Diagnosis not present

## 2016-08-30 DIAGNOSIS — Z4789 Encounter for other orthopedic aftercare: Secondary | ICD-10-CM | POA: Diagnosis not present

## 2016-08-30 DIAGNOSIS — M542 Cervicalgia: Secondary | ICD-10-CM | POA: Diagnosis not present

## 2016-08-30 DIAGNOSIS — M62838 Other muscle spasm: Secondary | ICD-10-CM | POA: Diagnosis not present

## 2016-08-31 DIAGNOSIS — R0602 Shortness of breath: Secondary | ICD-10-CM | POA: Diagnosis not present

## 2016-08-31 DIAGNOSIS — R05 Cough: Secondary | ICD-10-CM | POA: Diagnosis not present

## 2016-09-01 DIAGNOSIS — M961 Postlaminectomy syndrome, not elsewhere classified: Secondary | ICD-10-CM | POA: Diagnosis not present

## 2016-09-08 DIAGNOSIS — M65342 Trigger finger, left ring finger: Secondary | ICD-10-CM | POA: Diagnosis not present

## 2016-09-08 DIAGNOSIS — M79642 Pain in left hand: Secondary | ICD-10-CM | POA: Diagnosis not present

## 2016-09-08 DIAGNOSIS — M79641 Pain in right hand: Secondary | ICD-10-CM | POA: Diagnosis not present

## 2016-09-08 DIAGNOSIS — M65321 Trigger finger, right index finger: Secondary | ICD-10-CM | POA: Diagnosis not present

## 2016-09-09 DIAGNOSIS — M62838 Other muscle spasm: Secondary | ICD-10-CM | POA: Diagnosis not present

## 2016-09-09 DIAGNOSIS — Z4789 Encounter for other orthopedic aftercare: Secondary | ICD-10-CM | POA: Diagnosis not present

## 2016-09-09 DIAGNOSIS — M542 Cervicalgia: Secondary | ICD-10-CM | POA: Diagnosis not present

## 2016-09-09 DIAGNOSIS — M2560 Stiffness of unspecified joint, not elsewhere classified: Secondary | ICD-10-CM | POA: Diagnosis not present

## 2016-09-10 DIAGNOSIS — Z4789 Encounter for other orthopedic aftercare: Secondary | ICD-10-CM | POA: Diagnosis not present

## 2016-09-10 DIAGNOSIS — M2560 Stiffness of unspecified joint, not elsewhere classified: Secondary | ICD-10-CM | POA: Diagnosis not present

## 2016-09-10 DIAGNOSIS — M542 Cervicalgia: Secondary | ICD-10-CM | POA: Diagnosis not present

## 2016-09-10 DIAGNOSIS — M62838 Other muscle spasm: Secondary | ICD-10-CM | POA: Diagnosis not present

## 2016-09-14 DIAGNOSIS — M542 Cervicalgia: Secondary | ICD-10-CM | POA: Diagnosis not present

## 2016-09-14 DIAGNOSIS — M62838 Other muscle spasm: Secondary | ICD-10-CM | POA: Diagnosis not present

## 2016-09-14 DIAGNOSIS — Z4789 Encounter for other orthopedic aftercare: Secondary | ICD-10-CM | POA: Diagnosis not present

## 2016-09-14 DIAGNOSIS — M2560 Stiffness of unspecified joint, not elsewhere classified: Secondary | ICD-10-CM | POA: Diagnosis not present

## 2016-09-14 DIAGNOSIS — M545 Low back pain: Secondary | ICD-10-CM | POA: Diagnosis not present

## 2016-09-16 DIAGNOSIS — M2560 Stiffness of unspecified joint, not elsewhere classified: Secondary | ICD-10-CM | POA: Diagnosis not present

## 2016-09-16 DIAGNOSIS — M545 Low back pain: Secondary | ICD-10-CM | POA: Diagnosis not present

## 2016-09-16 DIAGNOSIS — M542 Cervicalgia: Secondary | ICD-10-CM | POA: Diagnosis not present

## 2016-09-16 DIAGNOSIS — M62838 Other muscle spasm: Secondary | ICD-10-CM | POA: Diagnosis not present

## 2016-09-16 DIAGNOSIS — Z4789 Encounter for other orthopedic aftercare: Secondary | ICD-10-CM | POA: Diagnosis not present

## 2016-09-20 DIAGNOSIS — M50123 Cervical disc disorder at C6-C7 level with radiculopathy: Secondary | ICD-10-CM | POA: Diagnosis not present

## 2016-09-20 DIAGNOSIS — Z4789 Encounter for other orthopedic aftercare: Secondary | ICD-10-CM | POA: Diagnosis not present

## 2016-10-07 IMAGING — CR DG CERVICAL SPINE 2 OR 3 VIEWS
2 series · 2 of 2 positions shown · non-contrast
Comparison: 07/01/2016

CLINICAL DATA: Post C6-7 ACDF for C6-7 DDD WITH C7 RADICULOPATHY.

EXAM:
CERVICAL SPINE - 2-3 VIEW

[AP (1 of 2)]
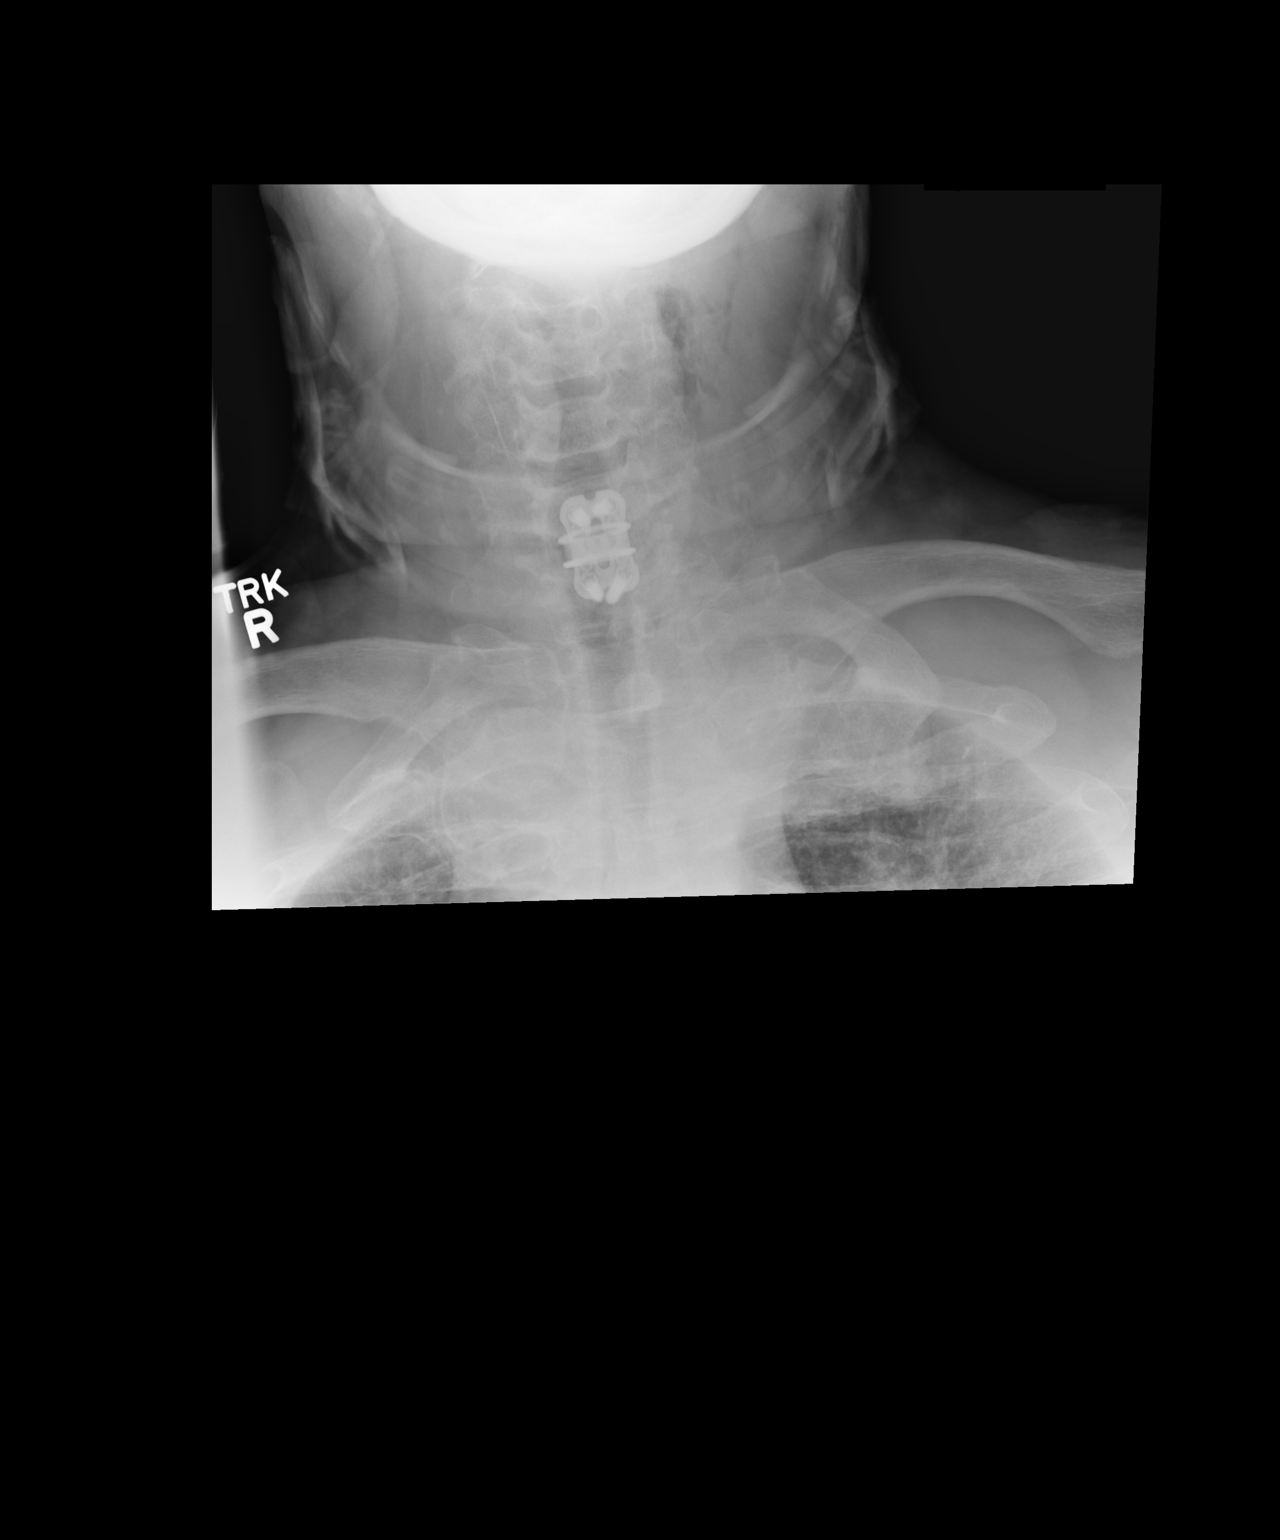

[AP (2 of 2)]
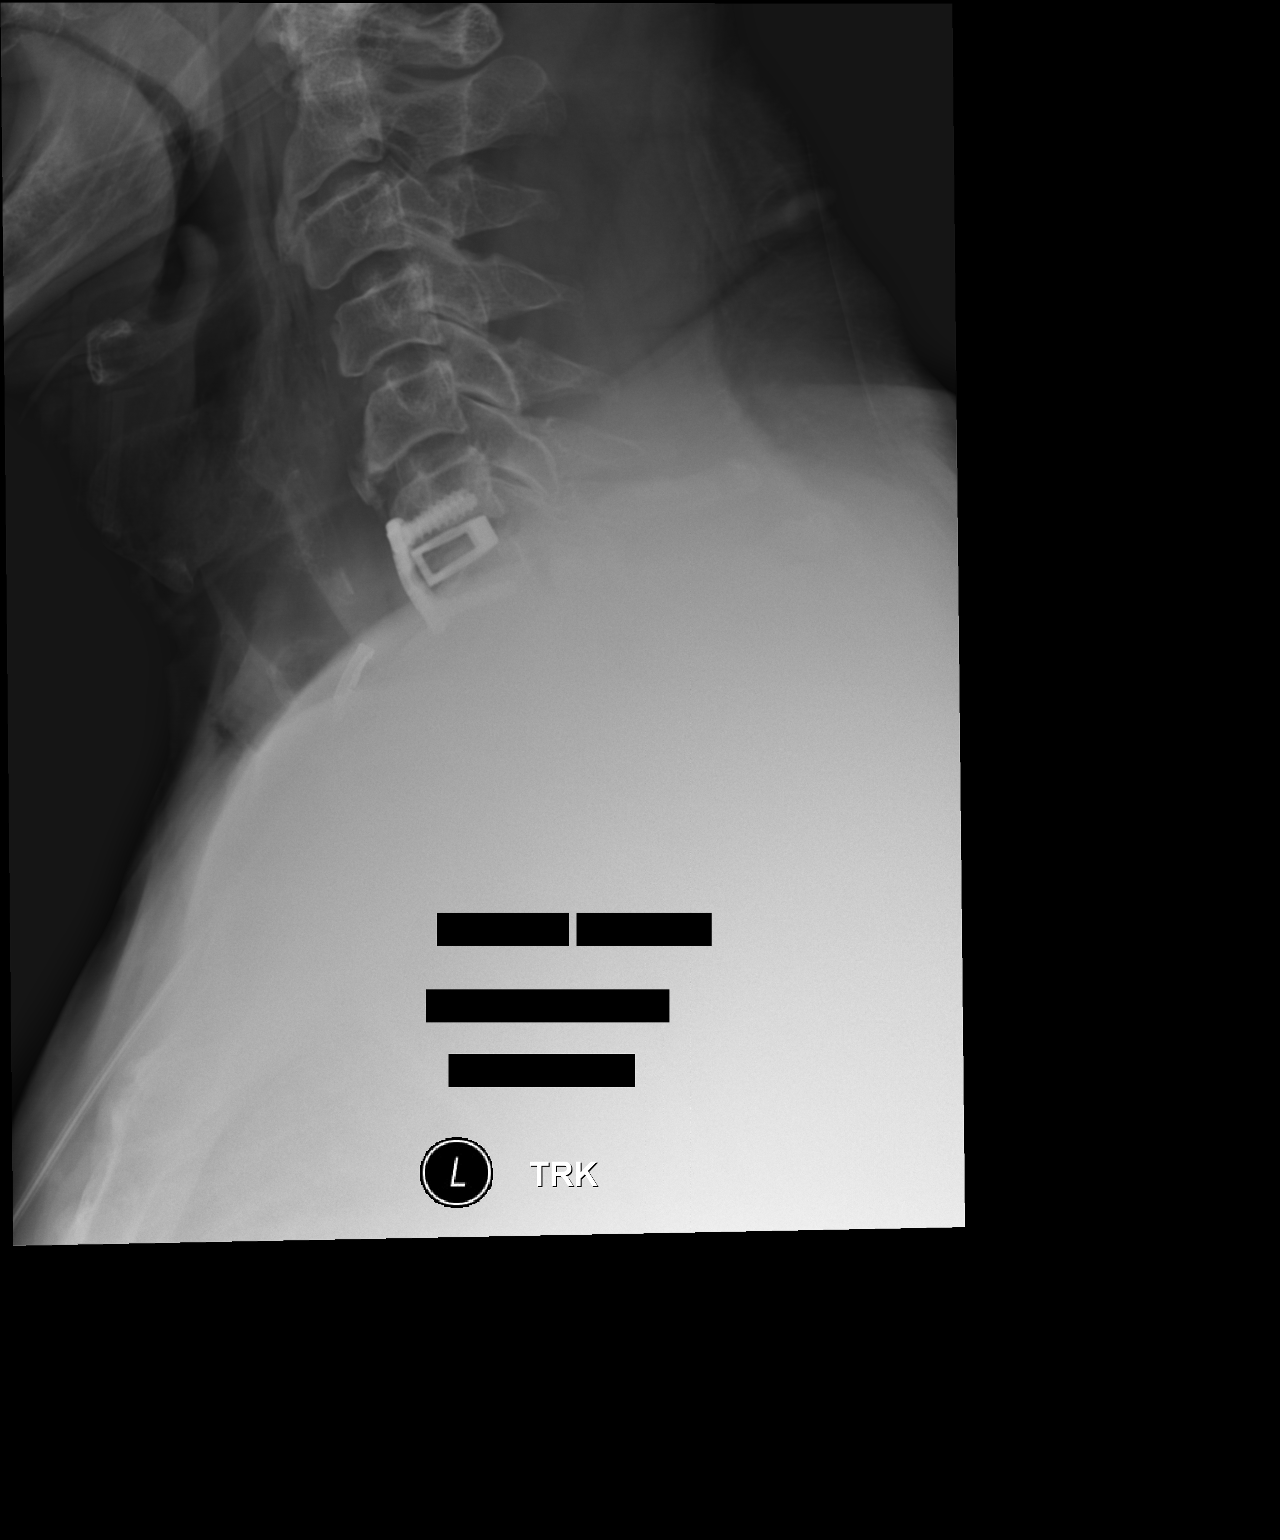

[2 of 2 positions shown; findings below may reference images not displayed]

FINDINGS: Status post anterior fusion with interbody fusion device at C6-7.
Anterior osteophytes identified C2-3 and C5-6.
IMPRESSION: ACDF at C6-7.

## 2016-10-07 IMAGING — RF DG C-ARM 61-120 MIN
1 series · 3 of 3 positions shown · non-contrast
Comparison: None.

CLINICAL DATA: Cervical fusion.

EXAM:
DG C-ARM 61-120 MIN; CERVICAL SPINE - 2-3 VIEW

[Series 1: run · 3 of 3 slices shown]
[im 1/3]
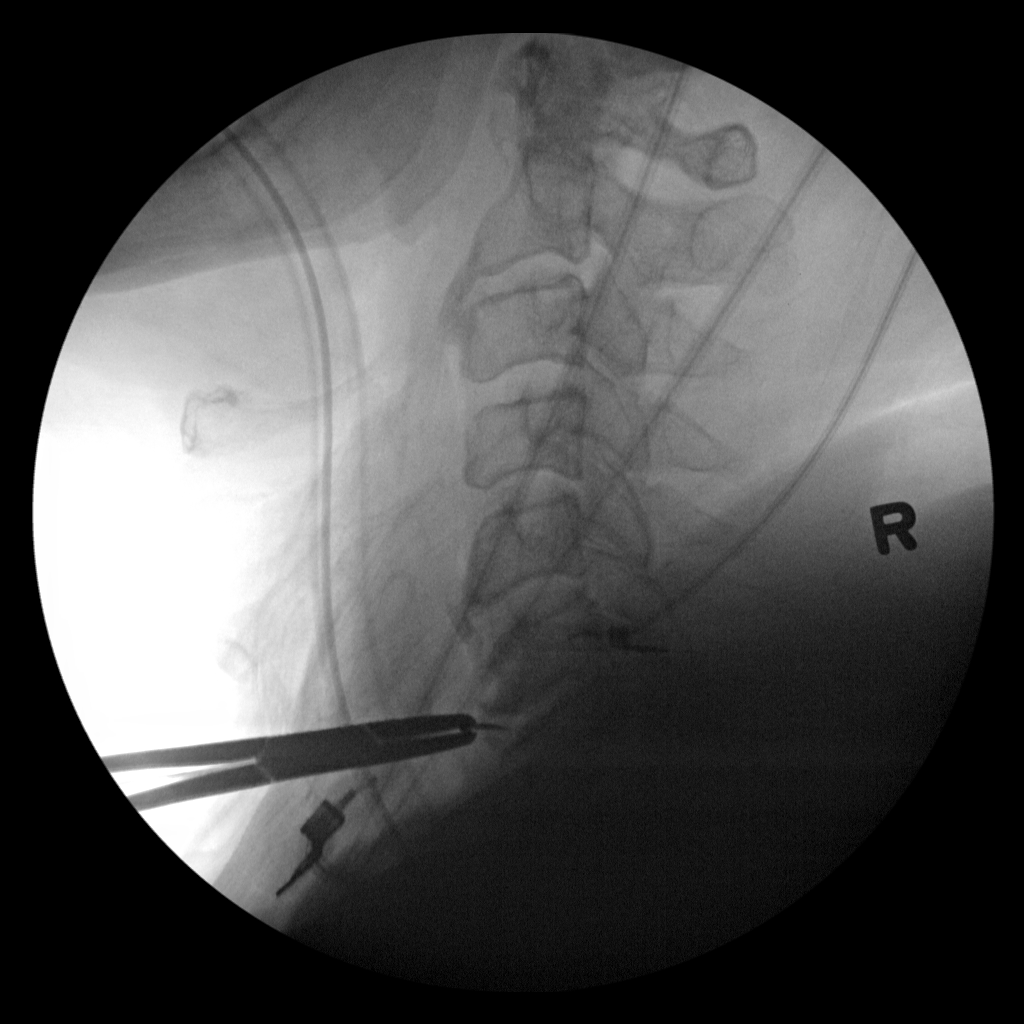
[im 2/3]
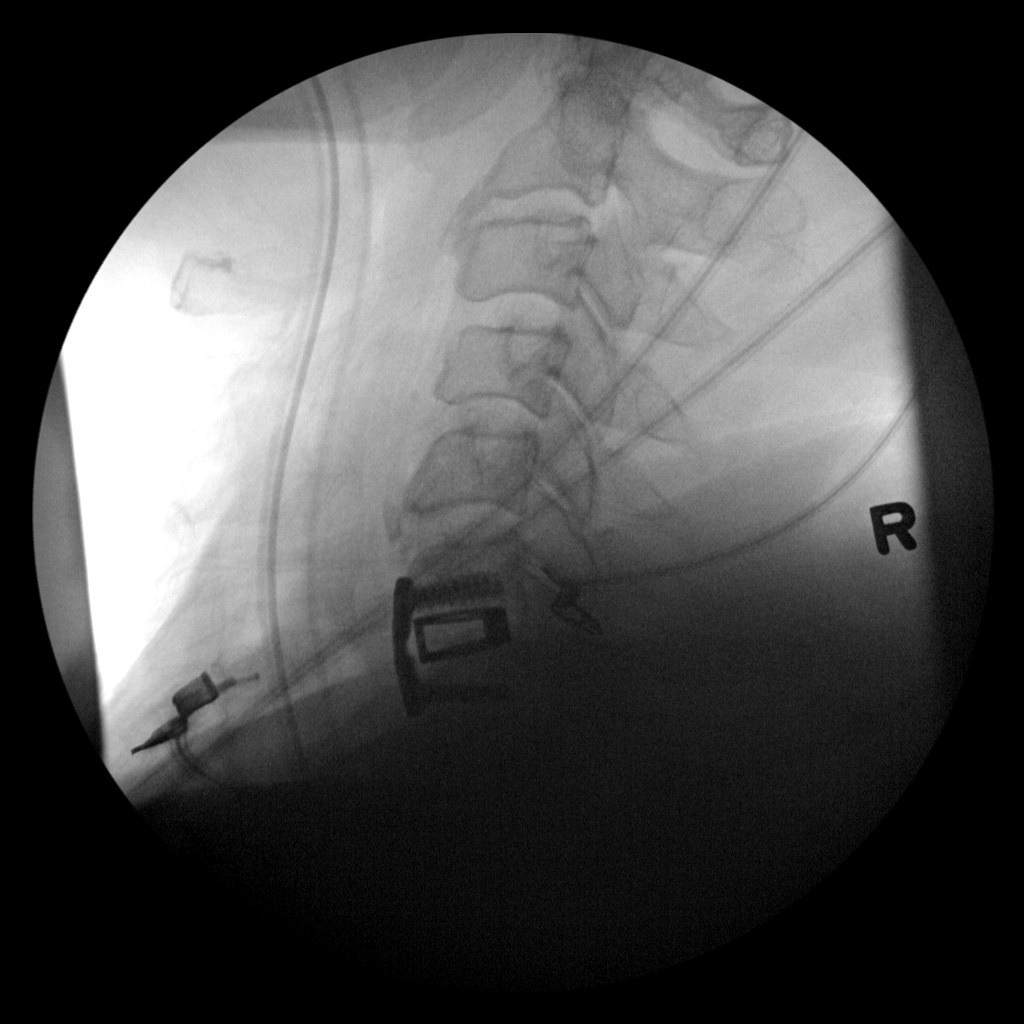
[im 3/3]
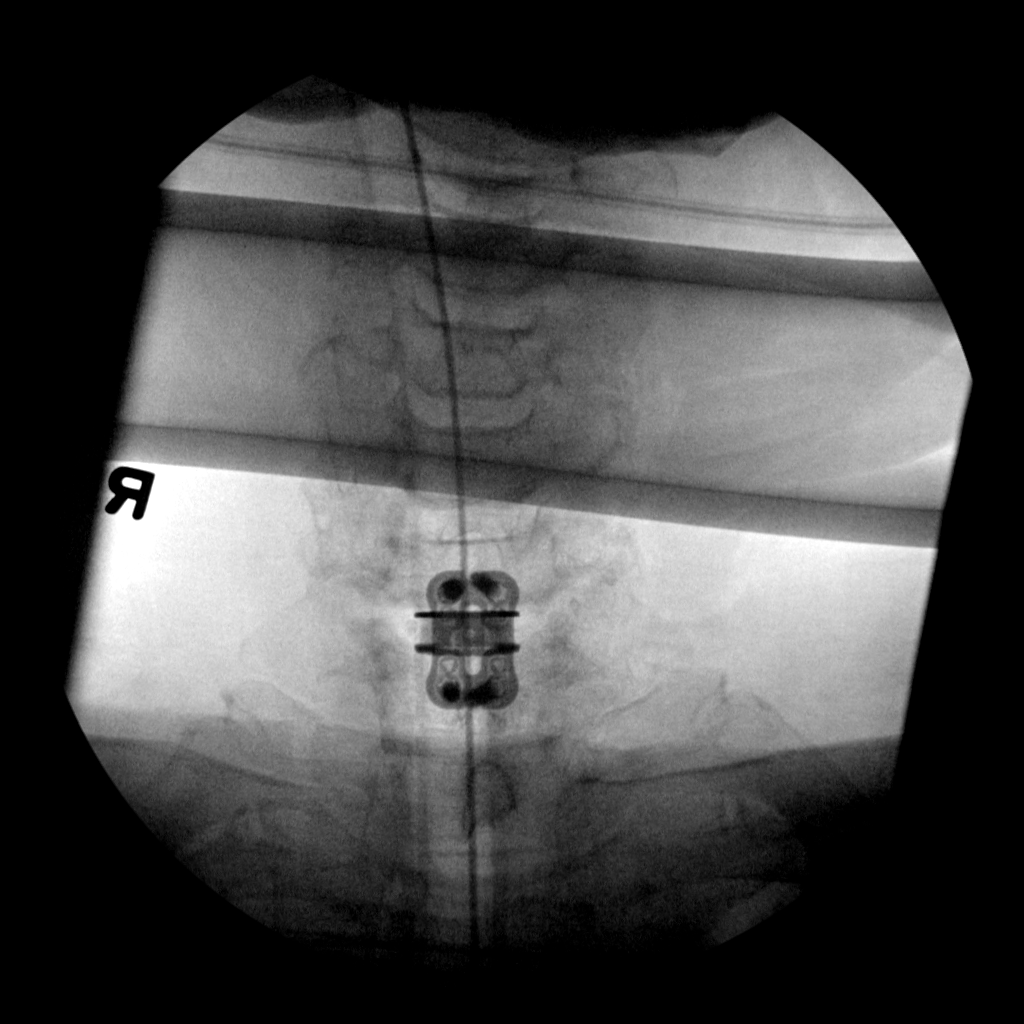

[3 of 3 positions shown; findings below may reference images not displayed]

FINDINGS: Fluoroscopic spot films from the operating around demonstrated
initial localization of C6-7 and subsequent anterior and interbody
fusion hardware placement. Good position and alignment without
complicating features.
IMPRESSION: C6-7 anterior and interbody fusion.

## 2016-11-02 DIAGNOSIS — E119 Type 2 diabetes mellitus without complications: Secondary | ICD-10-CM | POA: Diagnosis not present

## 2016-11-02 DIAGNOSIS — H5203 Hypermetropia, bilateral: Secondary | ICD-10-CM | POA: Diagnosis not present

## 2016-11-02 DIAGNOSIS — H40013 Open angle with borderline findings, low risk, bilateral: Secondary | ICD-10-CM | POA: Diagnosis not present

## 2016-11-02 DIAGNOSIS — H52203 Unspecified astigmatism, bilateral: Secondary | ICD-10-CM | POA: Diagnosis not present

## 2016-11-02 DIAGNOSIS — H524 Presbyopia: Secondary | ICD-10-CM | POA: Diagnosis not present

## 2016-11-02 DIAGNOSIS — H2511 Age-related nuclear cataract, right eye: Secondary | ICD-10-CM | POA: Diagnosis not present

## 2016-11-02 DIAGNOSIS — Z794 Long term (current) use of insulin: Secondary | ICD-10-CM | POA: Diagnosis not present

## 2016-11-02 DIAGNOSIS — H2513 Age-related nuclear cataract, bilateral: Secondary | ICD-10-CM | POA: Diagnosis not present

## 2016-11-04 DIAGNOSIS — Z79899 Other long term (current) drug therapy: Secondary | ICD-10-CM | POA: Diagnosis not present

## 2016-11-04 DIAGNOSIS — K5010 Crohn's disease of large intestine without complications: Secondary | ICD-10-CM | POA: Diagnosis not present

## 2016-11-04 DIAGNOSIS — K861 Other chronic pancreatitis: Secondary | ICD-10-CM | POA: Diagnosis not present

## 2016-11-04 DIAGNOSIS — K5080 Crohn's disease of both small and large intestine without complications: Secondary | ICD-10-CM | POA: Diagnosis not present

## 2016-11-04 DIAGNOSIS — E739 Lactose intolerance, unspecified: Secondary | ICD-10-CM | POA: Diagnosis not present

## 2016-11-04 DIAGNOSIS — R197 Diarrhea, unspecified: Secondary | ICD-10-CM | POA: Diagnosis not present

## 2016-11-04 DIAGNOSIS — R110 Nausea: Secondary | ICD-10-CM | POA: Diagnosis not present

## 2016-11-09 DIAGNOSIS — N4289 Other specified disorders of prostate: Secondary | ICD-10-CM | POA: Diagnosis not present

## 2016-11-09 DIAGNOSIS — Z0000 Encounter for general adult medical examination without abnormal findings: Secondary | ICD-10-CM | POA: Diagnosis not present

## 2016-11-09 DIAGNOSIS — Z1389 Encounter for screening for other disorder: Secondary | ICD-10-CM | POA: Diagnosis not present

## 2016-11-09 DIAGNOSIS — E1165 Type 2 diabetes mellitus with hyperglycemia: Secondary | ICD-10-CM | POA: Diagnosis not present

## 2016-11-09 DIAGNOSIS — K861 Other chronic pancreatitis: Secondary | ICD-10-CM | POA: Diagnosis not present

## 2016-11-09 DIAGNOSIS — G3184 Mild cognitive impairment, so stated: Secondary | ICD-10-CM | POA: Diagnosis not present

## 2016-11-09 DIAGNOSIS — I10 Essential (primary) hypertension: Secondary | ICD-10-CM | POA: Diagnosis not present

## 2016-11-10 DIAGNOSIS — M65342 Trigger finger, left ring finger: Secondary | ICD-10-CM | POA: Diagnosis not present

## 2016-11-10 DIAGNOSIS — M65321 Trigger finger, right index finger: Secondary | ICD-10-CM | POA: Diagnosis not present

## 2016-11-10 DIAGNOSIS — M65311 Trigger thumb, right thumb: Secondary | ICD-10-CM | POA: Diagnosis not present

## 2016-11-10 DIAGNOSIS — M65341 Trigger finger, right ring finger: Secondary | ICD-10-CM | POA: Diagnosis not present

## 2016-11-27 DIAGNOSIS — E119 Type 2 diabetes mellitus without complications: Secondary | ICD-10-CM | POA: Diagnosis not present

## 2016-11-27 DIAGNOSIS — R05 Cough: Secondary | ICD-10-CM | POA: Diagnosis not present

## 2016-11-27 DIAGNOSIS — R0602 Shortness of breath: Secondary | ICD-10-CM | POA: Diagnosis not present

## 2016-11-27 DIAGNOSIS — J180 Bronchopneumonia, unspecified organism: Secondary | ICD-10-CM | POA: Diagnosis not present

## 2016-11-27 DIAGNOSIS — R0781 Pleurodynia: Secondary | ICD-10-CM | POA: Diagnosis not present

## 2016-11-30 NOTE — Patient Instructions (Signed)
Micheal Parker  11/30/2016     @PREFPERIOPPHARMACY @   Your procedure is scheduled on 12/07/2016.  Report to [facility] at 7:30 A.M.  Call this number if you have problems the morning of surgery:  [phone]   Remember:  Do not eat food or drink liquids after midnight.  Take these medicines the morning of surgery with A SIP OF WATER Flomax, Zantac, Percocet, Zofran, Robaxin, Xyzal, Flonase  DO NOT TAKE YOUR DIABETIC MEDICATIONS DAY OF PROCEDURE  TAKE ONLY 1/2 OF YOUR EVENING DOSE OF INSULIN NIGHT PRIOR TO PROCEDURE   Do not wear jewelry, make-up or nail polish.  Do not wear lotions, powders, or perfumes, or deoderant.  Do not shave 48 hours prior to surgery.  Men may shave face and neck.  Do not bring valuables to the hospital.  [facility] is not responsible for any belongings or valuables.  Contacts, dentures or bridgework may not be worn into surgery.  Leave your suitcase in the car.  After surgery it may be brought to your room.  For patients admitted to the hospital, discharge time will be determined by your treatment team.  Patients discharged the day of surgery will not be allowed to drive home.    Please read over the following fact sheets that you were given. Anesthesia Post-op Instructions     PATIENT INSTRUCTIONS POST-ANESTHESIA  IMMEDIATELY FOLLOWING SURGERY:  Do not drive or operate machinery for the first twenty four hours after surgery.  Do not make any important decisions for twenty four hours after surgery or while taking narcotic pain medications or sedatives.  If you develop intractable nausea and vomiting or a severe headache please notify your doctor immediately.  FOLLOW-UP:  Please make an appointment with your surgeon as instructed. You do not need to follow up with anesthesia unless specifically instructed to do so.  WOUND CARE INSTRUCTIONS (if applicable):  Keep a dry clean dressing on the anesthesia/puncture wound site if there is drainage.  Once  the wound has quit draining you may leave it open to air.  Generally you should leave the bandage intact for twenty four hours unless there is drainage.  If the epidural site drains for more than 36-48 hours please call the anesthesia department.  QUESTIONS?:  Please feel free to call your physician or the hospital operator if you have any questions, and they will be happy to assist you.      Cataract Surgery Cataract surgery is a procedure to remove a cataract from your eye. A cataract is cloudiness on the lens of your eye. The lens focuses light inside the eye. When a lens becomes cloudy, your vision is affected. Cataract surgery is a procedure to remove the cloudy lens. A substitute lens (intraocular lens or IOL) is usually inserted as a replacement for the cloudy lens. Tell a health care provider about:  Any allergies you have.  All medicines you are taking, including vitamins, herbs, eye drops, creams, and over-the-counter medicines.  Any problems you or family members have had with anesthetic medicines.  Any blood disorders you have.  Any surgeries you have had, especially eye surgeries that include refractive surgery, such as PRK and LASIK.  Any medical conditions you have.  Whether you are pregnant or may be pregnant. What are the risks? Generally, this is a safe procedure. However, problems may occur, including:  Infection.  Bleeding.  Glaucoma.  Retinal detachment.  Allergic reactions to medicines.  Damage to other structures or organs.  Inflammation of the eye.  Clouding of the part of your eye that holds an IOL in place (after-cataract), if an IOL was inserted. This is fairly common.  An IOL moving out of position, if an IOL was inserted. This is very rare.  Loss of vision. This is rare. What happens before the procedure?  Follow instructions from your health care provider about eating or drinking restrictions.  Ask your health care provider  about:  Changing or stopping your regular medicines, including any eye drops you have been prescribed. This is especially important if you are taking diabetes medicines or blood thinners.  Taking medicines such as aspirin and ibuprofen. These medicines can thin your blood. Do not take these medicines before your procedure if your health care provider instructs you not to.  Do not put contact lenses in either eye on the day of your surgery.  Plan for someone to drive you to and from the procedure.  If you will be going home right after the procedure, plan to have someone with you for 24 hours. What happens during the procedure?  An IV tube may be inserted into one of your veins.  You will be given one or more of the following:  A medicine to help you relax (sedative).  A medicine to numb the area (local anesthetic). This may be numbing eye drops or an injection that is given behind the eye.  A small cut (incision) will be made to the edge of the clear, dome-shaped surface that covers the front of the eye (cornea).  A small probe will be inserted into the eye. This device gives off ultrasound waves that soften and break up the cloudy center of the lens. This makes it easier for the cloudy lens to be removed by suction.  An IOL may be implanted.  Part of the capsule that surrounds the lens will be left in the eye to support the IOL.  Your surgeon may use stitches (sutures) to close the incision. The procedure may vary among health care providers and hospitals. What happens after the procedure?  Your blood pressure, heart rate, breathing rate, and blood oxygen level will be monitored often until the medicines you were given have worn off.  You may be given a protective shield to wear over your eyes.  Do not drive for 24 hours if you received a sedative. This information is not intended to replace advice given to you by your health care provider. Make sure you discuss any questions  you have with your health care provider. Document Released: 11/04/2011 Document Revised: 04/22/2016 Document Reviewed: 09/25/2015 Elsevier Interactive Patient Education  2017 [employer].

## 2016-12-02 ENCOUNTER — Encounter (HOSPITAL_COMMUNITY): Payer: Self-pay

## 2016-12-02 ENCOUNTER — Encounter (HOSPITAL_COMMUNITY)
Admission: RE | Admit: 2016-12-02 | Discharge: 2016-12-02 | Disposition: A | Discharge status: Home / Self Care | Payer: Medicare Other | Source: Ambulatory Visit | Attending: Ophthalmology | Admitting: Ophthalmology

## 2016-12-02 DIAGNOSIS — H2511 Age-related nuclear cataract, right eye: Secondary | ICD-10-CM | POA: Insufficient documentation

## 2016-12-02 DIAGNOSIS — Z01812 Encounter for preprocedural laboratory examination: Secondary | ICD-10-CM | POA: Insufficient documentation

## 2016-12-02 LAB — CBC WITH DIFFERENTIAL/PLATELET
BASOS PCT: 1 %
Basophils Absolute: 0.1 10*3/uL (ref 0.0–0.1)
Eosinophils Absolute: 0.6 10*3/uL (ref 0.0–0.7)
Eosinophils Relative: 5 %
HEMATOCRIT: 54 % — AB (ref 39.0–52.0)
HEMOGLOBIN: 17.9 g/dL — AB (ref 13.0–17.0)
LYMPHS PCT: 32 %
Lymphs Abs: 3.9 10*3/uL (ref 0.7–4.0)
MCH: 29.4 pg (ref 26.0–34.0)
MCHC: 33.1 g/dL (ref 30.0–36.0)
MCV: 88.7 fL (ref 78.0–100.0)
Monocytes Absolute: 0.9 10*3/uL (ref 0.1–1.0)
Monocytes Relative: 8 %
NEUTROS ABS: 6.7 10*3/uL (ref 1.7–7.7)
NEUTROS PCT: 54 %
Platelets: 287 10*3/uL (ref 150–400)
RBC: 6.09 MIL/uL — ABNORMAL HIGH (ref 4.22–5.81)
RDW: 14 % (ref 11.5–15.5)
WBC: 12.2 10*3/uL — ABNORMAL HIGH (ref 4.0–10.5)

## 2016-12-02 LAB — BASIC METABOLIC PANEL
ANION GAP: 9 (ref 5–15)
BUN: 13 mg/dL (ref 6–20)
CHLORIDE: 100 mmol/L — AB (ref 101–111)
CO2: 27 mmol/L (ref 22–32)
CREATININE: 1.26 mg/dL — AB (ref 0.61–1.24)
Calcium: 9.5 mg/dL (ref 8.9–10.3)
GFR calc non Af Amer: 57 mL/min — ABNORMAL LOW (ref >60)
GLUCOSE: 66 mg/dL (ref 65–99)
Potassium: 3.9 mmol/L (ref 3.5–5.1)
Sodium: 136 mmol/L (ref 135–145)

## 2016-12-03 DIAGNOSIS — M961 Postlaminectomy syndrome, not elsewhere classified: Secondary | ICD-10-CM | POA: Diagnosis not present

## 2016-12-03 DIAGNOSIS — M50323 Other cervical disc degeneration at C6-C7 level: Secondary | ICD-10-CM | POA: Diagnosis not present

## 2016-12-07 ENCOUNTER — Ambulatory Visit (HOSPITAL_COMMUNITY): Payer: Medicare Other | Admitting: Anesthesiology

## 2016-12-07 ENCOUNTER — Encounter (HOSPITAL_COMMUNITY)
Admission: RE | Disposition: A | Discharge status: Home / Self Care | Payer: Self-pay | Source: Ambulatory Visit | Attending: Ophthalmology

## 2016-12-07 ENCOUNTER — Ambulatory Visit (HOSPITAL_COMMUNITY)
Admission: RE | Admit: 2016-12-07 | Discharge: 2016-12-07 | Disposition: A | Discharge status: Home / Self Care | Payer: Medicare Other | Source: Ambulatory Visit | Attending: Ophthalmology | Admitting: Ophthalmology

## 2016-12-07 ENCOUNTER — Encounter (HOSPITAL_COMMUNITY): Payer: Self-pay | Admitting: *Deleted

## 2016-12-07 DIAGNOSIS — E1136 Type 2 diabetes mellitus with diabetic cataract: Secondary | ICD-10-CM | POA: Insufficient documentation

## 2016-12-07 DIAGNOSIS — G4730 Sleep apnea, unspecified: Secondary | ICD-10-CM | POA: Insufficient documentation

## 2016-12-07 DIAGNOSIS — Z87891 Personal history of nicotine dependence: Secondary | ICD-10-CM | POA: Insufficient documentation

## 2016-12-07 DIAGNOSIS — H2512 Age-related nuclear cataract, left eye: Secondary | ICD-10-CM | POA: Diagnosis not present

## 2016-12-07 DIAGNOSIS — Z7984 Long term (current) use of oral hypoglycemic drugs: Secondary | ICD-10-CM | POA: Diagnosis not present

## 2016-12-07 DIAGNOSIS — H2511 Age-related nuclear cataract, right eye: Secondary | ICD-10-CM | POA: Diagnosis not present

## 2016-12-07 HISTORY — PX: CATARACT EXTRACTION W/PHACO: SHX586

## 2016-12-07 LAB — GLUCOSE, CAPILLARY: Glucose-Capillary: 118 mg/dL — ABNORMAL HIGH (ref 65–99)

## 2016-12-07 SURGERY — PHACOEMULSIFICATION, CATARACT, WITH IOL INSERTION
Anesthesia: Monitor Anesthesia Care | Site: Eye | Laterality: Right

## 2016-12-07 MED ORDER — BSS IO SOLN
INTRAOCULAR | Status: DC | PRN
  Administered 2016-12-07: 15 mL

## 2016-12-07 MED ORDER — EPINEPHRINE PF 1 MG/ML IJ SOLN
INTRAMUSCULAR | Status: AC
  Filled 2016-12-07: qty 1

## 2016-12-07 MED ORDER — PROVISC 10 MG/ML IO SOLN
INTRAOCULAR | Status: DC | PRN
  Administered 2016-12-07: 0.85 mL via INTRAOCULAR

## 2016-12-07 MED ORDER — TETRACAINE 0.5 % OP SOLN OPTIME - NO CHARGE
OPHTHALMIC | Status: DC | PRN
  Administered 2016-12-07: 2 [drp] via OPHTHALMIC

## 2016-12-07 MED ORDER — LACTATED RINGERS IV SOLN
INTRAVENOUS | Status: DC
  Administered 2016-12-07: 09:00:00 via INTRAVENOUS

## 2016-12-07 MED ORDER — MIDAZOLAM HCL 2 MG/2ML IJ SOLN
1.0000 mg | INTRAMUSCULAR | Status: DC | PRN
  Administered 2016-12-07: 2 mg via INTRAVENOUS

## 2016-12-07 MED ORDER — EPINEPHRINE PF 1 MG/ML IJ SOLN
INTRAOCULAR | Status: DC | PRN
  Administered 2016-12-07: 500 mL

## 2016-12-07 MED ORDER — TETRACAINE HCL 0.5 % OP SOLN
1.0000 [drp] | OPHTHALMIC | Status: AC
  Administered 2016-12-07 (×3): 1 [drp] via OPHTHALMIC

## 2016-12-07 MED ORDER — MIDAZOLAM HCL 2 MG/2ML IJ SOLN
INTRAMUSCULAR | Status: AC
  Filled 2016-12-07: qty 2

## 2016-12-07 MED ORDER — LACTATED RINGERS IV SOLN
INTRAVENOUS | Status: DC | PRN
  Administered 2016-12-07: 08:00:00 via INTRAVENOUS

## 2016-12-07 MED ORDER — FENTANYL CITRATE (PF) 100 MCG/2ML IJ SOLN
25.0000 ug | INTRAMUSCULAR | Status: AC | PRN
  Administered 2016-12-07 (×2): 25 ug via INTRAVENOUS

## 2016-12-07 MED ORDER — CYCLOPENTOLATE-PHENYLEPHRINE 0.2-1 % OP SOLN
1.0000 [drp] | OPHTHALMIC | Status: AC
  Administered 2016-12-07 (×3): 1 [drp] via OPHTHALMIC

## 2016-12-07 MED ORDER — FENTANYL CITRATE (PF) 100 MCG/2ML IJ SOLN
INTRAMUSCULAR | Status: AC
  Filled 2016-12-07: qty 2

## 2016-12-07 MED ORDER — PHENYLEPHRINE HCL 2.5 % OP SOLN
1.0000 [drp] | OPHTHALMIC | Status: AC
  Administered 2016-12-07 (×3): 1 [drp] via OPHTHALMIC

## 2016-12-07 MED ORDER — KETOROLAC TROMETHAMINE 0.5 % OP SOLN
1.0000 [drp] | OPHTHALMIC | Status: AC
  Administered 2016-12-07 (×3): 1 [drp] via OPHTHALMIC

## 2016-12-07 SURGICAL SUPPLY — 10 items
CLOTH BEACON ORANGE TIMEOUT ST (SAFETY) ×2 IMPLANT
EYE SHIELD UNIVERSAL CLEAR (GAUZE/BANDAGES/DRESSINGS) ×2 IMPLANT
GLOVE BIOGEL PI IND STRL 6.5 (GLOVE) IMPLANT
GLOVE BIOGEL PI INDICATOR 6.5 (GLOVE) ×2
GLOVE EXAM NITRILE MD LF STRL (GLOVE) ×2 IMPLANT
PAD ARMBOARD 7.5X6 YLW CONV (MISCELLANEOUS) ×2 IMPLANT
SIGHTPATH CAT PROC W REG LENS (Ophthalmic Related) ×3 IMPLANT
TAPE SURG TRANSPORE 1 IN (GAUZE/BANDAGES/DRESSINGS) IMPLANT
TAPE SURGICAL TRANSPORE 1 IN (GAUZE/BANDAGES/DRESSINGS) ×2
WATER STERILE IRR 250ML POUR (IV SOLUTION) ×2 IMPLANT

## 2016-12-07 NOTE — Anesthesia Preprocedure Evaluation (Signed)
Anesthesia Evaluation  Patient identified by MRN, date of birth, ID band Patient awake    Reviewed: Allergy & Precautions, NPO status , Patient's Chart, lab work & pertinent test results  Airway Mallampati: III  TM Distance: >3 FB     Dental  (+) Edentulous Upper, Edentulous Lower   Pulmonary shortness of breath and with exertion, sleep apnea , former smoker,    breath sounds clear to auscultation       Cardiovascular negative cardio ROS   Rhythm:Regular Rate:Normal     Neuro/Psych    GI/Hepatic   Endo/Other  diabetes, Type 2, Oral Hypoglycemic Agents  Renal/GU      Musculoskeletal   Abdominal   Peds  Hematology   Anesthesia Other Findings   Reproductive/Obstetrics                             Anesthesia Physical Anesthesia Plan  ASA: III  Anesthesia Plan: MAC   Post-op Pain Management:    Induction: Intravenous  Airway Management Planned: Nasal Cannula  Additional Equipment:   Intra-op Plan:   Post-operative Plan:   Informed Consent: I have reviewed the patients History and Physical, chart, labs and discussed the procedure including the risks, benefits and alternatives for the proposed anesthesia with the patient or authorized representative who has indicated his/her understanding and acceptance.     Plan Discussed with:   Anesthesia Plan Comments:         Anesthesia Quick Evaluation  

## 2016-12-07 NOTE — Anesthesia Procedure Notes (Signed)
Procedure Name: MAC Date/Time: 12/07/2016 9:11 AM Performed by: Andree Elk, AMY A Pre-anesthesia Checklist: Patient identified, Timeout performed, Emergency Drugs available, Suction available and Patient being monitored Oxygen Delivery Method: Nasal cannula

## 2016-12-07 NOTE — H&P (Signed)
The patient was re examined and there is no change in the patients condition since the original H and P. 

## 2016-12-07 NOTE — Discharge Instructions (Signed)
°  °        [facility] Instructions °[address]- [place] [address] °    ° °1. Avoid closing eyes tightly. One often closes the eye tightly when laughing, talking, sneezing, coughing or if they feel irritated. At these times, you should be careful not to close your eyes tightly. ° °2. Instill eye drops as instructed. To instill drops in your eye, open it, look up and have someone gently pull the lower lid down and instill a couple of drops inside the lower lid. ° °3. Do not touch upper lid. ° °4. Take Advil or Tylenol for pain. ° °5. You may use either eye for near work, such as reading or sewing and you may watch television. ° °6. You may have your hair done at the beauty parlor at any time. ° °7. Wear dark glasses with or without your own glasses if you are in bright light. ° °8. Call our office at [phone] or [phone] if you have sharp pain in your eye or unusual symptoms. ° °9.  FOLLOW UP WITH DR. SHAPIRO TODAY IN HIS [place] OFFICE AT 2:45pm. ° °  °I have received a copy of the above instructions and will follow them.  ° ° ° °IF YOU ARE IN IMMEDIATE DANGER CALL 911! ° °It is important for you to keep your follow-up appointment with your physician after discharge, OR, for you /your caregiver to make a follow-up appointment with your physician / medical provider after discharge. ° °Show these instructions to the next healthcare provider you see. ° ° °Moderate Conscious Sedation, Adult, Care After °These instructions provide you with information about caring for yourself after your procedure. Your health care provider may also give you more specific instructions. Your treatment has been planned according to current medical practices, but problems sometimes occur. Call your health care provider if you have any problems or questions after your procedure. °What can I expect after the procedure? °After your procedure, it is common: °· To feel sleepy for several  hours. °· To feel clumsy and have poor balance for several hours. °· To have poor judgment for several hours. °· To vomit if you eat too soon. °Follow these instructions at home: °For at least 24 hours after the procedure:  ° °· Do not: °¨ Participate in activities where you could fall or become injured. °¨ Drive. °¨ Use heavy machinery. °¨ Drink alcohol. °¨ Take sleeping pills or medicines that cause drowsiness. °¨ Make important decisions or sign legal documents. °¨ Take care of children on your own. °· Rest. °Eating and drinking  °· Follow the diet recommended by your health care provider. °· If you vomit: °¨ Drink water, juice, or soup when you can drink without vomiting. °¨ Make sure you have little or no nausea before eating solid foods. °General instructions  °· Have a responsible adult stay with you until you are awake and alert. °· Take over-the-counter and prescription medicines only as told by your health care provider. °· If you smoke, do not smoke without supervision. °· Keep all follow-up visits as told by your health care provider. This is important. °Contact a health care provider if: °· You keep feeling nauseous or you keep vomiting. °· You feel light-headed. °· You develop a rash. °· You have a fever. °Get help right away if: °· You have trouble breathing. °This information is not intended to replace advice given to you by your health care provider. Make   any questions you have with your health care provider. °Document Released: 09/05/2013 Document Revised: 04/19/2016 Document Reviewed: 03/06/2016 °Elsevier Interactive Patient Education © 2017 Elsevier Inc. ° ° °

## 2016-12-07 NOTE — Anesthesia Postprocedure Evaluation (Signed)
Anesthesia Post Note  Patient: Micheal Parker  Procedure(s) Performed: Procedure(s) (LRB): CATARACT EXTRACTION PHACO AND INTRAOCULAR LENS PLACEMENT (IOC) (Right)  Patient location during evaluation: Short Stay Anesthesia Type: MAC Level of consciousness: awake and alert and oriented Pain management: pain level controlled Vital Signs Assessment: post-procedure vital signs reviewed and stable Respiratory status: spontaneous breathing Cardiovascular status: stable Postop Assessment: no signs of nausea or vomiting Anesthetic complications: no     Last Vitals:  Vitals:   12/07/16 0905 12/07/16 0908  BP: 113/60   Pulse:    Resp: 15 15  Temp:      Last Pain:  Vitals:   12/07/16 0816  TempSrc: Oral  PainSc: 7                  Rony Ratz A

## 2016-12-07 NOTE — Transfer of Care (Signed)
Immediate Anesthesia Transfer of Care Note  Patient: Micheal Parker  Procedure(s) Performed: Procedure(s) with comments: CATARACT EXTRACTION PHACO AND INTRAOCULAR LENS PLACEMENT (IOC) (Right) - CDE: 6.67  Patient Location: Short Stay  Anesthesia Type:MAC  Level of Consciousness: awake, alert , oriented and patient cooperative  Airway & Oxygen Therapy: Patient Spontanous Breathing  Post-op Assessment: Report given to RN and Post -op Vital signs reviewed and stable  Post vital signs: Reviewed and stable  Last Vitals:  Vitals:   12/07/16 0905 12/07/16 0908  BP: 113/60   Pulse:    Resp: 15 15  Temp:      Last Pain:  Vitals:   12/07/16 0816  TempSrc: Oral  PainSc: 7       Patients Stated Pain Goal: 8 (54/86/28 2417)  Complications: No apparent anesthesia complications

## 2016-12-07 NOTE — Op Note (Signed)
Patient brought to the operating room and prepped and draped in the usual manner.  Lid speculum inserted in right eye.  Stab incision made at the twelve o'clock position.  Provisc instilled in the anterior chamber.   A 2.4 mm. Stab incision was made temporally.  An anterior capsulotomy was done with a bent 25 gauge needle.  The nucleus was hydrodissected.  The Phaco tip was inserted in the anterior chamber and the nucleus was emulsified.  CDE was 6.67.  The cortical material was then removed with the I and A tip.  Posterior capsule was the polished.  The anterior chamber was deepened with Provisc.  A 22.5 Diopter Alcon AU00T0 IOL was then inserted in the capsular bag.  Provisc was then removed with the I and A tip.  The wound was then hydrated.  Patient sent to the Recovery Room in good condition with follow up in my office.  Preoperative Diagnosis:  Nuclear Cataract OD Postoperative Diagnosis:  Same Procedure name: Kelman Phacoemulsification OD with IOL

## 2016-12-08 ENCOUNTER — Encounter (HOSPITAL_COMMUNITY): Payer: Self-pay | Admitting: Ophthalmology

## 2016-12-16 ENCOUNTER — Encounter (HOSPITAL_COMMUNITY): Payer: Self-pay

## 2016-12-17 ENCOUNTER — Encounter (HOSPITAL_COMMUNITY)
Admission: RE | Admit: 2016-12-17 | Discharge: 2016-12-17 | Disposition: A | Discharge status: Home / Self Care | Payer: Medicare Other | Source: Ambulatory Visit | Attending: Ophthalmology | Admitting: Ophthalmology

## 2016-12-21 ENCOUNTER — Ambulatory Visit (HOSPITAL_COMMUNITY): Payer: Medicare Other | Admitting: Anesthesiology

## 2016-12-21 ENCOUNTER — Ambulatory Visit (HOSPITAL_COMMUNITY)
Admission: RE | Admit: 2016-12-21 | Discharge: 2016-12-21 | Disposition: A | Discharge status: Home / Self Care | Payer: Medicare Other | Source: Ambulatory Visit | Attending: Ophthalmology | Admitting: Ophthalmology

## 2016-12-21 ENCOUNTER — Encounter (HOSPITAL_COMMUNITY)
Admission: RE | Disposition: A | Discharge status: Home / Self Care | Payer: Self-pay | Source: Ambulatory Visit | Attending: Ophthalmology

## 2016-12-21 ENCOUNTER — Encounter (HOSPITAL_COMMUNITY): Payer: Self-pay

## 2016-12-21 DIAGNOSIS — G4730 Sleep apnea, unspecified: Secondary | ICD-10-CM | POA: Insufficient documentation

## 2016-12-21 DIAGNOSIS — H2512 Age-related nuclear cataract, left eye: Secondary | ICD-10-CM | POA: Diagnosis not present

## 2016-12-21 DIAGNOSIS — Z7984 Long term (current) use of oral hypoglycemic drugs: Secondary | ICD-10-CM | POA: Diagnosis not present

## 2016-12-21 DIAGNOSIS — E1136 Type 2 diabetes mellitus with diabetic cataract: Secondary | ICD-10-CM | POA: Insufficient documentation

## 2016-12-21 DIAGNOSIS — Z87891 Personal history of nicotine dependence: Secondary | ICD-10-CM | POA: Insufficient documentation

## 2016-12-21 HISTORY — PX: CATARACT EXTRACTION W/PHACO: SHX586

## 2016-12-21 LAB — GLUCOSE, CAPILLARY: GLUCOSE-CAPILLARY: 162 mg/dL — AB (ref 65–99)

## 2016-12-21 SURGERY — PHACOEMULSIFICATION, CATARACT, WITH IOL INSERTION
Anesthesia: Monitor Anesthesia Care | Site: Eye | Laterality: Left

## 2016-12-21 MED ORDER — BSS IO SOLN
INTRAOCULAR | Status: DC | PRN
Start: 2016-12-21 — End: 2016-12-21
  Administered 2016-12-21: 15 mL

## 2016-12-21 MED ORDER — KETOROLAC TROMETHAMINE 0.5 % OP SOLN
1.0000 [drp] | OPHTHALMIC | Status: AC
  Administered 2016-12-21 (×3): 1 [drp] via OPHTHALMIC

## 2016-12-21 MED ORDER — MIDAZOLAM HCL 2 MG/2ML IJ SOLN
0.5000 mg | INTRAMUSCULAR | Status: DC | PRN
  Administered 2016-12-21: 2 mg via INTRAVENOUS
  Filled 2016-12-21: qty 2

## 2016-12-21 MED ORDER — EPINEPHRINE PF 1 MG/ML IJ SOLN
INTRAMUSCULAR | Status: AC
  Filled 2016-12-21: qty 1

## 2016-12-21 MED ORDER — PROVISC 10 MG/ML IO SOLN
INTRAOCULAR | Status: DC | PRN
  Administered 2016-12-21: 0.85 mL via INTRAOCULAR

## 2016-12-21 MED ORDER — CYCLOPENTOLATE-PHENYLEPHRINE 0.2-1 % OP SOLN
1.0000 [drp] | OPHTHALMIC | Status: AC
  Administered 2016-12-21 (×3): 1 [drp] via OPHTHALMIC

## 2016-12-21 MED ORDER — TETRACAINE 0.5 % OP SOLN OPTIME - NO CHARGE
OPHTHALMIC | Status: DC | PRN
  Administered 2016-12-21: 2 [drp] via OPHTHALMIC

## 2016-12-21 MED ORDER — FENTANYL CITRATE (PF) 100 MCG/2ML IJ SOLN
25.0000 ug | INTRAMUSCULAR | Status: DC | PRN
  Administered 2016-12-21: 25 ug via INTRAVENOUS
  Filled 2016-12-21: qty 2

## 2016-12-21 MED ORDER — EPINEPHRINE PF 1 MG/ML IJ SOLN
INTRAOCULAR | Status: DC | PRN
  Administered 2016-12-21: 500 mL

## 2016-12-21 MED ORDER — MIDAZOLAM HCL 2 MG/2ML IJ SOLN
INTRAMUSCULAR | Status: AC
  Filled 2016-12-21: qty 2

## 2016-12-21 MED ORDER — LIDOCAINE HCL (PF) 1 % IJ SOLN
INTRAMUSCULAR | Status: AC
  Filled 2016-12-21: qty 2

## 2016-12-21 MED ORDER — MIDAZOLAM HCL 2 MG/2ML IJ SOLN
INTRAMUSCULAR | Status: DC | PRN
  Administered 2016-12-21 (×2): 1 mg via INTRAVENOUS

## 2016-12-21 MED ORDER — PHENYLEPHRINE HCL 2.5 % OP SOLN
1.0000 [drp] | OPHTHALMIC | Status: AC
  Administered 2016-12-21 (×3): 1 [drp] via OPHTHALMIC

## 2016-12-21 MED ORDER — LACTATED RINGERS IV SOLN
INTRAVENOUS | Status: DC
  Administered 2016-12-21: 07:00:00 via INTRAVENOUS

## 2016-12-21 MED ORDER — TETRACAINE HCL 0.5 % OP SOLN
1.0000 [drp] | OPHTHALMIC | Status: AC
  Administered 2016-12-21 (×3): 1 [drp] via OPHTHALMIC

## 2016-12-21 SURGICAL SUPPLY — 10 items

## 2016-12-21 NOTE — H&P (Signed)
The patient was re examined and there is no change in the patients condition since the original H and P. 

## 2016-12-21 NOTE — Anesthesia Procedure Notes (Signed)
Procedure Name: MAC Date/Time: 12/21/2016 7:20 AM Performed by: Vista Deck Pre-anesthesia Checklist: Patient identified, Emergency Drugs available, Suction available, Timeout performed and Patient being monitored Patient Re-evaluated:Patient Re-evaluated prior to inductionOxygen Delivery Method: Nasal Cannula

## 2016-12-21 NOTE — Transfer of Care (Signed)
Immediate Anesthesia Transfer of Care Note  Patient: Micheal Parker  Procedure(s) Performed: Procedure(s) with comments: CATARACT EXTRACTION PHACO AND INTRAOCULAR LENS PLACEMENT (IOC) (Left) - CDE: 5.72  Patient Location: Immediate Anesthesia Transfer of Care Note  Patient: Micheal Parker  Procedure(s) Performed: Procedure(s) (LRB): CATARACT EXTRACTION PHACO AND INTRAOCULAR LENS PLACEMENT (IOC) (Left)  Patient Location: Shortstay  Anesthesia Type: MAC  Level of Consciousness: awake  Airway & Oxygen Therapy: Patient Spontanous Breathing   Post-op Assessment: Report given to PACU RN, Post -op Vital signs reviewed and stable and Patient moving all extremities  Post vital signs: Reviewed and stable  Complications: No apparent anesthesia complications    Vitals:   12/21/16 0715 12/21/16 0720  BP: 140/74   Pulse:    Resp: 14 18  Temp:      Last Pain:  Vitals:   12/21/16 0656  TempSrc: Oral  PainSc: 0-No pain

## 2016-12-21 NOTE — Op Note (Signed)
Patient brought to the operating room and prepped and draped in the usual manner.  Lid speculum inserted in left eye.  Stab incision made at the twelve o'clock position.  Provisc instilled in the anterior chamber.   A 2.4 mm. Stab incision was made temporally.  An anterior capsulotomy was done with a bent 25 gauge needle.  The nucleus was hydrodissected.  The Phaco tip was inserted in the anterior chamber and the nucleus was emulsified.  CDE was 5.72.  The cortical material was then removed with the I and A tip.  Posterior capsule was the polished.  The anterior chamber was deepened with Provisc.  A 21.5 Diopter Alcon AU00T0 IOL was then inserted in the capsular bag.  Provisc was then removed with the I and A tip.  The wound was then hydrated.  Patient sent to the Recovery Room in good condition with follow up in my office.  Preoperative Diagnosis:  Nuclear Cataract OS Postoperative Diagnosis:  Same Procedure name: Kelman Phacoemulsification OS with IOL

## 2016-12-21 NOTE — Anesthesia Preprocedure Evaluation (Signed)
Anesthesia Evaluation  Patient identified by MRN, date of birth, ID band Patient awake    Reviewed: Allergy & Precautions, NPO status , Patient's Chart, lab work & pertinent test results  Airway Mallampati: III  TM Distance: >3 FB     Dental  (+) Edentulous Upper, Edentulous Lower   Pulmonary shortness of breath and with exertion, sleep apnea , former smoker,    breath sounds clear to auscultation       Cardiovascular negative cardio ROS   Rhythm:Regular Rate:Normal     Neuro/Psych    GI/Hepatic   Endo/Other  diabetes, Type 2, Oral Hypoglycemic Agents  Renal/GU      Musculoskeletal   Abdominal   Peds  Hematology   Anesthesia Other Findings   Reproductive/Obstetrics                             Anesthesia Physical Anesthesia Plan  ASA: III  Anesthesia Plan: MAC   Post-op Pain Management:    Induction: Intravenous  Airway Management Planned: Nasal Cannula  Additional Equipment:   Intra-op Plan:   Post-operative Plan:   Informed Consent: I have reviewed the patients History and Physical, chart, labs and discussed the procedure including the risks, benefits and alternatives for the proposed anesthesia with the patient or authorized representative who has indicated his/her understanding and acceptance.     Plan Discussed with:   Anesthesia Plan Comments:         Anesthesia Quick Evaluation

## 2016-12-21 NOTE — Discharge Instructions (Signed)
°  °        [facility] Instructions °[address]- [place] [address] °    ° °1. Avoid closing eyes tightly. One often closes the eye tightly when laughing, talking, sneezing, coughing or if they feel irritated. At these times, you should be careful not to close your eyes tightly. ° °2. Instill eye drops as instructed. To instill drops in your eye, open it, look up and have someone gently pull the lower lid down and instill a couple of drops inside the lower lid. ° °3. Do not touch upper lid. ° °4. Take Advil or Tylenol for pain. ° °5. You may use either eye for near work, such as reading or sewing and you may watch television. ° °6. You may have your hair done at the beauty parlor at any time. ° °7. Wear dark glasses with or without your own glasses if you are in bright light. ° °8. Call our office at [phone] or [phone] if you have sharp pain in your eye or unusual symptoms. ° °9.  FOLLOW UP WITH DR. SHAPIRO TODAY IN HIS [place] OFFICE AT 2:30pm. ° °  °I have received a copy of the above instructions and will follow them.  ° ° ° °IF YOU ARE IN IMMEDIATE DANGER CALL 911! ° °It is important for you to keep your follow-up appointment with your physician after discharge, OR, for you /your caregiver to make a follow-up appointment with your physician / medical provider after discharge. ° °Show these instructions to the next healthcare provider you see. ° ° °Moderate Conscious Sedation, Adult, Care After °These instructions provide you with information about caring for yourself after your procedure. Your health care provider may also give you more specific instructions. Your treatment has been planned according to current medical practices, but problems sometimes occur. Call your health care provider if you have any problems or questions after your procedure. °What can I expect after the procedure? °After your procedure, it is common: °· To feel sleepy for several  hours. °· To feel clumsy and have poor balance for several hours. °· To have poor judgment for several hours. °· To vomit if you eat too soon. °Follow these instructions at home: °For at least 24 hours after the procedure:  °· Do not: °¨ Participate in activities where you could fall or become injured. °¨ Drive. °¨ Use heavy machinery. °¨ Drink alcohol. °¨ Take sleeping pills or medicines that cause drowsiness. °¨ Make important decisions or sign legal documents. °¨ Take care of children on your own. °· Rest. °Eating and drinking °· Follow the diet recommended by your health care provider. °· If you vomit: °¨ Drink water, juice, or soup when you can drink without vomiting. °¨ Make sure you have little or no nausea before eating solid foods. °General instructions °· Have a responsible adult stay with you until you are awake and alert. °· Take over-the-counter and prescription medicines only as told by your health care provider. °· If you smoke, do not smoke without supervision. °· Keep all follow-up visits as told by your health care provider. This is important. °Contact a health care provider if: °· You keep feeling nauseous or you keep vomiting. °· You feel light-headed. °· You develop a rash. °· You have a fever. °Get help right away if: °· You have trouble breathing. °This information is not intended to replace advice given to you by your health care provider. Make sure you discuss   any questions you have with your health care provider. °Document Released: 09/05/2013 Document Revised: 04/19/2016 Document Reviewed: 03/06/2016 °Elsevier Interactive Patient Education © 2017 Elsevier Inc. ° °

## 2016-12-21 NOTE — Anesthesia Postprocedure Evaluation (Signed)
Anesthesia Post Note  Patient: Aarya Kirst  Procedure(s) Performed: Procedure(s) (LRB): CATARACT EXTRACTION PHACO AND INTRAOCULAR LENS PLACEMENT (IOC) (Left)  Patient location during evaluation: Short Stay Anesthesia Type: MAC Level of consciousness: awake and alert Pain management: satisfactory to patient Vital Signs Assessment: post-procedure vital signs reviewed and stable Respiratory status: spontaneous breathing Cardiovascular status: stable Anesthetic complications: no     Last Vitals:  Vitals:   12/21/16 0715 12/21/16 0720  BP: 140/74   Pulse:    Resp: 14 18  Temp:      Last Pain:  Vitals:   12/21/16 0656  TempSrc: Oral  PainSc: 0-No pain                 Quamir Willemsen

## 2016-12-22 ENCOUNTER — Encounter (HOSPITAL_COMMUNITY): Payer: Self-pay | Admitting: Ophthalmology

## 2016-12-22 DIAGNOSIS — M47812 Spondylosis without myelopathy or radiculopathy, cervical region: Secondary | ICD-10-CM | POA: Diagnosis not present

## 2016-12-22 DIAGNOSIS — M961 Postlaminectomy syndrome, not elsewhere classified: Secondary | ICD-10-CM | POA: Diagnosis not present

## 2017-01-04 DIAGNOSIS — H43392 Other vitreous opacities, left eye: Secondary | ICD-10-CM | POA: Diagnosis not present

## 2017-01-04 DIAGNOSIS — H3562 Retinal hemorrhage, left eye: Secondary | ICD-10-CM | POA: Diagnosis not present

## 2017-01-04 DIAGNOSIS — H4312 Vitreous hemorrhage, left eye: Secondary | ICD-10-CM | POA: Diagnosis not present

## 2017-01-04 DIAGNOSIS — H35042 Retinal micro-aneurysms, unspecified, left eye: Secondary | ICD-10-CM | POA: Diagnosis not present

## 2017-01-07 DIAGNOSIS — H44002 Unspecified purulent endophthalmitis, left eye: Secondary | ICD-10-CM | POA: Diagnosis not present

## 2017-01-07 DIAGNOSIS — E119 Type 2 diabetes mellitus without complications: Secondary | ICD-10-CM | POA: Diagnosis not present

## 2017-01-07 DIAGNOSIS — H4312 Vitreous hemorrhage, left eye: Secondary | ICD-10-CM | POA: Diagnosis not present

## 2017-01-07 DIAGNOSIS — H43392 Other vitreous opacities, left eye: Secondary | ICD-10-CM | POA: Diagnosis not present

## 2017-01-10 DIAGNOSIS — H44002 Unspecified purulent endophthalmitis, left eye: Secondary | ICD-10-CM | POA: Diagnosis not present

## 2017-01-10 DIAGNOSIS — H3562 Retinal hemorrhage, left eye: Secondary | ICD-10-CM | POA: Diagnosis not present

## 2017-01-10 DIAGNOSIS — H43392 Other vitreous opacities, left eye: Secondary | ICD-10-CM | POA: Diagnosis not present

## 2017-01-10 DIAGNOSIS — H4312 Vitreous hemorrhage, left eye: Secondary | ICD-10-CM | POA: Diagnosis not present

## 2017-01-12 DIAGNOSIS — H4419 Other endophthalmitis: Secondary | ICD-10-CM | POA: Diagnosis not present

## 2017-01-12 DIAGNOSIS — H4312 Vitreous hemorrhage, left eye: Secondary | ICD-10-CM | POA: Diagnosis not present

## 2017-01-12 DIAGNOSIS — Z0189 Encounter for other specified special examinations: Secondary | ICD-10-CM | POA: Diagnosis not present

## 2017-01-12 DIAGNOSIS — H44002 Unspecified purulent endophthalmitis, left eye: Secondary | ICD-10-CM | POA: Diagnosis not present

## 2017-01-17 DIAGNOSIS — Z9989 Dependence on other enabling machines and devices: Secondary | ICD-10-CM | POA: Diagnosis not present

## 2017-01-17 DIAGNOSIS — E119 Type 2 diabetes mellitus without complications: Secondary | ICD-10-CM | POA: Diagnosis not present

## 2017-01-17 DIAGNOSIS — Z87891 Personal history of nicotine dependence: Secondary | ICD-10-CM | POA: Diagnosis not present

## 2017-01-17 DIAGNOSIS — G4730 Sleep apnea, unspecified: Secondary | ICD-10-CM | POA: Diagnosis not present

## 2017-01-17 DIAGNOSIS — Z883 Allergy status to other anti-infective agents status: Secondary | ICD-10-CM | POA: Diagnosis not present

## 2017-01-17 DIAGNOSIS — Z79899 Other long term (current) drug therapy: Secondary | ICD-10-CM | POA: Diagnosis not present

## 2017-01-17 DIAGNOSIS — Z8719 Personal history of other diseases of the digestive system: Secondary | ICD-10-CM | POA: Diagnosis not present

## 2017-01-17 DIAGNOSIS — Z7951 Long term (current) use of inhaled steroids: Secondary | ICD-10-CM | POA: Diagnosis not present

## 2017-01-17 DIAGNOSIS — Z1211 Encounter for screening for malignant neoplasm of colon: Secondary | ICD-10-CM | POA: Diagnosis not present

## 2017-01-17 DIAGNOSIS — Z91041 Radiographic dye allergy status: Secondary | ICD-10-CM | POA: Diagnosis not present

## 2017-01-17 DIAGNOSIS — Z86010 Personal history of colonic polyps: Secondary | ICD-10-CM | POA: Diagnosis not present

## 2017-01-17 DIAGNOSIS — K5080 Crohn's disease of both small and large intestine without complications: Secondary | ICD-10-CM | POA: Diagnosis not present

## 2017-01-17 DIAGNOSIS — I10 Essential (primary) hypertension: Secondary | ICD-10-CM | POA: Diagnosis not present

## 2017-01-17 DIAGNOSIS — Z794 Long term (current) use of insulin: Secondary | ICD-10-CM | POA: Diagnosis not present

## 2017-01-17 DIAGNOSIS — Z7982 Long term (current) use of aspirin: Secondary | ICD-10-CM | POA: Diagnosis not present

## 2017-01-26 DIAGNOSIS — M79641 Pain in right hand: Secondary | ICD-10-CM | POA: Diagnosis not present

## 2017-01-26 DIAGNOSIS — M65311 Trigger thumb, right thumb: Secondary | ICD-10-CM | POA: Diagnosis not present

## 2017-01-26 DIAGNOSIS — M65321 Trigger finger, right index finger: Secondary | ICD-10-CM | POA: Diagnosis not present

## 2017-01-26 DIAGNOSIS — M79642 Pain in left hand: Secondary | ICD-10-CM | POA: Diagnosis not present

## 2017-01-26 DIAGNOSIS — M65342 Trigger finger, left ring finger: Secondary | ICD-10-CM | POA: Diagnosis not present

## 2017-02-11 DIAGNOSIS — K861 Other chronic pancreatitis: Secondary | ICD-10-CM | POA: Diagnosis not present

## 2017-02-11 DIAGNOSIS — E1165 Type 2 diabetes mellitus with hyperglycemia: Secondary | ICD-10-CM | POA: Diagnosis not present

## 2017-02-11 DIAGNOSIS — I10 Essential (primary) hypertension: Secondary | ICD-10-CM | POA: Diagnosis not present

## 2017-02-11 DIAGNOSIS — N4289 Other specified disorders of prostate: Secondary | ICD-10-CM | POA: Diagnosis not present

## 2017-02-11 DIAGNOSIS — G3184 Mild cognitive impairment, so stated: Secondary | ICD-10-CM | POA: Diagnosis not present

## 2017-02-28 DIAGNOSIS — M65342 Trigger finger, left ring finger: Secondary | ICD-10-CM | POA: Diagnosis not present

## 2017-03-09 DIAGNOSIS — M65342 Trigger finger, left ring finger: Secondary | ICD-10-CM | POA: Diagnosis not present

## 2017-03-10 DIAGNOSIS — G894 Chronic pain syndrome: Secondary | ICD-10-CM | POA: Diagnosis not present

## 2017-03-10 DIAGNOSIS — Z79891 Long term (current) use of opiate analgesic: Secondary | ICD-10-CM | POA: Diagnosis not present

## 2017-03-10 DIAGNOSIS — M47812 Spondylosis without myelopathy or radiculopathy, cervical region: Secondary | ICD-10-CM | POA: Diagnosis not present

## 2017-03-10 DIAGNOSIS — M961 Postlaminectomy syndrome, not elsewhere classified: Secondary | ICD-10-CM | POA: Diagnosis not present

## 2017-03-14 DIAGNOSIS — M65342 Trigger finger, left ring finger: Secondary | ICD-10-CM | POA: Diagnosis not present

## 2017-03-30 DIAGNOSIS — M47812 Spondylosis without myelopathy or radiculopathy, cervical region: Secondary | ICD-10-CM | POA: Diagnosis not present

## 2017-04-05 DIAGNOSIS — M961 Postlaminectomy syndrome, not elsewhere classified: Secondary | ICD-10-CM | POA: Diagnosis not present

## 2017-04-26 DIAGNOSIS — Z961 Presence of intraocular lens: Secondary | ICD-10-CM | POA: Diagnosis not present

## 2017-05-17 DIAGNOSIS — N4289 Other specified disorders of prostate: Secondary | ICD-10-CM | POA: Diagnosis not present

## 2017-05-17 DIAGNOSIS — G3184 Mild cognitive impairment, so stated: Secondary | ICD-10-CM | POA: Diagnosis not present

## 2017-05-17 DIAGNOSIS — I10 Essential (primary) hypertension: Secondary | ICD-10-CM | POA: Diagnosis not present

## 2017-05-17 DIAGNOSIS — E1165 Type 2 diabetes mellitus with hyperglycemia: Secondary | ICD-10-CM | POA: Diagnosis not present

## 2017-06-09 DIAGNOSIS — M47812 Spondylosis without myelopathy or radiculopathy, cervical region: Secondary | ICD-10-CM | POA: Diagnosis not present

## 2017-06-09 DIAGNOSIS — M961 Postlaminectomy syndrome, not elsewhere classified: Secondary | ICD-10-CM | POA: Diagnosis not present

## 2017-06-09 DIAGNOSIS — M47816 Spondylosis without myelopathy or radiculopathy, lumbar region: Secondary | ICD-10-CM | POA: Diagnosis not present

## 2017-06-09 DIAGNOSIS — G894 Chronic pain syndrome: Secondary | ICD-10-CM | POA: Diagnosis not present

## 2017-07-06 DIAGNOSIS — M25561 Pain in right knee: Secondary | ICD-10-CM | POA: Diagnosis not present

## 2017-07-06 DIAGNOSIS — G8929 Other chronic pain: Secondary | ICD-10-CM | POA: Diagnosis not present

## 2017-07-06 DIAGNOSIS — M238X1 Other internal derangements of right knee: Secondary | ICD-10-CM | POA: Diagnosis not present

## 2017-07-20 DIAGNOSIS — M47812 Spondylosis without myelopathy or radiculopathy, cervical region: Secondary | ICD-10-CM | POA: Diagnosis not present

## 2017-07-20 DIAGNOSIS — M47816 Spondylosis without myelopathy or radiculopathy, lumbar region: Secondary | ICD-10-CM | POA: Diagnosis not present

## 2017-07-20 DIAGNOSIS — M5417 Radiculopathy, lumbosacral region: Secondary | ICD-10-CM | POA: Diagnosis not present

## 2017-08-19 DIAGNOSIS — M238X1 Other internal derangements of right knee: Secondary | ICD-10-CM | POA: Diagnosis not present

## 2017-08-19 DIAGNOSIS — M25561 Pain in right knee: Secondary | ICD-10-CM | POA: Diagnosis not present

## 2017-08-19 DIAGNOSIS — G8929 Other chronic pain: Secondary | ICD-10-CM | POA: Diagnosis not present

## 2017-08-22 DIAGNOSIS — E1165 Type 2 diabetes mellitus with hyperglycemia: Secondary | ICD-10-CM | POA: Diagnosis not present

## 2017-08-22 DIAGNOSIS — I10 Essential (primary) hypertension: Secondary | ICD-10-CM | POA: Diagnosis not present

## 2017-08-22 DIAGNOSIS — N4289 Other specified disorders of prostate: Secondary | ICD-10-CM | POA: Diagnosis not present

## 2017-08-22 DIAGNOSIS — G3184 Mild cognitive impairment, so stated: Secondary | ICD-10-CM | POA: Diagnosis not present

## 2017-08-22 DIAGNOSIS — J3089 Other allergic rhinitis: Secondary | ICD-10-CM | POA: Diagnosis not present

## 2017-08-25 DIAGNOSIS — K5010 Crohn's disease of large intestine without complications: Secondary | ICD-10-CM | POA: Diagnosis not present

## 2017-08-25 DIAGNOSIS — K861 Other chronic pancreatitis: Secondary | ICD-10-CM | POA: Diagnosis not present

## 2017-08-25 DIAGNOSIS — K5900 Constipation, unspecified: Secondary | ICD-10-CM | POA: Diagnosis not present

## 2017-08-25 DIAGNOSIS — K5090 Crohn's disease, unspecified, without complications: Secondary | ICD-10-CM | POA: Diagnosis not present

## 2017-08-25 DIAGNOSIS — G8929 Other chronic pain: Secondary | ICD-10-CM | POA: Diagnosis not present

## 2017-09-14 DIAGNOSIS — Z23 Encounter for immunization: Secondary | ICD-10-CM | POA: Diagnosis not present

## 2017-10-05 DIAGNOSIS — M238X1 Other internal derangements of right knee: Secondary | ICD-10-CM | POA: Diagnosis not present

## 2017-10-05 DIAGNOSIS — G8929 Other chronic pain: Secondary | ICD-10-CM | POA: Diagnosis not present

## 2017-10-05 DIAGNOSIS — M25561 Pain in right knee: Secondary | ICD-10-CM | POA: Diagnosis not present

## 2017-10-10 DIAGNOSIS — M47816 Spondylosis without myelopathy or radiculopathy, lumbar region: Secondary | ICD-10-CM | POA: Diagnosis not present

## 2017-10-10 DIAGNOSIS — G894 Chronic pain syndrome: Secondary | ICD-10-CM | POA: Diagnosis not present

## 2017-10-10 DIAGNOSIS — M50323 Other cervical disc degeneration at C6-C7 level: Secondary | ICD-10-CM | POA: Diagnosis not present

## 2017-10-10 DIAGNOSIS — Z79891 Long term (current) use of opiate analgesic: Secondary | ICD-10-CM | POA: Diagnosis not present

## 2017-10-10 DIAGNOSIS — M961 Postlaminectomy syndrome, not elsewhere classified: Secondary | ICD-10-CM | POA: Diagnosis not present

## 2017-11-01 DIAGNOSIS — M5136 Other intervertebral disc degeneration, lumbar region: Secondary | ICD-10-CM | POA: Diagnosis not present

## 2017-11-01 DIAGNOSIS — M50323 Other cervical disc degeneration at C6-C7 level: Secondary | ICD-10-CM | POA: Diagnosis not present

## 2017-11-17 DIAGNOSIS — M65321 Trigger finger, right index finger: Secondary | ICD-10-CM | POA: Diagnosis not present

## 2017-11-18 DIAGNOSIS — E1165 Type 2 diabetes mellitus with hyperglycemia: Secondary | ICD-10-CM | POA: Diagnosis not present

## 2017-11-18 DIAGNOSIS — J3089 Other allergic rhinitis: Secondary | ICD-10-CM | POA: Diagnosis not present

## 2017-11-18 DIAGNOSIS — I10 Essential (primary) hypertension: Secondary | ICD-10-CM | POA: Diagnosis not present

## 2017-11-18 DIAGNOSIS — N4289 Other specified disorders of prostate: Secondary | ICD-10-CM | POA: Diagnosis not present

## 2017-11-18 DIAGNOSIS — G3184 Mild cognitive impairment, so stated: Secondary | ICD-10-CM | POA: Diagnosis not present

## 2017-11-25 DIAGNOSIS — R0600 Dyspnea, unspecified: Secondary | ICD-10-CM | POA: Diagnosis not present

## 2017-12-08 DIAGNOSIS — K5090 Crohn's disease, unspecified, without complications: Secondary | ICD-10-CM | POA: Diagnosis not present

## 2017-12-08 DIAGNOSIS — K861 Other chronic pancreatitis: Secondary | ICD-10-CM | POA: Diagnosis not present

## 2017-12-08 DIAGNOSIS — K50818 Crohn's disease of both small and large intestine with other complication: Secondary | ICD-10-CM | POA: Diagnosis not present

## 2017-12-13 DIAGNOSIS — K5080 Crohn's disease of both small and large intestine without complications: Secondary | ICD-10-CM | POA: Diagnosis not present

## 2017-12-13 DIAGNOSIS — K5090 Crohn's disease, unspecified, without complications: Secondary | ICD-10-CM | POA: Diagnosis not present

## 2017-12-13 DIAGNOSIS — K529 Noninfective gastroenteritis and colitis, unspecified: Secondary | ICD-10-CM | POA: Diagnosis not present

## 2017-12-13 DIAGNOSIS — K641 Second degree hemorrhoids: Secondary | ICD-10-CM | POA: Diagnosis not present

## 2017-12-13 DIAGNOSIS — K50818 Crohn's disease of both small and large intestine with other complication: Secondary | ICD-10-CM | POA: Diagnosis not present

## 2017-12-13 DIAGNOSIS — K861 Other chronic pancreatitis: Secondary | ICD-10-CM | POA: Diagnosis not present

## 2018-01-10 DIAGNOSIS — B880 Other acariasis: Secondary | ICD-10-CM | POA: Diagnosis not present

## 2018-01-30 DIAGNOSIS — M542 Cervicalgia: Secondary | ICD-10-CM | POA: Diagnosis not present

## 2018-01-30 DIAGNOSIS — M5030 Other cervical disc degeneration, unspecified cervical region: Secondary | ICD-10-CM | POA: Diagnosis not present

## 2018-01-30 DIAGNOSIS — Z79891 Long term (current) use of opiate analgesic: Secondary | ICD-10-CM | POA: Diagnosis not present

## 2018-01-30 DIAGNOSIS — M5136 Other intervertebral disc degeneration, lumbar region: Secondary | ICD-10-CM | POA: Diagnosis not present

## 2018-01-30 DIAGNOSIS — M545 Low back pain: Secondary | ICD-10-CM | POA: Diagnosis not present

## 2018-01-30 DIAGNOSIS — Z79899 Other long term (current) drug therapy: Secondary | ICD-10-CM | POA: Diagnosis not present

## 2018-02-08 DIAGNOSIS — H44002 Unspecified purulent endophthalmitis, left eye: Secondary | ICD-10-CM | POA: Diagnosis not present

## 2018-02-08 DIAGNOSIS — H3562 Retinal hemorrhage, left eye: Secondary | ICD-10-CM | POA: Diagnosis not present

## 2018-02-08 DIAGNOSIS — H43392 Other vitreous opacities, left eye: Secondary | ICD-10-CM | POA: Diagnosis not present

## 2018-02-08 DIAGNOSIS — E119 Type 2 diabetes mellitus without complications: Secondary | ICD-10-CM | POA: Diagnosis not present

## 2018-02-14 DIAGNOSIS — M5030 Other cervical disc degeneration, unspecified cervical region: Secondary | ICD-10-CM | POA: Diagnosis not present

## 2018-02-14 DIAGNOSIS — M5033 Other cervical disc degeneration, cervicothoracic region: Secondary | ICD-10-CM | POA: Diagnosis not present

## 2018-02-14 DIAGNOSIS — M5136 Other intervertebral disc degeneration, lumbar region: Secondary | ICD-10-CM | POA: Diagnosis not present

## 2018-02-20 DIAGNOSIS — B880 Other acariasis: Secondary | ICD-10-CM | POA: Diagnosis not present

## 2018-02-20 DIAGNOSIS — E0849 Diabetes mellitus due to underlying condition with other diabetic neurological complication: Secondary | ICD-10-CM | POA: Diagnosis not present

## 2018-02-20 DIAGNOSIS — L308 Other specified dermatitis: Secondary | ICD-10-CM | POA: Diagnosis not present

## 2018-02-20 DIAGNOSIS — N400 Enlarged prostate without lower urinary tract symptoms: Secondary | ICD-10-CM | POA: Diagnosis not present

## 2018-02-20 DIAGNOSIS — B3789 Other sites of candidiasis: Secondary | ICD-10-CM | POA: Diagnosis not present

## 2018-02-20 DIAGNOSIS — E1149 Type 2 diabetes mellitus with other diabetic neurological complication: Secondary | ICD-10-CM | POA: Diagnosis not present

## 2018-02-20 DIAGNOSIS — I10 Essential (primary) hypertension: Secondary | ICD-10-CM | POA: Diagnosis not present

## 2018-02-20 DIAGNOSIS — G4733 Obstructive sleep apnea (adult) (pediatric): Secondary | ICD-10-CM | POA: Diagnosis not present

## 2018-02-23 DIAGNOSIS — K50818 Crohn's disease of both small and large intestine with other complication: Secondary | ICD-10-CM | POA: Diagnosis not present

## 2018-02-23 DIAGNOSIS — Z79899 Other long term (current) drug therapy: Secondary | ICD-10-CM | POA: Diagnosis not present

## 2018-02-23 DIAGNOSIS — Z9889 Other specified postprocedural states: Secondary | ICD-10-CM | POA: Diagnosis not present

## 2018-02-23 DIAGNOSIS — Z8719 Personal history of other diseases of the digestive system: Secondary | ICD-10-CM | POA: Diagnosis not present

## 2018-02-23 DIAGNOSIS — Z888 Allergy status to other drugs, medicaments and biological substances status: Secondary | ICD-10-CM | POA: Diagnosis not present

## 2018-02-23 DIAGNOSIS — K861 Other chronic pancreatitis: Secondary | ICD-10-CM | POA: Diagnosis not present

## 2018-05-02 DIAGNOSIS — H40013 Open angle with borderline findings, low risk, bilateral: Secondary | ICD-10-CM | POA: Diagnosis not present

## 2018-05-02 DIAGNOSIS — E119 Type 2 diabetes mellitus without complications: Secondary | ICD-10-CM | POA: Diagnosis not present

## 2018-05-02 DIAGNOSIS — Z961 Presence of intraocular lens: Secondary | ICD-10-CM | POA: Diagnosis not present

## 2018-05-02 DIAGNOSIS — Z794 Long term (current) use of insulin: Secondary | ICD-10-CM | POA: Diagnosis not present

## 2018-05-17 DIAGNOSIS — M542 Cervicalgia: Secondary | ICD-10-CM | POA: Diagnosis not present

## 2018-05-17 DIAGNOSIS — M545 Low back pain: Secondary | ICD-10-CM | POA: Diagnosis not present

## 2018-05-17 DIAGNOSIS — M5136 Other intervertebral disc degeneration, lumbar region: Secondary | ICD-10-CM | POA: Diagnosis not present

## 2018-05-17 DIAGNOSIS — Z79899 Other long term (current) drug therapy: Secondary | ICD-10-CM | POA: Diagnosis not present

## 2018-05-29 DIAGNOSIS — B3789 Other sites of candidiasis: Secondary | ICD-10-CM | POA: Diagnosis not present

## 2018-05-29 DIAGNOSIS — Z125 Encounter for screening for malignant neoplasm of prostate: Secondary | ICD-10-CM | POA: Diagnosis not present

## 2018-05-29 DIAGNOSIS — G4733 Obstructive sleep apnea (adult) (pediatric): Secondary | ICD-10-CM | POA: Diagnosis not present

## 2018-05-29 DIAGNOSIS — E1149 Type 2 diabetes mellitus with other diabetic neurological complication: Secondary | ICD-10-CM | POA: Diagnosis not present

## 2018-05-29 DIAGNOSIS — N400 Enlarged prostate without lower urinary tract symptoms: Secondary | ICD-10-CM | POA: Diagnosis not present

## 2018-05-29 DIAGNOSIS — L308 Other specified dermatitis: Secondary | ICD-10-CM | POA: Diagnosis not present

## 2018-05-29 DIAGNOSIS — Z1389 Encounter for screening for other disorder: Secondary | ICD-10-CM | POA: Diagnosis not present

## 2018-05-29 DIAGNOSIS — Z0000 Encounter for general adult medical examination without abnormal findings: Secondary | ICD-10-CM | POA: Diagnosis not present

## 2018-05-29 DIAGNOSIS — I10 Essential (primary) hypertension: Secondary | ICD-10-CM | POA: Diagnosis not present

## 2018-06-08 DIAGNOSIS — M5136 Other intervertebral disc degeneration, lumbar region: Secondary | ICD-10-CM | POA: Diagnosis not present

## 2018-06-08 DIAGNOSIS — M5030 Other cervical disc degeneration, unspecified cervical region: Secondary | ICD-10-CM | POA: Diagnosis not present

## 2018-06-29 DIAGNOSIS — M79644 Pain in right finger(s): Secondary | ICD-10-CM | POA: Diagnosis not present

## 2018-06-29 DIAGNOSIS — M65321 Trigger finger, right index finger: Secondary | ICD-10-CM | POA: Diagnosis not present

## 2018-08-25 DIAGNOSIS — Z23 Encounter for immunization: Secondary | ICD-10-CM | POA: Diagnosis not present

## 2018-08-29 DIAGNOSIS — N400 Enlarged prostate without lower urinary tract symptoms: Secondary | ICD-10-CM | POA: Diagnosis not present

## 2018-08-29 DIAGNOSIS — I10 Essential (primary) hypertension: Secondary | ICD-10-CM | POA: Diagnosis not present

## 2018-08-29 DIAGNOSIS — E1149 Type 2 diabetes mellitus with other diabetic neurological complication: Secondary | ICD-10-CM | POA: Diagnosis not present

## 2018-08-29 DIAGNOSIS — G4733 Obstructive sleep apnea (adult) (pediatric): Secondary | ICD-10-CM | POA: Diagnosis not present

## 2018-09-11 DIAGNOSIS — M5030 Other cervical disc degeneration, unspecified cervical region: Secondary | ICD-10-CM | POA: Diagnosis not present

## 2018-09-11 DIAGNOSIS — M545 Low back pain: Secondary | ICD-10-CM | POA: Diagnosis not present

## 2018-09-11 DIAGNOSIS — Z79899 Other long term (current) drug therapy: Secondary | ICD-10-CM | POA: Diagnosis not present

## 2018-09-11 DIAGNOSIS — M5136 Other intervertebral disc degeneration, lumbar region: Secondary | ICD-10-CM | POA: Diagnosis not present

## 2018-09-11 DIAGNOSIS — Z79891 Long term (current) use of opiate analgesic: Secondary | ICD-10-CM | POA: Diagnosis not present

## 2018-09-19 DIAGNOSIS — E291 Testicular hypofunction: Secondary | ICD-10-CM | POA: Diagnosis present

## 2018-09-19 DIAGNOSIS — Z91041 Radiographic dye allergy status: Secondary | ICD-10-CM | POA: Diagnosis not present

## 2018-09-19 DIAGNOSIS — K5730 Diverticulosis of large intestine without perforation or abscess without bleeding: Secondary | ICD-10-CM | POA: Diagnosis not present

## 2018-09-19 DIAGNOSIS — K449 Diaphragmatic hernia without obstruction or gangrene: Secondary | ICD-10-CM | POA: Diagnosis present

## 2018-09-19 DIAGNOSIS — N400 Enlarged prostate without lower urinary tract symptoms: Secondary | ICD-10-CM | POA: Diagnosis present

## 2018-09-19 DIAGNOSIS — E1165 Type 2 diabetes mellitus with hyperglycemia: Secondary | ICD-10-CM | POA: Diagnosis present

## 2018-09-19 DIAGNOSIS — M545 Low back pain: Secondary | ICD-10-CM | POA: Diagnosis present

## 2018-09-19 DIAGNOSIS — R109 Unspecified abdominal pain: Secondary | ICD-10-CM | POA: Diagnosis not present

## 2018-09-19 DIAGNOSIS — G8929 Other chronic pain: Secondary | ICD-10-CM | POA: Diagnosis present

## 2018-09-19 DIAGNOSIS — Z794 Long term (current) use of insulin: Secondary | ICD-10-CM | POA: Diagnosis not present

## 2018-09-19 DIAGNOSIS — J310 Chronic rhinitis: Secondary | ICD-10-CM | POA: Diagnosis present

## 2018-09-19 DIAGNOSIS — K861 Other chronic pancreatitis: Secondary | ICD-10-CM | POA: Diagnosis present

## 2018-09-19 DIAGNOSIS — K5900 Constipation, unspecified: Secondary | ICD-10-CM | POA: Diagnosis present

## 2018-09-19 DIAGNOSIS — K760 Fatty (change of) liver, not elsewhere classified: Secondary | ICD-10-CM | POA: Diagnosis not present

## 2018-09-19 DIAGNOSIS — N281 Cyst of kidney, acquired: Secondary | ICD-10-CM | POA: Diagnosis not present

## 2018-09-19 DIAGNOSIS — Z7982 Long term (current) use of aspirin: Secondary | ICD-10-CM | POA: Diagnosis not present

## 2018-09-25 DIAGNOSIS — M65322 Trigger finger, left index finger: Secondary | ICD-10-CM | POA: Diagnosis not present

## 2018-09-25 DIAGNOSIS — M79642 Pain in left hand: Secondary | ICD-10-CM | POA: Diagnosis not present

## 2018-10-03 DIAGNOSIS — M5136 Other intervertebral disc degeneration, lumbar region: Secondary | ICD-10-CM | POA: Diagnosis not present

## 2018-10-03 DIAGNOSIS — M5030 Other cervical disc degeneration, unspecified cervical region: Secondary | ICD-10-CM | POA: Diagnosis not present

## 2018-10-18 DIAGNOSIS — M25551 Pain in right hip: Secondary | ICD-10-CM | POA: Diagnosis not present

## 2018-10-18 DIAGNOSIS — M545 Low back pain: Secondary | ICD-10-CM | POA: Diagnosis not present

## 2018-10-18 DIAGNOSIS — M5136 Other intervertebral disc degeneration, lumbar region: Secondary | ICD-10-CM | POA: Diagnosis not present

## 2018-10-23 DIAGNOSIS — M65322 Trigger finger, left index finger: Secondary | ICD-10-CM | POA: Diagnosis not present

## 2018-10-28 DIAGNOSIS — M25551 Pain in right hip: Secondary | ICD-10-CM | POA: Diagnosis not present

## 2018-10-28 DIAGNOSIS — M545 Low back pain: Secondary | ICD-10-CM | POA: Diagnosis not present

## 2018-11-08 DIAGNOSIS — M5136 Other intervertebral disc degeneration, lumbar region: Secondary | ICD-10-CM | POA: Diagnosis not present

## 2018-11-08 DIAGNOSIS — Z79891 Long term (current) use of opiate analgesic: Secondary | ICD-10-CM | POA: Diagnosis not present

## 2018-11-08 DIAGNOSIS — M5030 Other cervical disc degeneration, unspecified cervical region: Secondary | ICD-10-CM | POA: Diagnosis not present

## 2018-11-08 DIAGNOSIS — M545 Low back pain: Secondary | ICD-10-CM | POA: Diagnosis not present

## 2018-11-08 DIAGNOSIS — M5134 Other intervertebral disc degeneration, thoracic region: Secondary | ICD-10-CM | POA: Diagnosis not present

## 2018-11-08 DIAGNOSIS — M542 Cervicalgia: Secondary | ICD-10-CM | POA: Diagnosis not present

## 2018-11-08 DIAGNOSIS — M961 Postlaminectomy syndrome, not elsewhere classified: Secondary | ICD-10-CM | POA: Diagnosis not present

## 2018-11-16 DIAGNOSIS — R070 Pain in throat: Secondary | ICD-10-CM | POA: Diagnosis not present

## 2018-11-16 DIAGNOSIS — J189 Pneumonia, unspecified organism: Secondary | ICD-10-CM | POA: Diagnosis not present

## 2018-11-16 DIAGNOSIS — H9203 Otalgia, bilateral: Secondary | ICD-10-CM | POA: Diagnosis not present

## 2018-11-16 DIAGNOSIS — H6123 Impacted cerumen, bilateral: Secondary | ICD-10-CM | POA: Diagnosis not present

## 2018-11-16 DIAGNOSIS — J209 Acute bronchitis, unspecified: Secondary | ICD-10-CM | POA: Diagnosis not present

## 2018-11-20 DIAGNOSIS — M5136 Other intervertebral disc degeneration, lumbar region: Secondary | ICD-10-CM | POA: Diagnosis not present

## 2018-11-20 DIAGNOSIS — M545 Low back pain: Secondary | ICD-10-CM | POA: Diagnosis not present

## 2018-11-20 DIAGNOSIS — M961 Postlaminectomy syndrome, not elsewhere classified: Secondary | ICD-10-CM | POA: Diagnosis not present

## 2018-11-27 ENCOUNTER — Ambulatory Visit (HOSPITAL_COMMUNITY): Payer: Self-pay | Admitting: Orthopedic Surgery

## 2018-12-05 DIAGNOSIS — I10 Essential (primary) hypertension: Secondary | ICD-10-CM | POA: Diagnosis not present

## 2018-12-05 DIAGNOSIS — E119 Type 2 diabetes mellitus without complications: Secondary | ICD-10-CM | POA: Diagnosis not present

## 2018-12-05 DIAGNOSIS — K8582 Other acute pancreatitis with infected necrosis: Secondary | ICD-10-CM | POA: Diagnosis not present

## 2018-12-05 DIAGNOSIS — G4730 Sleep apnea, unspecified: Secondary | ICD-10-CM | POA: Diagnosis not present

## 2018-12-05 DIAGNOSIS — E1165 Type 2 diabetes mellitus with hyperglycemia: Secondary | ICD-10-CM | POA: Diagnosis not present

## 2018-12-19 ENCOUNTER — Encounter (HOSPITAL_COMMUNITY): Payer: Self-pay

## 2018-12-19 ENCOUNTER — Encounter (HOSPITAL_COMMUNITY)
Admission: RE | Admit: 2018-12-19 | Discharge: 2018-12-19 | Disposition: A | Discharge status: Home / Self Care | Payer: Medicare Other | Source: Ambulatory Visit | Attending: Orthopedic Surgery | Admitting: Orthopedic Surgery

## 2018-12-19 DIAGNOSIS — M5136 Other intervertebral disc degeneration, lumbar region: Secondary | ICD-10-CM | POA: Diagnosis not present

## 2018-12-19 DIAGNOSIS — M961 Postlaminectomy syndrome, not elsewhere classified: Secondary | ICD-10-CM | POA: Diagnosis not present

## 2018-12-19 DIAGNOSIS — M545 Low back pain: Secondary | ICD-10-CM | POA: Diagnosis not present

## 2018-12-19 DIAGNOSIS — M5416 Radiculopathy, lumbar region: Secondary | ICD-10-CM | POA: Diagnosis not present

## 2018-12-19 DIAGNOSIS — E118 Type 2 diabetes mellitus with unspecified complications: Secondary | ICD-10-CM | POA: Diagnosis not present

## 2018-12-19 DIAGNOSIS — Z01818 Encounter for other preprocedural examination: Secondary | ICD-10-CM | POA: Diagnosis not present

## 2018-12-19 LAB — CBC
HCT: 61.5 % — ABNORMAL HIGH (ref 39.0–52.0)
Hemoglobin: 19.3 g/dL — ABNORMAL HIGH (ref 13.0–17.0)
MCH: 28.4 pg (ref 26.0–34.0)
MCHC: 31.4 g/dL (ref 30.0–36.0)
MCV: 90.4 fL (ref 80.0–100.0)
Platelets: 221 10*3/uL (ref 150–400)
RBC: 6.8 MIL/uL — ABNORMAL HIGH (ref 4.22–5.81)
RDW: 15 % (ref 11.5–15.5)
WBC: 11 10*3/uL — ABNORMAL HIGH (ref 4.0–10.5)
nRBC: 0 % (ref 0.0–0.2)

## 2018-12-19 LAB — BASIC METABOLIC PANEL
Anion gap: 12 (ref 5–15)
BUN: 12 mg/dL (ref 8–23)
CO2: 24 mmol/L (ref 22–32)
Calcium: 9.7 mg/dL (ref 8.9–10.3)
Chloride: 103 mmol/L (ref 98–111)
Creatinine, Ser: 1.24 mg/dL (ref 0.61–1.24)
GFR calc Af Amer: >60 mL/min (ref >60)
GFR calc non Af Amer: 58 mL/min — ABNORMAL LOW (ref >60)
Glucose, Bld: 89 mg/dL (ref 70–99)
Potassium: 4.2 mmol/L (ref 3.5–5.1)
Sodium: 139 mmol/L (ref 135–145)

## 2018-12-19 LAB — SURGICAL PCR SCREEN
MRSA, PCR: NEGATIVE
Staphylococcus aureus: NEGATIVE

## 2018-12-19 LAB — TYPE AND SCREEN
ABO/RH(D): B POS
Antibody Screen: NEGATIVE

## 2018-12-19 LAB — HEMOGLOBIN A1C
Hgb A1c MFr Bld: 6.8 % — ABNORMAL HIGH (ref 4.8–5.6)
Mean Plasma Glucose: 148.46 mg/dL

## 2018-12-19 LAB — GLUCOSE, CAPILLARY: Glucose-Capillary: 97 mg/dL (ref 70–99)

## 2018-12-19 LAB — ABO/RH: ABO/RH(D): B POS

## 2018-12-19 NOTE — Progress Notes (Signed)
PCP - DR Sherrie Sport  [place] Cardiologist - denies  Chest x-ray -denies  EKG - 12/19/18 Stress Test -1007   normal ECHO - denies Cardiac Cath - 1915  No stents  Sleep Study - 2017 CPAP - yes  Fasting Blood Sugar - 97 Checks Blood Sugar _5x____ times a day  Aspirin Instructions: stop  Anesthesia review: not at this time  Patient denies shortness of breath, fever, cough and chest pain at PAT appointment   Patient verbalized understanding of instructions that were given to them at the PAT appointment. Patient was also instructed that they will need to review over the PAT instructions again at home before surgery.

## 2018-12-19 NOTE — Pre-Procedure Instructions (Signed)
Micheal Parker  12/19/2018      [place] [address] Phone: [phone] Fax: [phone]  [id number] - [place], [place] - [address] [place] [address] Phone: [phone] Fax: [phone]    Your procedure is scheduled on 12/27/18.  Report to [facility] Admitting at 7 A.M.  Call this number if you have problems the morning of surgery:  [phone]   Remember:  D Take these medicines the morning of surgery with A SIP OF WATER ---all inhalers,cymbalta,neurontin,percocet,flomax    Do not wear jewelry, make-up or nail polish.  Do not wear lotions, powders, or perfumes, or deodorant.  Do not shave 48 hours prior to surgery.  Men may shave face and neck.  Do not bring valuables to the hospital.  [facility] is not responsible for any belongings or valuables.  Contacts, dentures or bridgework may not be worn into surgery.  Leave your suitcase in the car.  After surgery it may be brought to your room.  For patients admitted to the hospital, discharge time will be determined by your treatment team.  Patients discharged the day of surgery will not be allowed to drive home.   Name and phone number of your driver:   Do not take any aspirin,anti-inflammatories,vitamins,or herbal supplements 5-7 days prior to surgery. Special instructions:  [place] - Preparing for Surgery  Before surgery, you can play an important role.  Because skin is not sterile, your skin needs to be as free of germs as possible.  You can reduce the number of germs on you skin by washing with CHG (chlorahexidine gluconate) soap before surgery.  CHG is an antiseptic cleaner which kills germs and bonds with the skin to continue killing germs even after washing.  Oral Hygiene is also important in reducing the risk of infection.  Remember to brush your teeth with your regular  toothpaste the morning of surgery.  Please DO NOT use if you have an allergy to CHG or antibacterial soaps.  If your skin becomes reddened/irritated stop using the CHG and inform your nurse when you arrive at Short Stay.  Do not shave (including legs and underarms) for at least 48 hours prior to the first CHG shower.  You may shave your face.  Please follow these instructions carefully:   1.  Shower with CHG Soap the night before surgery and the morning of Surgery.  2.  If you choose to wash your hair, wash your hair first as usual with your normal shampoo.  3.  After you shampoo, rinse your hair and body thoroughly to remove the shampoo. 4.  Use CHG as you would any other liquid soap.  You can apply chg directly to the skin and wash gently with a      scrungie or washcloth.           5.  Apply the CHG Soap to your body ONLY FROM THE NECK DOWN.   Do not use on open wounds or open sores. Avoid contact with your eyes, ears, mouth and genitals (private parts).  Wash genitals (private parts) with your normal soap.  6.  Wash thoroughly, paying special attention to the area where your surgery will be performed.  7.  Thoroughly rinse your body with warm water from the neck down.  8.  DO NOT shower/wash with your normal soap after using and rinsing off the CHG Soap.  9.  Pat yourself dry with a clean towel.            10.  Wear clean pajamas.            11.  Place clean sheets on your bed the night of your first shower and do not sleep with pets.  Day of Surgery  Do not apply any lotions/deoderants the morning of surgery.   Please wear clean clothes to the hospital/surgery center. Remember to brush your teeth with toothpaste.    Please read over the following fact sheets that you were given. MRSA Information    How to Manage Your Diabetes Before and After Surgery  Why is it important to control my blood sugar before and after surgery? . Improving blood sugar levels before and after  surgery helps healing and can limit problems. . A way of improving blood sugar control is eating a healthy diet by: o  Eating less sugar and carbohydrates o  Increasing activity/exercise o  Talking with your doctor about reaching your blood sugar goals . High blood sugars (greater than 180 mg/dL) can raise your risk of infections and slow your recovery, so you will need to focus on controlling your diabetes during the weeks before surgery. . Make sure that the doctor who takes care of your diabetes knows about your planned surgery including the date and location.  How do I manage my blood sugar before surgery? . Check your blood sugar at least 4 times a day, starting 2 days before surgery, to make sure that the level is not too high or low. o Check your blood sugar the morning of your surgery when you wake up and every 2 hours until you get to the Short Stay unit. . If your blood sugar is less than 70 mg/dL, you will need to treat for low blood sugar: o Do not take insulin. o Treat a low blood sugar (less than 70 mg/dL) with  cup of clear juice (cranberry or apple), 4 glucose tablets, OR glucose gel. Recheck blood sugar in 15 minutes after treatment (to make sure it is greater than 70 mg/dL). If your blood sugar is not greater than 70 mg/dL on recheck, call [phone] o  for further instructions. . Report your blood sugar to the short stay nurse when you get to Short Stay.  . If you are admitted to the hospital after surgery: o Your blood sugar will be checked by the staff and you will probably be given insulin after surgery (instead of oral diabetes medicines) to make sure you have good blood sugar levels. o The goal for blood sugar control after surgery is 80-180 mg/dL.              WHAT DO I DO ABOUT MY DIABETES MEDICATION?   Marland Kitchen Do not take oral diabetes medicines (pills) the morning of surgery.  . THE NIGHT BEFORE SURGERY, take _______30____ units of  ______lantus_____insulin.      lantus . THE MORNING OF SURGERY, take _______30______ units of ____lantus______insulin.  . The day of surgery, do not take other diabetes injectables, including Byetta (exenatide), Bydureon (exenatide ER), Victoza (liraglutide), or Trulicity (dulaglutide). Do not take Glucophage  Day of surgery   . If your CBG is greater than 220 mg/dL, you may take  of your sliding scale (correction) dose of insulin.  Other Instructions:          Patient Signature:  Date:   Nurse Signature:  Date:   Reviewed  and Endorsed by [facility] Patient Education Committee, August 2015

## 2018-12-26 ENCOUNTER — Encounter (HOSPITAL_COMMUNITY): Payer: Self-pay | Admitting: Anesthesiology

## 2018-12-26 MED ORDER — TRANEXAMIC ACID-NACL 1000-0.7 MG/100ML-% IV SOLN
1000.0000 mg | INTRAVENOUS | Status: AC
  Administered 2018-12-27: 1000 mg via INTRAVENOUS
  Filled 2018-12-26: qty 100

## 2018-12-27 ENCOUNTER — Encounter (HOSPITAL_COMMUNITY)
Admission: RE | Disposition: A | Discharge status: Home / Self Care | Payer: Self-pay | Source: Home / Self Care | Attending: Orthopedic Surgery

## 2018-12-27 ENCOUNTER — Other Ambulatory Visit: Payer: Self-pay

## 2018-12-27 ENCOUNTER — Inpatient Hospital Stay (HOSPITAL_COMMUNITY)
Admission: RE | Admit: 2018-12-27 | Discharge: 2018-12-29 | DRG: 460 | Disposition: A | Discharge status: Home / Self Care | Payer: Medicare Other | Attending: Orthopedic Surgery | Admitting: Orthopedic Surgery

## 2018-12-27 ENCOUNTER — Inpatient Hospital Stay (HOSPITAL_COMMUNITY): Payer: Medicare Other | Admitting: Vascular Surgery

## 2018-12-27 ENCOUNTER — Inpatient Hospital Stay (HOSPITAL_COMMUNITY): Payer: Medicare Other | Admitting: Anesthesiology

## 2018-12-27 ENCOUNTER — Encounter (HOSPITAL_COMMUNITY): Payer: Self-pay | Admitting: Certified Registered Nurse Anesthetist

## 2018-12-27 ENCOUNTER — Inpatient Hospital Stay (HOSPITAL_COMMUNITY): Payer: Medicare Other

## 2018-12-27 DIAGNOSIS — M2578 Osteophyte, vertebrae: Secondary | ICD-10-CM | POA: Diagnosis present

## 2018-12-27 DIAGNOSIS — Z794 Long term (current) use of insulin: Secondary | ICD-10-CM

## 2018-12-27 DIAGNOSIS — Z419 Encounter for procedure for purposes other than remedying health state, unspecified: Secondary | ICD-10-CM

## 2018-12-27 DIAGNOSIS — Z79891 Long term (current) use of opiate analgesic: Secondary | ICD-10-CM

## 2018-12-27 DIAGNOSIS — M5116 Intervertebral disc disorders with radiculopathy, lumbar region: Principal | ICD-10-CM | POA: Diagnosis present

## 2018-12-27 DIAGNOSIS — R262 Difficulty in walking, not elsewhere classified: Secondary | ICD-10-CM | POA: Diagnosis not present

## 2018-12-27 DIAGNOSIS — Z7982 Long term (current) use of aspirin: Secondary | ICD-10-CM | POA: Diagnosis not present

## 2018-12-27 DIAGNOSIS — E119 Type 2 diabetes mellitus without complications: Secondary | ICD-10-CM | POA: Diagnosis present

## 2018-12-27 DIAGNOSIS — Z8249 Family history of ischemic heart disease and other diseases of the circulatory system: Secondary | ICD-10-CM

## 2018-12-27 DIAGNOSIS — M4036 Flatback syndrome, lumbar region: Secondary | ICD-10-CM | POA: Diagnosis not present

## 2018-12-27 DIAGNOSIS — Z833 Family history of diabetes mellitus: Secondary | ICD-10-CM

## 2018-12-27 DIAGNOSIS — M961 Postlaminectomy syndrome, not elsewhere classified: Secondary | ICD-10-CM | POA: Diagnosis not present

## 2018-12-27 DIAGNOSIS — G4730 Sleep apnea, unspecified: Secondary | ICD-10-CM | POA: Diagnosis present

## 2018-12-27 DIAGNOSIS — E785 Hyperlipidemia, unspecified: Secondary | ICD-10-CM | POA: Diagnosis present

## 2018-12-27 DIAGNOSIS — R109 Unspecified abdominal pain: Secondary | ICD-10-CM | POA: Diagnosis not present

## 2018-12-27 DIAGNOSIS — M48061 Spinal stenosis, lumbar region without neurogenic claudication: Secondary | ICD-10-CM | POA: Diagnosis present

## 2018-12-27 DIAGNOSIS — M4326 Fusion of spine, lumbar region: Secondary | ICD-10-CM | POA: Diagnosis not present

## 2018-12-27 DIAGNOSIS — R1084 Generalized abdominal pain: Secondary | ICD-10-CM | POA: Diagnosis not present

## 2018-12-27 DIAGNOSIS — G8929 Other chronic pain: Secondary | ICD-10-CM | POA: Diagnosis present

## 2018-12-27 DIAGNOSIS — K429 Umbilical hernia without obstruction or gangrene: Secondary | ICD-10-CM | POA: Diagnosis not present

## 2018-12-27 DIAGNOSIS — K861 Other chronic pancreatitis: Secondary | ICD-10-CM | POA: Diagnosis not present

## 2018-12-27 DIAGNOSIS — K50911 Crohn's disease, unspecified, with rectal bleeding: Secondary | ICD-10-CM | POA: Diagnosis present

## 2018-12-27 DIAGNOSIS — Z981 Arthrodesis status: Secondary | ICD-10-CM

## 2018-12-27 DIAGNOSIS — N400 Enlarged prostate without lower urinary tract symptoms: Secondary | ICD-10-CM | POA: Diagnosis present

## 2018-12-27 DIAGNOSIS — Z79899 Other long term (current) drug therapy: Secondary | ICD-10-CM

## 2018-12-27 DIAGNOSIS — M79604 Pain in right leg: Secondary | ICD-10-CM | POA: Diagnosis not present

## 2018-12-27 DIAGNOSIS — Z87891 Personal history of nicotine dependence: Secondary | ICD-10-CM | POA: Diagnosis not present

## 2018-12-27 DIAGNOSIS — K5900 Constipation, unspecified: Secondary | ICD-10-CM | POA: Diagnosis present

## 2018-12-27 DIAGNOSIS — M5136 Other intervertebral disc degeneration, lumbar region: Secondary | ICD-10-CM | POA: Diagnosis present

## 2018-12-27 DIAGNOSIS — R140 Abdominal distension (gaseous): Secondary | ICD-10-CM | POA: Diagnosis not present

## 2018-12-27 HISTORY — PX: ANTERIOR LAT LUMBAR FUSION: SHX1168

## 2018-12-27 LAB — GLUCOSE, CAPILLARY
Glucose-Capillary: 147 mg/dL — ABNORMAL HIGH (ref 70–99)
Glucose-Capillary: 137 mg/dL — ABNORMAL HIGH (ref 70–99)
Glucose-Capillary: 149 mg/dL — ABNORMAL HIGH (ref 70–99)
Glucose-Capillary: 259 mg/dL — ABNORMAL HIGH (ref 70–99)

## 2018-12-27 LAB — HEMOGLOBIN A1C
Hgb A1c MFr Bld: 6.9 % — ABNORMAL HIGH (ref 4.8–5.6)
Mean Plasma Glucose: 151.33 mg/dL

## 2018-12-27 SURGERY — ANTERIOR LATERAL LUMBAR FUSION 2 LEVELS
Anesthesia: General | Site: Back

## 2018-12-27 MED ORDER — LEVOCETIRIZINE DIHYDROCHLORIDE 5 MG PO TABS: 5.0000 mg | ORAL_TABLET | Freq: Every day | ORAL | Status: DC | PRN

## 2018-12-27 MED ORDER — OXYCODONE HCL 5 MG PO TABS
5.0000 mg | ORAL_TABLET | ORAL | Status: DC | PRN
  Filled 2018-12-27 (×2): qty 1

## 2018-12-27 MED ORDER — SODIUM CHLORIDE 0.9 % IV SOLN: 250.0000 mL | INTRAVENOUS | Status: DC

## 2018-12-27 MED ORDER — THROMBIN (RECOMBINANT) 20000 UNITS EX SOLR
CUTANEOUS | Status: AC
  Filled 2018-12-27: qty 20000

## 2018-12-27 MED ORDER — PHENOL 1.4 % MT LIQD: 1.0000 | OROMUCOSAL | Status: DC | PRN

## 2018-12-27 MED ORDER — HYDROMORPHONE HCL 1 MG/ML IJ SOLN
INTRAMUSCULAR | Status: AC
  Filled 2018-12-27: qty 1

## 2018-12-27 MED ORDER — ONDANSETRON HCL 4 MG PO TABS: 4.0000 mg | ORAL_TABLET | Freq: Four times a day (QID) | ORAL | Status: DC | PRN

## 2018-12-27 MED ORDER — BUPIVACAINE-EPINEPHRINE 0.25% -1:200000 IJ SOLN
INTRAMUSCULAR | Status: DC | PRN
  Administered 2018-12-27: 20 mL

## 2018-12-27 MED ORDER — LACTATED RINGERS IV SOLN
INTRAVENOUS | Status: DC | PRN
  Administered 2018-12-27 (×2): via INTRAVENOUS

## 2018-12-27 MED ORDER — TAMSULOSIN HCL 0.4 MG PO CAPS
0.4000 mg | ORAL_CAPSULE | Freq: Every day | ORAL | Status: DC
  Administered 2018-12-28 – 2018-12-29 (×2): 0.4 mg via ORAL
  Filled 2018-12-27 (×2): qty 1

## 2018-12-27 MED ORDER — UMECLIDINIUM-VILANTEROL 62.5-25 MCG/INH IN AEPB
1.0000 | INHALATION_SPRAY | Freq: Every day | RESPIRATORY_TRACT | Status: DC | PRN
  Filled 2018-12-27: qty 14

## 2018-12-27 MED ORDER — HYDROMORPHONE HCL 1 MG/ML IJ SOLN
0.2500 mg | INTRAMUSCULAR | Status: DC | PRN
  Administered 2018-12-27 (×4): 0.5 mg via INTRAVENOUS

## 2018-12-27 MED ORDER — DULOXETINE HCL 30 MG PO CPEP
30.0000 mg | ORAL_CAPSULE | Freq: Two times a day (BID) | ORAL | Status: DC
  Administered 2018-12-27 – 2018-12-29 (×4): 30 mg via ORAL
  Filled 2018-12-27 (×4): qty 1

## 2018-12-27 MED ORDER — HYDROMORPHONE HCL 1 MG/ML IJ SOLN
INTRAMUSCULAR | Status: AC
  Administered 2018-12-27: 0.5 mg via INTRAVENOUS
  Filled 2018-12-27: qty 1

## 2018-12-27 MED ORDER — FENTANYL CITRATE (PF) 250 MCG/5ML IJ SOLN
INTRAMUSCULAR | Status: DC | PRN
  Administered 2018-12-27: 100 ug via INTRAVENOUS
  Administered 2018-12-27: 50 ug via INTRAVENOUS
  Administered 2018-12-27 (×2): 100 ug via INTRAVENOUS
  Administered 2018-12-27: 50 ug via INTRAVENOUS

## 2018-12-27 MED ORDER — HYDROMORPHONE HCL 1 MG/ML IJ SOLN
1.0000 mg | INTRAMUSCULAR | Status: AC | PRN
  Administered 2018-12-27 – 2018-12-28 (×2): 1 mg via INTRAVENOUS
  Filled 2018-12-27 (×2): qty 1

## 2018-12-27 MED ORDER — SODIUM CHLORIDE 0.9% FLUSH
3.0000 mL | Freq: Two times a day (BID) | INTRAVENOUS | Status: DC
  Administered 2018-12-27: 3 mL via INTRAVENOUS

## 2018-12-27 MED ORDER — PROMETHAZINE HCL 25 MG/ML IJ SOLN: 6.2500 mg | INTRAMUSCULAR | Status: DC | PRN

## 2018-12-27 MED ORDER — 0.9 % SODIUM CHLORIDE (POUR BTL) OPTIME
TOPICAL | Status: DC | PRN
  Administered 2018-12-27 (×4): 1000 mL

## 2018-12-27 MED ORDER — OXYCODONE HCL 5 MG PO TABS
ORAL_TABLET | ORAL | Status: AC
  Administered 2018-12-27: 10 mg via ORAL
  Filled 2018-12-27: qty 2

## 2018-12-27 MED ORDER — MIDAZOLAM HCL 2 MG/2ML IJ SOLN
INTRAMUSCULAR | Status: DC | PRN
  Administered 2018-12-27: 1 mg via INTRAVENOUS

## 2018-12-27 MED ORDER — METFORMIN HCL 500 MG PO TABS
1000.0000 mg | ORAL_TABLET | Freq: Two times a day (BID) | ORAL | Status: DC
  Administered 2018-12-27 – 2018-12-29 (×4): 1000 mg via ORAL
  Filled 2018-12-27 (×4): qty 2

## 2018-12-27 MED ORDER — CANAGLIFLOZIN 100 MG PO TABS
100.0000 mg | ORAL_TABLET | Freq: Every day | ORAL | Status: DC
  Administered 2018-12-28 – 2018-12-29 (×2): 100 mg via ORAL
  Filled 2018-12-27 (×3): qty 1

## 2018-12-27 MED ORDER — PROPOFOL 10 MG/ML IV BOLUS
INTRAVENOUS | Status: DC | PRN
  Administered 2018-12-27: 150 mg via INTRAVENOUS

## 2018-12-27 MED ORDER — MIDAZOLAM HCL 2 MG/2ML IJ SOLN
INTRAMUSCULAR | Status: AC
  Filled 2018-12-27: qty 2

## 2018-12-27 MED ORDER — FENTANYL CITRATE (PF) 250 MCG/5ML IJ SOLN
INTRAMUSCULAR | Status: AC
  Filled 2018-12-27: qty 5

## 2018-12-27 MED ORDER — OXYCODONE-ACETAMINOPHEN 10-325 MG PO TABS: 1.0000 | ORAL_TABLET | Freq: Four times a day (QID) | ORAL | 0 refills | Status: AC | PRN

## 2018-12-27 MED ORDER — SUCCINYLCHOLINE CHLORIDE 200 MG/10ML IV SOSY
PREFILLED_SYRINGE | INTRAVENOUS | Status: DC | PRN
  Administered 2018-12-27: 120 mg via INTRAVENOUS

## 2018-12-27 MED ORDER — INSULIN ASPART 100 UNIT/ML ~~LOC~~ SOLN
0.0000 [IU] | Freq: Three times a day (TID) | SUBCUTANEOUS | Status: DC
  Administered 2018-12-27: 2 [IU] via SUBCUTANEOUS
  Administered 2018-12-28 (×2): 3 [IU] via SUBCUTANEOUS
  Administered 2018-12-28: 8 [IU] via SUBCUTANEOUS

## 2018-12-27 MED ORDER — ONDANSETRON HCL 4 MG/2ML IJ SOLN
INTRAMUSCULAR | Status: DC | PRN
  Administered 2018-12-27: 4 mg via INTRAVENOUS

## 2018-12-27 MED ORDER — PROPOFOL 10 MG/ML IV BOLUS
INTRAVENOUS | Status: AC
  Filled 2018-12-27: qty 20

## 2018-12-27 MED ORDER — LACTATED RINGERS IV SOLN: INTRAVENOUS | Status: DC

## 2018-12-27 MED ORDER — MENTHOL 3 MG MT LOZG: 1.0000 | LOZENGE | OROMUCOSAL | Status: DC | PRN

## 2018-12-27 MED ORDER — CEFAZOLIN SODIUM-DEXTROSE 2-4 GM/100ML-% IV SOLN
2.0000 g | INTRAVENOUS | Status: AC
  Administered 2018-12-27: 2 g via INTRAVENOUS
  Filled 2018-12-27: qty 100

## 2018-12-27 MED ORDER — PROPOFOL 500 MG/50ML IV EMUL
INTRAVENOUS | Status: DC | PRN
  Administered 2018-12-27: 50 ug/kg/min via INTRAVENOUS

## 2018-12-27 MED ORDER — LORATADINE 10 MG PO TABS
10.0000 mg | ORAL_TABLET | Freq: Every day | ORAL | Status: DC
  Administered 2018-12-28 – 2018-12-29 (×2): 10 mg via ORAL
  Filled 2018-12-27 (×2): qty 1

## 2018-12-27 MED ORDER — ACETAMINOPHEN 650 MG RE SUPP: 650.0000 mg | RECTAL | Status: DC | PRN

## 2018-12-27 MED ORDER — FLUTICASONE PROPIONATE 50 MCG/ACT NA SUSP
1.0000 | Freq: Every day | NASAL | Status: DC
  Filled 2018-12-27: qty 16

## 2018-12-27 MED ORDER — METHOCARBAMOL 500 MG PO TABS
500.0000 mg | ORAL_TABLET | Freq: Four times a day (QID) | ORAL | Status: DC | PRN
  Administered 2018-12-27 – 2018-12-29 (×8): 500 mg via ORAL
  Filled 2018-12-27 (×7): qty 1

## 2018-12-27 MED ORDER — INSULIN GLARGINE 100 UNIT/ML ~~LOC~~ SOLN
60.0000 [IU] | Freq: Every day | SUBCUTANEOUS | Status: DC
  Administered 2018-12-27 – 2018-12-28 (×2): 60 [IU] via SUBCUTANEOUS
  Filled 2018-12-27 (×3): qty 0.6

## 2018-12-27 MED ORDER — METHOCARBAMOL 500 MG PO TABS: 500.0000 mg | ORAL_TABLET | Freq: Three times a day (TID) | ORAL | 0 refills | Status: AC | PRN

## 2018-12-27 MED ORDER — CEFAZOLIN SODIUM-DEXTROSE 1-4 GM/50ML-% IV SOLN
1.0000 g | Freq: Three times a day (TID) | INTRAVENOUS | Status: AC
  Administered 2018-12-27 – 2018-12-28 (×2): 1 g via INTRAVENOUS
  Filled 2018-12-27 (×2): qty 50

## 2018-12-27 MED ORDER — POLYETHYLENE GLYCOL 3350 17 G PO PACK: 17.0000 g | PACK | Freq: Every day | ORAL | Status: DC | PRN

## 2018-12-27 MED ORDER — OXYCODONE HCL 5 MG PO TABS
10.0000 mg | ORAL_TABLET | ORAL | Status: DC | PRN
  Administered 2018-12-27 – 2018-12-29 (×15): 10 mg via ORAL
  Filled 2018-12-27 (×13): qty 2

## 2018-12-27 MED ORDER — METHOCARBAMOL 500 MG PO TABS
ORAL_TABLET | ORAL | Status: AC
  Administered 2018-12-27: 500 mg via ORAL
  Filled 2018-12-27: qty 1

## 2018-12-27 MED ORDER — SODIUM CHLORIDE 0.9% FLUSH: 3.0000 mL | INTRAVENOUS | Status: DC | PRN

## 2018-12-27 MED ORDER — BUPIVACAINE-EPINEPHRINE (PF) 0.25% -1:200000 IJ SOLN
INTRAMUSCULAR | Status: AC
  Filled 2018-12-27: qty 30

## 2018-12-27 MED ORDER — OXYCODONE HCL 5 MG/5ML PO SOLN: 5.0000 mg | Freq: Once | ORAL | Status: DC | PRN

## 2018-12-27 MED ORDER — LIDOCAINE 2% (20 MG/ML) 5 ML SYRINGE
INTRAMUSCULAR | Status: DC | PRN
  Administered 2018-12-27: 100 mg via INTRAVENOUS

## 2018-12-27 MED ORDER — ONDANSETRON HCL 4 MG/2ML IJ SOLN
4.0000 mg | Freq: Four times a day (QID) | INTRAMUSCULAR | Status: DC | PRN
  Administered 2018-12-27: 4 mg via INTRAVENOUS

## 2018-12-27 MED ORDER — INSULIN ASPART 100 UNIT/ML ~~LOC~~ SOLN
0.0000 [IU] | Freq: Every day | SUBCUTANEOUS | Status: DC
  Administered 2018-12-27: 2 [IU] via SUBCUTANEOUS

## 2018-12-27 MED ORDER — MEPERIDINE HCL 50 MG/ML IJ SOLN: 6.2500 mg | INTRAMUSCULAR | Status: DC | PRN

## 2018-12-27 MED ORDER — ALBUTEROL SULFATE (2.5 MG/3ML) 0.083% IN NEBU: 3.0000 mL | INHALATION_SOLUTION | Freq: Four times a day (QID) | RESPIRATORY_TRACT | Status: DC | PRN

## 2018-12-27 MED ORDER — ONDANSETRON HCL 4 MG PO TABS: 4.0000 mg | ORAL_TABLET | Freq: Three times a day (TID) | ORAL | 0 refills | Status: AC | PRN

## 2018-12-27 MED ORDER — SODIUM CHLORIDE 0.9 % IV SOLN
INTRAVENOUS | Status: DC | PRN
  Administered 2018-12-27: 50 ug/min via INTRAVENOUS

## 2018-12-27 MED ORDER — LACTATED RINGERS IV SOLN
INTRAVENOUS | Status: DC | PRN
  Administered 2018-12-27: 09:00:00 via INTRAVENOUS

## 2018-12-27 MED ORDER — ACETAMINOPHEN 325 MG PO TABS
650.0000 mg | ORAL_TABLET | ORAL | Status: DC | PRN
  Administered 2018-12-27 – 2018-12-29 (×4): 650 mg via ORAL
  Filled 2018-12-27 (×4): qty 2

## 2018-12-27 MED ORDER — OXYCODONE HCL 5 MG PO TABS: 5.0000 mg | ORAL_TABLET | Freq: Once | ORAL | Status: DC | PRN

## 2018-12-27 MED ORDER — METHOCARBAMOL 1000 MG/10ML IJ SOLN
500.0000 mg | Freq: Four times a day (QID) | INTRAVENOUS | Status: DC | PRN
  Filled 2018-12-27: qty 5

## 2018-12-27 MED ORDER — HEMOSTATIC AGENTS (NO CHARGE) OPTIME
TOPICAL | Status: DC | PRN
  Administered 2018-12-27: 1

## 2018-12-27 MED ORDER — ONDANSETRON HCL 4 MG/2ML IJ SOLN
INTRAMUSCULAR | Status: AC
  Administered 2018-12-27: 4 mg via INTRAVENOUS
  Filled 2018-12-27: qty 2

## 2018-12-27 MED ORDER — HYDROXYZINE HCL 50 MG/ML IM SOLN
50.0000 mg | Freq: Four times a day (QID) | INTRAMUSCULAR | Status: DC | PRN
  Administered 2018-12-27: 50 mg via INTRAMUSCULAR
  Filled 2018-12-27: qty 1

## 2018-12-27 SURGICAL SUPPLY — 94 items
AGENT HMST KT MTR STRL THRMB (HEMOSTASIS) ×2
BLADE CLIPPER SURG (BLADE) IMPLANT
BLADE SURG 10 STRL SS (BLADE) ×4 IMPLANT
BONE MATRIX OSTEOCEL PRO MED (Bone Implant) ×2 IMPLANT
CABLE BIPOLOR RESECTION CORD (MISCELLANEOUS) ×4 IMPLANT
CAGE MODULUS XLW 10X22X60 - 10 (Cage) ×2 IMPLANT
CHLORAPREP W/TINT 26ML (MISCELLANEOUS) ×4 IMPLANT
CLOSURE STERI-STRIP 1/2X4 (GAUZE/BANDAGES/DRESSINGS) ×2
CLSR STERI-STRIP ANTIMIC 1/2X4 (GAUZE/BANDAGES/DRESSINGS) ×6 IMPLANT
COVER SURGICAL LIGHT HANDLE (MISCELLANEOUS) ×6 IMPLANT
COVER WAND RF STERILE (DRAPES) ×8 IMPLANT
DRAPE C-ARM 42X72 X-RAY (DRAPES) ×8 IMPLANT
DRAPE C-ARMOR (DRAPES) ×6 IMPLANT
DRAPE INCISE 23X17 IOBAN STRL (DRAPES) ×2
DRAPE INCISE 23X17 STRL (DRAPES) IMPLANT
DRAPE INCISE IOBAN 23X17 STRL (DRAPES) ×2 IMPLANT
DRAPE POUCH INSTRU U-SHP 10X18 (DRAPES) ×4 IMPLANT
DRAPE SURG 17X23 STRL (DRAPES) ×4 IMPLANT
DRAPE U-SHAPE 47X51 STRL (DRAPES) ×10 IMPLANT
DRSG OPSITE POSTOP 3X4 (GAUZE/BANDAGES/DRESSINGS) ×2 IMPLANT
DRSG OPSITE POSTOP 4X6 (GAUZE/BANDAGES/DRESSINGS) ×6 IMPLANT
DRSG OPSITE POSTOP 4X8 (GAUZE/BANDAGES/DRESSINGS) ×4 IMPLANT
ELECT BLADE 4.0 EZ CLEAN MEGAD (MISCELLANEOUS) ×4
ELECT BLADE 6.5 EXT (BLADE) ×4 IMPLANT
ELECT CAUTERY BLADE 6.4 (BLADE) ×4 IMPLANT
ELECT PENCIL ROCKER SW 15FT (MISCELLANEOUS) ×6 IMPLANT
ELECT REM PT RETURN 9FT ADLT (ELECTROSURGICAL) ×4
ELECTRODE BLDE 4.0 EZ CLN MEGD (MISCELLANEOUS) ×2 IMPLANT
ELECTRODE REM PT RTRN 9FT ADLT (ELECTROSURGICAL) ×4 IMPLANT
FLOSEAL 10ML (HEMOSTASIS) IMPLANT
GLOVE BIO SURGEON STRL SZ 6.5 (GLOVE) ×3 IMPLANT
GLOVE BIO SURGEONS STRL SZ 6.5 (GLOVE) ×1
GLOVE BIOGEL PI IND STRL 6.5 (GLOVE) ×2 IMPLANT
GLOVE BIOGEL PI IND STRL 8.5 (GLOVE) ×4 IMPLANT
GLOVE BIOGEL PI INDICATOR 6.5 (GLOVE) ×2
GLOVE BIOGEL PI INDICATOR 8.5 (GLOVE) ×4
GLOVE SS BIOGEL STRL SZ 8.5 (GLOVE) ×4 IMPLANT
GLOVE SUPERSENSE BIOGEL SZ 8.5 (GLOVE) ×6
GLOVE SURG SS PI 7.0 STRL IVOR (GLOVE) ×4 IMPLANT
GOWN STRL REUS W/ TWL LRG LVL3 (GOWN DISPOSABLE) ×4 IMPLANT
GOWN STRL REUS W/ TWL XL LVL3 (GOWN DISPOSABLE) IMPLANT
GOWN STRL REUS W/TWL 2XL LVL3 (GOWN DISPOSABLE) ×12 IMPLANT
GOWN STRL REUS W/TWL LRG LVL3 (GOWN DISPOSABLE) ×8
GOWN STRL REUS W/TWL XL LVL3 (GOWN DISPOSABLE)
GUIDEWIRE NITINOL BEVEL TIP (WIRE) ×12 IMPLANT
KIT BASIN OR (CUSTOM PROCEDURE TRAY) ×8 IMPLANT
KIT DILATOR XLIF 5 (KITS) ×1 IMPLANT
KIT POSITION SURG JACKSON T1 (MISCELLANEOUS) ×4 IMPLANT
KIT SURGICAL ACCESS MAXCESS 4 (KITS) ×2 IMPLANT
KIT TURNOVER KIT B (KITS) ×8 IMPLANT
KIT XLIF (KITS) ×1
MODULE EMG NDL SSEP NVM5 (NEEDLE) IMPLANT
MODULE EMG NEEDLE SSEP NVM5 (NEEDLE) ×4 IMPLANT
MODULE NVM5 NEXT GEN EMG (NEEDLE) ×2 IMPLANT
MODULUS XLW 12X22X55MM 10 (Spine Construct) ×2 IMPLANT
NDL I-PASS III (NEEDLE) IMPLANT
NDL SPNL 18GX3.5 QUINCKE PK (NEEDLE) ×4 IMPLANT
NEEDLE 22X1 1/2 (OR ONLY) (NEEDLE) ×8 IMPLANT
NEEDLE I-PASS III (NEEDLE) ×4 IMPLANT
NEEDLE SPNL 18GX3.5 QUINCKE PK (NEEDLE) ×4 IMPLANT
NS IRRIG 1000ML POUR BTL (IV SOLUTION) ×10 IMPLANT
PACK LAMINECTOMY ORTHO (CUSTOM PROCEDURE TRAY) ×6 IMPLANT
PACK UNIVERSAL I (CUSTOM PROCEDURE TRAY) ×6 IMPLANT
PAD ARMBOARD 7.5X6 YLW CONV (MISCELLANEOUS) ×16 IMPLANT
PATTIES SURGICAL .5 X.5 (GAUZE/BANDAGES/DRESSINGS) IMPLANT
PATTIES SURGICAL .5 X1 (DISPOSABLE) ×4 IMPLANT
POSITIONER HEAD PRONE TRACH (MISCELLANEOUS) ×4 IMPLANT
PUTTY BONE BIOACTIVE 5CC (Putty) ×4 IMPLANT
PUTTY DBX 1CC (Putty) ×4 IMPLANT
PUTTY DBX 1CC DEPUY (Putty) IMPLANT
ROD RELINE MAS LORD 5.5X85MM (Rod) ×4 IMPLANT
SCREW LOCK RELINE 5.5 TULIP (Screw) ×12 IMPLANT
SCREW RELINE RED 6.5X45MM POLY (Screw) ×12 IMPLANT
SPONGE LAP 4X18 RFD (DISPOSABLE) ×12 IMPLANT
SPONGE SURGIFOAM ABS GEL 100 (HEMOSTASIS) ×4 IMPLANT
STAPLER VISISTAT 35W (STAPLE) ×4 IMPLANT
SURGIFLO W/THROMBIN 8M KIT (HEMOSTASIS) ×2 IMPLANT
SUT BONE WAX W31G (SUTURE) ×4 IMPLANT
SUT MNCRL AB 3-0 PS2 18 (SUTURE) ×8 IMPLANT
SUT MON AB 3-0 SH 27 (SUTURE) ×4
SUT MON AB 3-0 SH27 (SUTURE) ×2 IMPLANT
SUT VIC AB 1 CT1 18XCR BRD 8 (SUTURE) ×6 IMPLANT
SUT VIC AB 1 CT1 8-18 (SUTURE) ×12
SUT VIC AB 1 CTX 36 (SUTURE)
SUT VIC AB 1 CTX36XBRD ANBCTR (SUTURE) IMPLANT
SUT VIC AB 2-0 CT1 18 (SUTURE) ×10 IMPLANT
SUT VICRYL 0 UR6 27IN ABS (SUTURE) ×2 IMPLANT
SYR BULB IRRIGATION 50ML (SYRINGE) ×8 IMPLANT
SYR CONTROL 10ML LL (SYRINGE) ×4 IMPLANT
TAPE CLOTH 4X10 WHT NS (GAUZE/BANDAGES/DRESSINGS) ×4 IMPLANT
TOWEL GREEN STERILE (TOWEL DISPOSABLE) ×8 IMPLANT
TOWEL GREEN STERILE FF (TOWEL DISPOSABLE) ×8 IMPLANT
TRAY FOLEY MTR SLVR 16FR STAT (SET/KITS/TRAYS/PACK) ×4 IMPLANT
YANKAUER SUCT BULB TIP NO VENT (SUCTIONS) ×4 IMPLANT

## 2018-12-27 NOTE — Evaluation (Signed)
Physical Therapy Evaluation Patient Details Name: Micheal Parker MRN: [id number] DOB: 1946-01-25 Today's Date: 12/27/2018   History of Present Illness  pt is a 73 y/o male with h/o dm2, sleep apnea, Crohn's, chronic pancreatitis admitted with worsening back, buttock and neuropathic right leg pain.  Pt s/p lateral interbody fusion at L 23 and L34 and PLA L2-4.  Clinical Impression  Pt admitted with/for elective lumbar fusion.  Pt is extreme pain and needing min to moderate assist for basic mobility.  Extensive education initiated, but will need to be reinforced.  Pt currently limited functionally due to the problems listed below.  (see problems list.)  Pt will benefit from PT to maximize function and safety to be able to get home safely with available assist.     Follow Up Recommendations Home health PT;Supervision/Assistance - 24 hour(No f/u if improve significantly by d/c)    Equipment Recommendations  None recommended by PT;Other (comment)(TBA)    Recommendations for Other Services       Precautions / Restrictions Precautions Precautions: Back Required Braces or Orthoses: Spinal Brace Spinal Brace: Lumbar corset;Applied in sitting position Restrictions Weight Bearing Restrictions: No      Mobility  Bed Mobility Overal bed mobility: Needs Assistance Bed Mobility: Rolling;Sidelying to Sit;Sit to Sidelying Rolling: Min assist Sidelying to sit: Min assist     Sit to sidelying: Mod assist General bed mobility comments: cued for technique, assisted trunk and legs  Transfers Overall transfer level: Needs assistance   Transfers: Sit to/from Stand Sit to Stand: Min assist;Mod assist         General transfer comment: cues for hand placement, assist to both come forward and boost  Ambulation/Gait Ambulation/Gait assistance: Min assist Gait Distance (Feet): 5 Feet(and 3 feet side stepping) Assistive device: Rolling walker (2 wheeled) Gait Pattern/deviations: Step-to  pattern     General Gait Details: painful short steps with hyperextension when pain hit.  Stairs            Wheelchair Mobility    Modified Rankin (Stroke Patients Only)       Balance Overall balance assessment: Needs assistance Sitting-balance support: Single extremity supported;No upper extremity supported Sitting balance-Leahy Scale: Fair     Standing balance support: Bilateral upper extremity supported Standing balance-Leahy Scale: Poor                               Pertinent Vitals/Pain Pain Assessment: 0-10 Pain Score: 10-Worst pain ever Pain Location: incision, back buttock Pain Descriptors / Indicators: Constant;Grimacing;Guarding;Sharp Pain Intervention(s): Limited activity within patient's tolerance;Premedicated before session;Monitored during session;Repositioned    Home Living Family/patient expects to be discharged to:: Private residence Living Arrangements: Spouse/significant other Available Help at Discharge: Family;Available 24 hours/day Type of Home: House Home Access: Stairs to enter Entrance Stairs-Rails: None Entrance Stairs-Number of Steps: 2 Home Layout: Able to live on main level with bedroom/bathroom Home Equipment: Walker - 2 wheels;Bedside commode;Shower seat      Prior Function Level of Independence: Independent               Hand Dominance        Extremity/Trunk Assessment   Upper Extremity Assessment Upper Extremity Assessment: Overall WFL for tasks assessed    Lower Extremity Assessment Lower Extremity Assessment: Generalized weakness(due to the pain)       Communication   Communication: No difficulties  Cognition Arousal/Alertness: Awake/alert Behavior During Therapy: WFL for tasks assessed/performed Overall Cognitive Status: Within  Functional Limits for tasks assessed                                        General Comments General comments (skin integrity, edema, etc.):  Instructed pt/wife in back care/prec, transitions side to/from sitting, lifting restrictions, donning the brace and progression of activity    Exercises     Assessment/Plan    PT Assessment Patient needs continued PT services  PT Problem List Decreased strength;Decreased activity tolerance;Decreased mobility;Decreased knowledge of use of DME;Decreased knowledge of precautions;Pain       PT Treatment Interventions DME instruction;Gait training;Stair training;Functional mobility training;Therapeutic activities;Patient/family education    PT Goals (Current goals can be found in the Care Plan section)  Acute Rehab PT Goals Patient Stated Goal: decrease pain PT Goal Formulation: With patient Time For Goal Achievement: 01/03/19 Potential to Achieve Goals: Good    Frequency Min 5X/week   Barriers to discharge        Co-evaluation               AM-PAC PT "6 Clicks" Mobility  Outcome Measure Help needed turning from your back to your side while in a flat bed without using bedrails?: A Little Help needed moving from lying on your back to sitting on the side of a flat bed without using bedrails?: A Lot Help needed moving to and from a bed to a chair (including a wheelchair)?: A Little Help needed standing up from a chair using your arms (e.g., wheelchair or bedside chair)?: A Little Help needed to walk in hospital room?: A Little Help needed climbing 3-5 steps with a railing? : A Lot 6 Click Score: 16    End of Session   Activity Tolerance: Patient limited by pain Patient left: in bed;with call bell/phone within reach;with family/visitor present Nurse Communication: Mobility status PT Visit Diagnosis: Other abnormalities of gait and mobility (R26.89);Pain Pain - part of body: (back)    Time: 7517-0017 PT Time Calculation (min) (ACUTE ONLY): 28 min   Charges:   PT Evaluation $PT Eval Moderate Complexity: 1 Mod PT Treatments $Self Care/Home Management: 8-22         12/27/2018  Donnella Sham, PT Acute Rehabilitation Services [phone]  (pager) [phone]  (office)  [name] 12/27/2018, 7:17 PM

## 2018-12-27 NOTE — Op Note (Signed)
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  Postoperative diagnosis: Same  Operative procedure: 1.  Lateral interbody fusion L 2/3 and L3/4.    2.  Posterior lumbar fusion and arthrodesis L2-4  Implants: NuVasive MIS pedicle screw fixation: 6.5 x 45 mm length screws x6.       Invasive titanium 3D cage L2-3: 12 x 22 x 55 lordotic.  L3-4: 10 x 22 x 60 lordotic.  Graft: L3-4 cage packed with Nano fuse.  L2-3 cage packed with osteocel and DBX.  Retraction time: L2-3 24 minutes.  L3-4 21 minutes.  Intraoperative neuro monitoring remained unremarkable with no abnormal SSEP activity.  All 6 pedicle screws were stimulated and there was no activity at greater than 40 mA.  First assistant: Ward, PA  Complications: None  Indications: This is a very pleasant 73 year old gentleman having progressive debilitating back buttock and neuropathic right leg pain.  He had a prior lumbar decompression and initially did well but he started having increasing back buttock and neuropathic right leg pain.  Imaging studies demonstrated recurrent spinal stenosis L2-4 with significant right lateral recess and foraminal stenosis.  Patient also had advanced degenerative disc disease with significant collapse contributing to the recurrent spinal stenosis.  As a result of the failure of conservative management we elected to move forward with surgery.  All appropriate risks benefits and alternatives were discussed with the patient and his wife and consent was obtained.  Operative report  Patient was brought the operating room placed by the operating table.  After successful induction general anesthesia and endotracheally patient teds SCDs and a Foley were inserted.  The neuro monitoring representative then inserted all of the appropriate leads and the patient was placed in the left lateral  decubitus position.  Axillary roll was placed and all bony prominences well-padded.  The patient was secured to the table with tape.  We confirmed satisfactory positioning of the L2-3 and the L3-4 disc space in both the AP and lateral planes prior to securing the patient in place.  The bed was then cracked in the center to improve the overall exposure through the left flank.  At this point please position the patient secured into the table the left abdomen and posterior lumbar spine were prepped and draped in a standard fashion.  A timeout was taken to confirm patient procedure and all other important data.  Once this was completed we proceeded with the surgery.  I marked out the midportion of the L3 vertebral body on the lateral surface of his body and then infiltrated the area with quarter percent Marcaine.  Patient was made and I sharply dissected down to expose the muscle fibers and fascia of the external oblique.  A second incision was made 1 fingerbreadth posteriorly.  This is a small incision allowed by finger to palpate to the posterior aspect of the retroperitoneum.  I bluntly dissected through the fascia and into the retroperitoneal space.  I then bluntly dissected through the fascia of the external oblique and then I could advance placed my first dilator.  The dilator was brought down to the surface of the psoas muscle and I stimulated the surface of the psoas to ensure that I was not traumatizing the plexus.  I then went through the substance of the psoas down to the lateral aspect of the disc space.  I made sure was in the anterior third  of the L3-4 disc space.  I then was able to circumferentially stimulated confirming I was not traumatizing the lumbar plexus.  I sequentially dilated until I was able to place my trocar.  Again with each dilator I did probe circumferentially confirming there was no adverse EMG activity.  Once the trocar was in place I then gently mobilized it posteriorly keeping the  thick cuff of muscle to protect the plexus.  Once my retractor was posterior enough I secured it to the disc space using the trocar.  I again stimulated with the neuro probe behind each of the blades confirming I was not traumatizing the lumbar plexus.  Once I had the lateral disc properly exposed I then proceeded with the discectomy.  An annulotomy was performed with a 10 blade scalpel and then I used a Cobb osteotome to release the disc and the contralateral annulus.  I then used a box osteotome to remove the bulk of the disc material.  Then using curettes I remove the remaining cartilaginous endplate saw had bleeding subchondral bone.  I then used sequential trials starting with the 8 orthotic.  I eventually settled on the 10 lordotic trial providing the best overall duration of disc height and indirect foraminal decompression.  I then obtained the appropriate size implant and packed it with the nano fuse allograft.  I then rasped the endplates to ensure had a bleeding surface and then malleted the implant into the L3-4 disc space.  It had excellent purchase and was well secured.  I then removed working trocar and made sure hemostasis using bipolar cautery and FloSeal.  With the L3-4 intervertebral cage complete I then repositioned my dilating tube at the L to 3 level.  Using the same sequence of events and technique I sequentially dilated and then placed the retractor onto the lateral aspect of the L2-3 disc space.  I again stimulated circumferentially and then gently manually move the retractor posteriorly until I was in the posterior third of the disc space it was secured into the disc space with a trocar and I again stimulated circumferentially confirming there was no trauma or compression to the lumbar plexus.  An annulotomy was again performed with a 10 blade scalpel at L2-3 and I used the same technique to perform a complete discectomy at this level.  A Cobb curette was used to release the  contralateral annulus and then I used a box osteotome and the curettes to remove all the disc material.  I then used the trial implants sequentially starting at the 8 lordotic trial cage proceeding up to the 12.  The 12 lordotic cage provided excellent overall distraction of the disc space and restoration of foraminal volume.  This implant was obtained and packed with the ostiocell and DBX.  I then irrigated the wound copiously normal saline and made sure had bleeding subchondral bone by using my rasp.  The titanium cage was then [facility] into the its final position.  At this point the retractor was removed and again hemostasis obtained using bipolar cautery and FloSeal after final irrigation I closed the fascia of the external oblique with interrupted 2-0 Vicryl suture.  At this point x-rays were taken which demonstrated satisfactory position in both planes of the 2 intervertebral cages.  There was excellent restoration of disc height and foraminal volume.  With the lateral interbody cages properly positioned I move forward with the posterior supplemental fixation.  With the patient maintained in the lateral decubitus position identified the lateral borders  of the right elbow to, L3, and L4 pedicles they were all marked out and a small incisions were made at each level.  Jamshidi needle was advanced percutaneously to the lateral aspect of the facet complex.  I then advanced the Jamshidi needle using the AP fluoroscopy view into the pedicle.  In addition to using fluoroscopy guidance I was also directly stimulating.  Once I near the medial wall of the pedicle I switched my view to the lateral to confirm that I was just beyond the posterior wall of the vertebral body.  I then advanced the Jamshidi needle into the L4 vertebral body.  I then placed the guidepin to cannulate the pedicle.  At this point the L4 pedicle was cannulated I repeated this exact same procedure at the L3 and L2 level and on the contralateral  side.  Once all 6 pedicles were cannulated I then measured and then began placing my screws  The screws were placed over the pin and then advanced using fluoroscopic guidance and direct neural stimulation.  There is no adverse EMG activity while inserting the screw.  All 6 screws had excellent purchase and were well secured.  Once this pedicle screws were properly positioned I then directly stimulated each of the 6 screws and there was no adverse activity at greater than 40 mA.  I then leveled the bed and then measured for the rod.  85 mm length rod was obtained and passed through the pedicle screws.  I confirmed that the rod was well-seated in all 3 pedicle screws and then the locking knots were placed.  The locking knots were then tightened and torqued off according manufacturer standards.  This was repeated on the contralateral side.  At this point final x-rays demonstrate satisfactory position of all 6 pedicle screws and both rods.  The insertion tabs were broken off and then I took my final x-rays.  The hardware was in good position and there were no complicating features in either plane.  At this point all of the wounds were copiously irrigated with normal saline.  The deep fascia was closed with a 0 Vicryl suture.  Superficial was closed with interrupted 2-0 Vicryl sutures and the skin was closed with 3-0 Monocryl stitch.  Steri-Strips and dry dressings were applied to all the wounds and the patient was ultimately extubated and transferred to PACU without incident.  The end of the case all needle sponge counts were correct.  There were no adverse intraoperative events.

## 2018-12-27 NOTE — Anesthesia Procedure Notes (Signed)
Procedure Name: Intubation Date/Time: 12/27/2018 8:58 AM Performed by: Elayne Snare, CRNA Pre-anesthesia Checklist: Patient identified, Emergency Drugs available, Suction available and Patient being monitored Patient Re-evaluated:Patient Re-evaluated prior to induction Oxygen Delivery Method: Circle System Utilized Preoxygenation: Pre-oxygenation with 100% oxygen Induction Type: IV induction Ventilation: Mask ventilation without difficulty Laryngoscope Size: Mac and 4 Grade View: Grade I Tube type: Oral Tube size: 7.5 mm Number of attempts: 1 Airway Equipment and Method: Stylet and Oral airway Placement Confirmation: positive ETCO2,  breath sounds checked- equal and bilateral and ETT inserted through vocal cords under direct vision Secured at: 23 cm Tube secured with: Tape Dental Injury: Teeth and Oropharynx as per pre-operative assessment

## 2018-12-27 NOTE — Transfer of Care (Signed)
Immediate Anesthesia Transfer of Care Note  Patient: Micheal Parker  Procedure(s) Performed: ANTERIOR LATERAL LUMBAR FUSION L2-4 (N/A Back)  Patient Location: PACU  Anesthesia Type:General  Level of Consciousness: awake and alert   Airway & Oxygen Therapy: Patient Spontanous Breathing and Patient connected to nasal cannula oxygen  Post-op Assessment: Report given to RN and Post -op Vital signs reviewed and stable  Post vital signs: Reviewed and stable  Last Vitals:  Vitals Value Taken Time  BP 153/77 12/27/2018  4:23 PM  Temp    Pulse 73 12/27/2018  4:24 PM  Resp 18 12/27/2018  4:24 PM  SpO2 100 % 12/27/2018  4:24 PM  Vitals shown include unvalidated device data.  Last Pain:  Vitals:   12/27/18 1535  TempSrc:   PainSc: 9       Patients Stated Pain Goal: 3 (16/10/96 0454)  Complications: No apparent anesthesia complications

## 2018-12-27 NOTE — Anesthesia Preprocedure Evaluation (Signed)
Anesthesia Evaluation  Patient identified by MRN, date of birth, ID band Patient awake    Reviewed: Allergy & Precautions, NPO status , Patient's Chart, lab work & pertinent test results  Airway Mallampati: II  TM Distance: >3 FB Neck ROM: Full    Dental   Pulmonary shortness of breath, sleep apnea , former smoker,    breath sounds clear to auscultation       Cardiovascular negative cardio ROS   Rhythm:Regular Rate:Normal     Neuro/Psych    GI/Hepatic negative GI ROS, Neg liver ROS,   Endo/Other  diabetes, Type 2  Renal/GU negative Renal ROS     Musculoskeletal  (+) Arthritis ,   Abdominal (+) + obese,   Peds  Hematology   Anesthesia Other Findings   Reproductive/Obstetrics                             Anesthesia Physical  Anesthesia Plan  ASA: III  Anesthesia Plan: General   Post-op Pain Management:    Induction: Intravenous  PONV Risk Score and Plan: 2 and Ondansetron and Midazolam  Airway Management Planned: Oral ETT  Additional Equipment:   Intra-op Plan:   Post-operative Plan:   Informed Consent: I have reviewed the patients History and Physical, chart, labs and discussed the procedure including the risks, benefits and alternatives for the proposed anesthesia with the patient or authorized representative who has indicated his/her understanding and acceptance.     Dental advisory given  Plan Discussed with: CRNA and Anesthesiologist  Anesthesia Plan Comments:         Anesthesia Quick Evaluation

## 2018-12-27 NOTE — H&P (Signed)
Micheal Parker presents today because of ongoing debilitating back buttock and neuropathic right leg pain. He states his quality-of-life is poor, and the pain is truly affecting his daily function. Despite injection therapy, previous surgery, physical therapy, and medications he continues to deteriorate. As a result of the failure of conservative measures the progressive leg pain and back pain he presents to me for surgical intervention. Surgical plan is to address the 2 level discogenic disease at L2-3 and L3-4 with a lateral interbody fusion with supplemental posterior fixation.  Allergies BETADINE  IODINATED CONTRAST MEDIA  NEOSPORIN AF   Medications Amitiza Anoro Ellipta 62.5 mcg-25 mcg/actuation powder for inhalation aspirin Cialis Creon 24,000-76,000-120,000 unit capsule,delayed release DULoxetine 30 mg capsule,delayed release Flomax Flonase gabapentin 600 mg tablet HumaLOG KwikPen (U-100) Insulin 100 unit/mL  HumaLOG U-100 Insulin 100 unit/mL subcutaneous solution Jardiance 25 mg tablet Lantus U-100 Insulin 100 unit/mL subcutaneous solution Lantuss-Forte tablet,extended release levocetirizine 5 mg tablet metFORMIN 1,000 mg tablet methocarbamoL 750 mg tablet Narcan 4 mg/actuation nasal spray ondansetron 4 mg disintegrating tablet oxyCODONE-acetaminophen 10 mg-325 mg tablet Super B Maxi Complex tamsulosin 0.4 mg capsule testosterone cypionate 200 mg/mL intramuscular oil Ventolin HFA 90 mcg/actuation aerosol inhaler Viagra Vitamin D Xyzal Zantac  Problems Post-laminectomy syndrome - Onset: 12/19/2018 Chronic neck pain - Onset: 01/30/2018 Lumbar post-laminectomy syndrome - Onset: 11/08/2018 Degeneration of lumbar intervertebral disc - Onset: 01/30/2018 Long-term drug therapy - Onset: 01/30/2018 Chronic low back pain - Onset: 01/30/2018 Pain of left hand - Onset: 09/25/2018 Pain in finger of right hand - Onset: 06/29/2018 Acquired trigger finger of left index finger - Onset:  10/23/2018 Acquired trigger finger of right index finger - Onset: 06/29/2018 Trigger finger - Onset: 09/25/2018 Long-term current use of opiate analgesic drug - Onset: 12/23/2017 Degeneration of thoracic intervertebral disc - Onset: 05/17/2018 Degeneration of cervical intervertebral disc - Onset: 02/14/2018  Surgical History Release of trigger finger - "several by Dr Amedeo Plenty" Tendodesis of long head of biceps muscle Cervical arthrodesis Lumbar Laminectomy - Dr Tonita Cong  Physical Exam Patient is a 73 year old male.  Clinical exam: Micheal Parker is a pleasant individual, who appears younger than their stated age. He Is alert and orientated 3. No shortness of breath, chest pain. Abdomen is soft and non-tender, negative loss of bowel and bladder control, no rebound tenderness. Negative: skin lesions abrasions contusions  Lungs: Clear to auscultation bilaterally.  Cardiac: No rubs gallops or murmurs. Regular rate and rhythm.  Peripheral pulses: 1+ dorsalis pedis/posterior tibialis pulses bilaterally. Compartment soft and nontender.  Gait pattern: Patient with right leg pain and dysesthesias causing an ataxic gait pattern  Assistive devices: Cane  Neuro: Dysesthesias and pain in the right groin and anterior thigh ( L2 and L3 dermatome). Negative nerve root tension signs in the lower extremity. Trace weakness of the hip flexor and quad on the right side is noted. Remainder of the motor exam is 5/5. 1+ deep tendon reflexes at the knee and Achilles bilaterally. Negative Babinski test, no clonus.  Musculoskeletal: Significant low back pain with palpation and range of motion. No SI joint pain. No significant hip, knee, ankle pain with isolated joint range of motion.  MRI of the right hip dated 10/28/18: Moderate tendinosis and ill-defined partial tearing along the right gluteus medius tendon insertion is noted. Moderate degenerative joint disease is noted. Diffuse right hip labral degeneration and  degenerative labral tearing also noted.  MRI of the lumbar spine dated 10/28/18: Moderate narrowing of the thecal sac at L2-3 due to severe disc bulge osteophyte complex  with foraminal narrowing bilaterally. Previous L3 and partial L4 laminectomies are noted. Severe disc bulging osteophyte complex at L3-4 as well as disc space loss at L3-4. Significant foraminal narrowing at L3-4. Moderate disc bulging but no canal stenosis at L4-5. L5-S1: Severe disc osteophyte complex but no significant central stenosis. There is impingement from foraminal disease at L5-S1.  X-rays of the lumbar spine taken today: No significant scoliosis. No spondylolisthesis. Significant collapse of the L2-3 and L3-4 disc spaces. Significant anterior traction spur formation. No fracture is seen.    Assessment / Plan Diagnosis: Micheal Parker is a very pleasant 73 year old gentleman with long-standing severe debilitating back pain. He has had previous lumbar surgery (decompression and discectomy) which is helped but over the several years his quality-of-life is deteriorated. He has poor mobility and now is using a cane. He states that despite injection therapy, and physical therapy, and change in activities his quality-of-life continues to deteriorate.  His clinical exam is significant for's right L2/L3 radicular leg pain and dysesthesias. He is also having trace weakness in the hip flexors and quads further compromising his ambulation. Based on his imaging studies, failure to improve with conservative management, and debilitating pain we have elected to move forward with surgery. At this point I am moving forward with a lateral L2-3 and L3-4 instrumented fusion with posterior supplemental fixation. This would provide indirect decompression especially restoring the foraminal volume. The primary goal of surgery is to reduce not eliminate his pain and thereby improve his quality-of-life. We have gone over the surgery in great detail as well as  the risks and benefits.  Risks, benefits of surgery were reviewed with the patient. These include: infection, bleeding, death, stroke, paralysis, ongoing or worse pain, need for additional surgery, injury to the lumbar plexus resulting in hip flexor weakness and difficulty walking without assistive devices. Adjacent segment degenerative disease, need for additional surgery including fusing other levels, leak of spinal fluid, Nonunion, hardware failure, breakage, or mal-position. Deep venous thrombosis (DVT) requiring additional treatment such as filter, and/or medications. Injury to abdominal contents, loss in bowel and bladder control.

## 2018-12-27 NOTE — Brief Op Note (Signed)
12/27/2018  1:37 PM  PATIENT:  Micheal Parker  73 y.o. 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

## 2018-12-28 ENCOUNTER — Encounter (HOSPITAL_COMMUNITY): Payer: Self-pay | Admitting: Orthopedic Surgery

## 2018-12-28 LAB — GLUCOSE, CAPILLARY
Glucose-Capillary: 252 mg/dL — ABNORMAL HIGH (ref 70–99)
Glucose-Capillary: 170 mg/dL — ABNORMAL HIGH (ref 70–99)
Glucose-Capillary: 195 mg/dL — ABNORMAL HIGH (ref 70–99)
Glucose-Capillary: 145 mg/dL — ABNORMAL HIGH (ref 70–99)

## 2018-12-28 NOTE — Progress Notes (Signed)
Occupational Therapy Evaluation Patient Details Name: Micheal Parker MRN: [id number] DOB: 11/10/1946 Today's Date: 12/28/2018    History of Present Illness pt is a 73 y/o male with h/o dm2, sleep apnea, Crohn's, chronic pancreatitis admitted with worsening back, buttock and neuropathic right leg pain.  Pt s/p lateral interbody fusion at L 23 and L34 and PLA L2-4.   Clinical Impression   PTA, pt independent with ADL and mobility although he was using a cane recently due to increased back pain. Pt complaining of R lateral thigh numbness. Began education on use of AE, DME and compensatory strategies to increase independence with ADL and functional mobility. Will follow acutely. Wife will be able to assist at DC. Do not anticipate need for HHOT.     Follow Up Recommendations  No OT follow up;Supervision - Intermittent    Equipment Recommendations  None recommended by OT    Recommendations for Other Services       Precautions / Restrictions Precautions Precautions: Back Precaution Booklet Issued: Yes (comment) Precaution Comments: R LE numbness Required Braces or Orthoses: Spinal Brace Spinal Brace: Lumbar corset;Applied in sitting position Restrictions Weight Bearing Restrictions: No      Mobility Bed Mobility               General bed mobility comments: sitting EOB  Transfers Overall transfer level: Needs assistance   Transfers: Sit to/from Stand Sit to Stand: Min assist         General transfer comment: cues for hand placement, assist to both come forward and boost    Balance Overall balance assessment: Needs assistance Sitting-balance support: Single extremity supported;No upper extremity supported Sitting balance-Leahy Scale: Fair     Standing balance support: Bilateral upper extremity supported Standing balance-Leahy Scale: Poor                             ADL either performed or assessed with clinical judgement   ADL Overall ADL's :  Needs assistance/impaired     Grooming: Set up;Supervision/safety;Sitting   Upper Body Bathing: Supervision/ safety;Set up;Sitting   Lower Body Bathing: Moderate assistance;Sit to/from stand   Upper Body Dressing : Set up;Supervision/safety;Sitting   Lower Body Dressing: Moderate assistance;Sit to/from stand   Toilet Transfer: Minimal assistance;Ambulation;RW   Toileting- Clothing Manipulation and Hygiene: Moderate assistance;Sitting/lateral lean Toileting - Clothing Manipulation Details (indicate cue type and reason): Pt has toilet aid but does not like it; educated pt on use of toilet tongs     Functional mobility during ADLs: Minimal assistance;Rolling walker General ADL Comments: Began education on back precautions and ADL     Vision         Perception     Praxis      Pertinent Vitals/Pain Pain Assessment: 0-10 Pain Score: 9  Pain Location: incision, back buttock Pain Descriptors / Indicators: Constant;Grimacing;Guarding;Sharp Pain Intervention(s): Limited activity within patient's tolerance     Hand Dominance Right   Extremity/Trunk Assessment Upper Extremity Assessment Upper Extremity Assessment: Overall WFL for tasks assessed   Lower Extremity Assessment Lower Extremity Assessment: Defer to PT evaluation(RLE numbness - lateral thigh)   Cervical / Trunk Assessment Cervical / Trunk Assessment: Other exceptions Cervical / Trunk Exceptions: fusion   Communication Communication Communication: No difficulties   Cognition Arousal/Alertness: Awake/alert Behavior During Therapy: WFL for tasks assessed/performed Overall Cognitive Status: Within Functional Limits for tasks assessed  General Comments       Exercises     Shoulder Instructions      Home Living Family/patient expects to be discharged to:: Private residence Living Arrangements: Spouse/significant other Available Help at Discharge:  Family;Available 24 hours/day Type of Home: House Home Access: Stairs to enter [employer] of Steps: 2 Entrance Stairs-Rails: None Home Layout: Able to live on main level with bedroom/bathroom     Bathroom Shower/Tub: Occupational psychologist: Standard Bathroom Accessibility: Yes How Accessible: Accessible via walker Home Equipment: [place] - 2 wheels;Bedside commode;Shower seat;Adaptive equipment Adaptive Equipment: Reacher;Sock aid;Long-handled shoe horn;Long-handled sponge;Other (Comment)(toilet aid )        Prior Functioning/Environment Level of Independence: Independent                 OT Problem List: Decreased strength;Decreased activity tolerance;Impaired balance (sitting and/or standing);Decreased safety awareness;Decreased knowledge of use of DME or AE;Decreased knowledge of precautions;Obesity;Pain      OT Treatment/Interventions: Self-care/ADL training;DME and/or AE instruction;Therapeutic activities;Patient/family education    OT Goals(Current goals can be found in the care plan section) Acute Rehab OT Goals Patient Stated Goal: decrease pain OT Goal Formulation: With patient Time For Goal Achievement: 01/11/19 Potential to Achieve Goals: Good  OT Frequency: Min 3X/week   Barriers to D/C:            Co-evaluation              AM-PAC OT "6 Clicks" Daily Activity     Outcome Measure Help from another person eating meals?: None Help from another person taking care of personal grooming?: A Little Help from another person toileting, which includes using toliet, bedpan, or urinal?: A Little Help from another person bathing (including washing, rinsing, drying)?: A Little Help from another person to put on and taking off regular upper body clothing?: A Little Help from another person to put on and taking off regular lower body clothing?: A Little 6 Click Score: 19   End of Session Equipment Utilized During Treatment: Gait  belt;Rolling walker;Back brace Nurse Communication: Mobility status;Other (comment)(DC needs; RLE numbness)  Activity Tolerance: Patient tolerated treatment well Patient left: in bed;with call bell/phone within reach;with family/visitor present  OT Visit Diagnosis: Unsteadiness on feet (R26.81);Muscle weakness (generalized) (M62.81);Pain Pain - part of body: (back)                Time: 1000-1020 OT Time Calculation (min): 20 min Charges:  OT General Charges $OT Visit: 1 Visit OT Evaluation $OT Eval Moderate Complexity: [place], OT/L   Acute OT Clinical Specialist Acute Rehabilitation Services Pager [phone] Office [phone]   [facility] 12/28/2018, 11:28 AM

## 2018-12-28 NOTE — Anesthesia Postprocedure Evaluation (Signed)
Anesthesia Post Note  Patient: Micheal Parker  Procedure(s) Performed: ANTERIOR LATERAL LUMBAR FUSION L2-4 (N/A Back)     Patient location during evaluation: PACU Anesthesia Type: General Level of consciousness: awake and alert Pain management: pain level controlled Vital Signs Assessment: post-procedure vital signs reviewed and stable Respiratory status: spontaneous breathing, nonlabored ventilation and respiratory function stable Cardiovascular status: blood pressure returned to baseline and stable Postop Assessment: no apparent nausea or vomiting Anesthetic complications: no    Last Vitals:  Vitals:   12/28/18 0440 12/28/18 0725  BP: (!) 153/64 (!) 145/71  Pulse: (!) 119 (!) 112  Resp: 20 18  Temp: 36.5 C 36.6 C  SpO2: 96% 93%    Last Pain:  Vitals:   12/28/18 0725  TempSrc: Oral  PainSc:                  Lynda Rainwater

## 2018-12-28 NOTE — Progress Notes (Signed)
Physical Therapy Treatment Patient Details Name: Micheal Parker MRN: [id number] DOB: [birth year] Today's Date: 12/28/2018    History of Present Illness Micheal Parker is a 73 y/o male with h/o dm2, sleep apnea, Crohn's, chronic pancreatitis admitted with worsening back, buttock and neuropathic right leg pain.  Micheal Parker s/p lateral interbody fusion at L 23 and L34 and PLA L2-4.    Micheal Parker Comments    Micheal Parker continues to demonstrate motor control deficits with OOB mobility due to R knee numbness and consistent R knee buckling with weight bearing. Feel a knee immobilizer may be beneficial to decrease risk for falls at this time. Therapist adjusted brace for optimal fit, however Micheal Parker may get a better overall fit with a smaller brace. Currently has a L-XL and may need a M-L. Micheal Parker limited with how tight the brace can be due to abdominal hernia that is painful with brace donned. Will continue to follow and progress as able per POC.   Follow Up Recommendations  Home health Micheal Parker;Supervision/Assistance - 24 hour     Equipment Recommendations  None recommended by Micheal Parker    Recommendations for Other Services       Precautions / Restrictions Precautions Precautions: Back Precaution Booklet Issued: Yes (comment) Precaution Comments: R LE numbness, R knee buckling Required Braces or Orthoses: Spinal Brace Spinal Brace: Lumbar corset;Applied in sitting position Restrictions Weight Bearing Restrictions: No    Mobility  Bed Mobility               General bed mobility comments: sitting EOB upon Micheal Parker arrival.   Transfers Overall transfer level: Needs assistance Equipment used: Rolling walker (2 wheeled) Transfers: Sit to/from Stand Sit to Stand: Min guard;Min assist         General transfer comment: cues for hand placement, assist to both come forward and boost  Ambulation/Gait Ambulation/Gait assistance: Min assist Gait Distance (Feet): 30 Feet Assistive device: Rolling walker (2 wheeled) Gait Pattern/deviations:  Step-to pattern Gait velocity: Decreased Gait velocity interpretation: <1.8 ft/sec, indicate of risk for recurrent falls General Gait Details: Slow and guarded. Micheal Parker cued for increased quad activation/knee extension before attempting to bear weight through RLE however Micheal Parker reports increased pain in R knee cap. Micheal Parker with frequent knee buckles throughout gait training and requires hands-on assist throughout for safety.    Stairs             Wheelchair Mobility    Modified Rankin (Stroke Patients Only)       Balance Overall balance assessment: Needs assistance Sitting-balance support: Single extremity supported;No upper extremity supported Sitting balance-Leahy Scale: Fair     Standing balance support: Bilateral upper extremity supported Standing balance-Leahy Scale: Poor Standing balance comment: Reliant on UE support                            Cognition Arousal/Alertness: Awake/alert Behavior During Therapy: WFL for tasks assessed/performed Overall Cognitive Status: Within Functional Limits for tasks assessed                                        Exercises      General Comments        Pertinent Vitals/Pain Pain Assessment: Faces Pain Score: 9  Faces Pain Scale: Hurts little more Pain Location: incision, back buttock, abdominal hernia with brace use Pain Descriptors / Indicators: Constant;Grimacing;Guarding;Sharp Pain Intervention(s): Monitored during session  Home Living Family/patient expects to be discharged to:: Private residence Living Arrangements: Spouse/significant other Available Help at Discharge: Family;Available 24 hours/day Type of Home: House Home Access: Stairs to enter Entrance Stairs-Rails: None Home Layout: Able to live on main level with bedroom/bathroom Home Equipment: Walker - 2 wheels;Bedside commode;Shower seat;Adaptive equipment      Prior Function Level of Independence: Independent          Micheal Parker Goals  (current goals can now be found in the care plan section) Acute Rehab Micheal Parker Goals Patient Stated Goal: decrease pain Micheal Parker Goal Formulation: With patient Time For Goal Achievement: 01/03/19 Potential to Achieve Goals: Good Progress towards Micheal Parker goals: Progressing toward goals    Frequency    Min 5X/week      Micheal Parker Plan Current plan remains appropriate    Co-evaluation              AM-PAC Micheal Parker "6 Clicks" Mobility   Outcome Measure  Help needed turning from your back to your side while in a flat bed without using bedrails?: A Little Help needed moving from lying on your back to sitting on the side of a flat bed without using bedrails?: A Lot Help needed moving to and from a bed to a chair (including a wheelchair)?: A Little Help needed standing up from a chair using your arms (e.g., wheelchair or bedside chair)?: A Little Help needed to walk in hospital room?: A Little Help needed climbing 3-5 steps with a railing? : A Lot 6 Click Score: 16    End of Session Equipment Utilized During Treatment: Gait belt Activity Tolerance: Patient limited by pain Patient left: with call bell/phone within reach;with family/visitor present(Sitting EOB) Nurse Communication: Mobility status Micheal Parker Visit Diagnosis: Other abnormalities of gait and mobility (R26.89);Pain Pain - part of body: (back)     Time: 3545-6256 Micheal Parker Time Calculation (min) (ACUTE ONLY): 25 min  Charges:  $Gait Training: 23-37 mins                     Micheal Parker, Micheal Parker, DPT Acute Rehabilitation Services Pager: [phone] Office: [phone]    Thelma Comp 12/28/2018, 11:52 AM

## 2018-12-28 NOTE — Progress Notes (Signed)
Inpatient Diabetes Program Recommendations  AACE/ADA: New Consensus Statement on Inpatient Glycemic Control (2015)  Target Ranges:  Prepandial:   less than 140 mg/dL      Peak postprandial:   less than 180 mg/dL (1-2 hours)      Critically ill patients:  140 - 180 mg/dL   Lab Results  Component Value Date   GLUCAP 252 (H) 12/28/2018   HGBA1C 6.9 (H) 12/27/2018    Review of Glycemic Control Results for Micheal Parker, Micheal Parker (MRN [id number]) as of 12/28/2018 09:58  Ref. Range 12/27/2018 13:54 12/27/2018 17:10 12/27/2018 21:26 12/28/2018 06:31  Glucose-Capillary Latest Ref Range: 70 - 99 mg/dL 137 (H) 149 (H) 259 (H) 252 (H)   Diabetes history: DM 2 Outpatient Diabetes medications:  Jardiance 25 mg daily, Lantus 60 units q HS, Humalog 4-16 units tid with meals  Metformin 1000 mg bid Current orders for Inpatient glycemic control:  Invokana 100 mg daily, Lantus 60 units q HS, Novolog moderate tid with meals and HS, Metformin 1000 mg bid Inpatient Diabetes Program Recommendations:   Please consider adding Novolog meal coverage 4 units tid with meals (hold if patient eats less than 50%).   Thanks  Adah Perl, RN, BC-ADM Inpatient Diabetes Coordinator Pager [phone] (8a-5p)

## 2018-12-28 NOTE — Progress Notes (Signed)
Subjective: 1 Day Post-Op Procedure(s) (LRB): ANTERIOR LATERAL LUMBAR FUSION L2-4 (N/A) Patient reports pain as 8 on 0-10 scale. Overall well managed.  Pt reports some nausea improved with nausea medication. Overall tolerating PO. + flatus, + voiding. Reports some right thigh "numbness" and "giving way" of leg while working with PT. Denies SOB, HA, fevers/sweats/chills, calf pain.   Objective: Vital signs in last 24 hours: Temp:  [97.2 F (36.2 C)-98.7 F (37.1 C)] 97.8 F (36.6 C) (01/30 0725) Pulse Rate:  [66-119] 112 (01/30 0725) Resp:  [13-22] 18 (01/30 0725) BP: (117-172)/(48-88) 145/71 (01/30 0725) SpO2:  [93 %-98 %] 93 % (01/30 0725)  Intake/Output from previous day: 01/29 0701 - 01/30 0700 In: 2150 [I.V.:2150] Out: 1883 [Urine:1583; Blood:300] Intake/Output this shift: No intake/output data recorded.  No results for input(s): HGB in the last 72 hours. No results for input(s): WBC, RBC, HCT, PLT in the last 72 hours. No results for input(s): NA, K, CL, CO2, BUN, CREATININE, GLUCOSE, CALCIUM in the last 72 hours. No results for input(s): LABPT, INR in the last 72 hours.  Neurologically intact ABD soft Neurovascular intact Sensation intact distally Intact pulses distally Dorsiflexion/Plantar flexion intact Incision: dressing C/D/I No cellulitis present Compartment soft No palpable cords, calf tenderness, _ Homan's   Assessment/Plan:  1 Day Post-Op Procedure(s) (LRB): ANTERIOR LATERAL LUMBAR FUSION L2-4 (N/A) Up with therapy Plan for discharge tomorrow  Will monitor right L3 dysathesias with ambulation for the rest of today and into tomorrow.  Continue to monitor neuro exam.  Continue to manage pain with current regimen.  Nausea medication PRN Encourage ICS, ted's, ambulation. Patient will be fitted for a new LSO brace in the office upon discharge.     Yvonne Kendall Ward 12/28/2018, 11:54 AM

## 2018-12-29 ENCOUNTER — Inpatient Hospital Stay (HOSPITAL_COMMUNITY): Payer: Medicare Other

## 2018-12-29 LAB — GLUCOSE, CAPILLARY
Glucose-Capillary: 117 mg/dL — ABNORMAL HIGH (ref 70–99)
Glucose-Capillary: 135 mg/dL — ABNORMAL HIGH (ref 70–99)

## 2018-12-29 MED ORDER — DOCUSATE SODIUM 100 MG PO CAPS
100.0000 mg | ORAL_CAPSULE | Freq: Two times a day (BID) | ORAL | Status: DC
  Administered 2018-12-29: 100 mg via ORAL

## 2018-12-29 MED ORDER — FLEET ENEMA 7-19 GM/118ML RE ENEM
1.0000 | ENEMA | Freq: Once | RECTAL | Status: AC
  Administered 2018-12-29: 1 via RECTAL
  Filled 2018-12-29: qty 1

## 2018-12-29 MED ORDER — BISACODYL 10 MG RE SUPP
10.0000 mg | Freq: Once | RECTAL | Status: AC
  Administered 2018-12-29: 10 mg via RECTAL
  Filled 2018-12-29: qty 1

## 2018-12-29 MED ORDER — MAGNESIUM CITRATE PO SOLN
1.0000 | Freq: Once | ORAL | Status: AC
  Administered 2018-12-29: 1 via ORAL
  Filled 2018-12-29: qty 296

## 2018-12-29 MED ORDER — POLYETHYLENE GLYCOL 3350 17 G PO PACK: 17.0000 g | PACK | Freq: Every day | ORAL | Status: DC

## 2018-12-29 NOTE — Progress Notes (Signed)
Physical Therapy Treatment Patient Details Name: Micheal Parker MRN: [id number] DOB: 28-May-1946 Today's Date: 12/29/2018    History of Present Illness pt is a 73 y/o male with h/o dm2, sleep apnea, Crohn's, chronic pancreatitis admitted with worsening back, buttock and neuropathic right leg pain.  Pt s/p lateral interbody fusion at L 23 and L34 and PLA L2-4.    PT Comments    Pt progressing slowly towards physical therapy goals. Continues to report numbness and weakness in RLE. Noted several instances of knee buckling and overall decreased gross motor control during swing through of gait cycle, however also appears improved from yesterday. Pt's main complaint today is pain from abdominal hernia. PT adjusted brace to accommodate yellow foam over hernia for pain control. Per RN, MD not in favor of knee immobilizer. Will continue to follow and progress as able per POC.   Follow Up Recommendations  Home health PT;Supervision/Assistance - 24 hour     Equipment Recommendations  None recommended by PT    Recommendations for Other Services       Precautions / Restrictions Precautions Precautions: Back Precaution Booklet Issued: Yes (comment) Precaution Comments: R LE numbness, R knee buckling Required Braces or Orthoses: Spinal Brace Spinal Brace: Lumbar corset;Applied in sitting position Restrictions Weight Bearing Restrictions: No    Mobility  Bed Mobility               General bed mobility comments: sitting EOB upon PT arrival. reports he cannot get comfortable laying down.   Transfers Overall transfer level: Needs assistance Equipment used: Rolling walker (2 wheeled) Transfers: Sit to/from Stand Sit to Stand: Min guard         General transfer comment: cues for hand placement on seated surface for safety.   Ambulation/Gait Ambulation/Gait assistance: Min guard Gait Distance (Feet): 60 Feet Assistive device: Rolling walker (2 wheeled) Gait Pattern/deviations:  Step-to pattern Gait velocity: Decreased Gait velocity interpretation: <1.8 ft/sec, indicate of risk for recurrent falls General Gait Details: Slow and guarded. Pt cued for increased quad activation/knee extension before attempting to bear weight through RLE however pt reports increased pain in R knee cap. Pt with frequent knee buckles throughout gait training and requires hands-on assist throughout for safety.    Stairs         General stair comments: Pt declines stair training, stating he only has 2 small steps to enter his home and he feels comfortable. Verbally reviewed stair negotiation with weak RLE.    Wheelchair Mobility    Modified Rankin (Stroke Patients Only)       Balance Overall balance assessment: Needs assistance Sitting-balance support: Single extremity supported;No upper extremity supported Sitting balance-Leahy Scale: Fair     Standing balance support: Bilateral upper extremity supported Standing balance-Leahy Scale: Poor Standing balance comment: Reliant on UE support                            Cognition Arousal/Alertness: Awake/alert Behavior During Therapy: WFL for tasks assessed/performed Overall Cognitive Status: Within Functional Limits for tasks assessed                                        Exercises      General Comments        Pertinent Vitals/Pain Pain Assessment: Faces Faces Pain Scale: Hurts even more Pain Location: incision, back buttock, abdominal hernia with  brace use Pain Descriptors / Indicators: Constant;Grimacing;Guarding;Sharp Pain Intervention(s): Monitored during session    Home Living                      Prior Function            PT Goals (current goals can now be found in the care plan section) Acute Rehab PT Goals Patient Stated Goal: decrease pain, get feeling back in R knee PT Goal Formulation: With patient Time For Goal Achievement: 01/03/19 Potential to Achieve Goals:  Good Progress towards PT goals: Progressing toward goals    Frequency    Min 5X/week      PT Plan Current plan remains appropriate    Co-evaluation              AM-PAC PT "6 Clicks" Mobility   Outcome Measure  Help needed turning from your back to your side while in a flat bed without using bedrails?: A Little Help needed moving from lying on your back to sitting on the side of a flat bed without using bedrails?: A Lot Help needed moving to and from a bed to a chair (including a wheelchair)?: A Little Help needed standing up from a chair using your arms (e.g., wheelchair or bedside chair)?: A Little Help needed to walk in hospital room?: A Little Help needed climbing 3-5 steps with a railing? : A Lot 6 Click Score: 16    End of Session Equipment Utilized During Treatment: Gait belt Activity Tolerance: Patient limited by pain Patient left: with call bell/phone within reach;with family/visitor present(Sitting EOB) Nurse Communication: Mobility status PT Visit Diagnosis: Other abnormalities of gait and mobility (R26.89);Pain Pain - part of body: (back)     Time: 9407-6808 PT Time Calculation (min) (ACUTE ONLY): 17 min  Charges:  $Gait Training: 8-22 mins                     [name], PT, DPT Acute Rehabilitation Services Pager: [phone] Office: [phone]    Thelma Comp 12/29/2018, 8:09 AM

## 2018-12-29 NOTE — Progress Notes (Signed)
Occupational Therapy Treatment Patient Details Name: Micheal Parker MRN: [id number] DOB: 09/29/1946 Today's Date: 12/29/2018    History of present illness pt is a 73 y/o male with h/o dm2, sleep apnea, Crohn's, chronic pancreatitis admitted with worsening back, buttock and neuropathic right leg pain.  Pt s/p lateral interbody fusion at L 23 and L34 and PLA L2-4.   OT comments  Pt. Seen for skilled OT session with wife present.  Reviewed use of A/E with focus on toilet tongs.  Reviewed back precautions also and answered questions regarding bed mobility techniques.  Pt. And spouse report they have all necessary DME and wife will be assisting with LB ADLs as needed.  Both eager for d/c home when able.   Follow Up Recommendations  No OT follow up;Supervision - Intermittent    Equipment Recommendations  None recommended by OT    Recommendations for Other Services      Precautions / Restrictions Precautions Precautions: Back Precaution Booklet Issued: Yes (comment) Precaution Comments: R LE numbness, R knee buckling Required Braces or Orthoses: Spinal Brace Spinal Brace: Lumbar corset;Applied in sitting position Restrictions Weight Bearing Restrictions: No       Mobility Bed Mobility               General bed mobility comments: sitting EOB upon PT arrival. reports he cannot get comfortable laying down.   Transfers Overall transfer level: Needs assistance Equipment used: Rolling walker (2 wheeled) Transfers: Sit to/from Stand Sit to Stand: Min guard         General transfer comment: cues for hand placement on seated surface for safety.     Balance Overall balance assessment: Needs assistance Sitting-balance support: Single extremity supported;No upper extremity supported Sitting balance-Leahy Scale: Fair     Standing balance support: Bilateral upper extremity supported Standing balance-Leahy Scale: Poor Standing balance comment: Reliant on UE support                           ADL either performed or assessed with clinical judgement   ADL Overall ADL's : Needs assistance/impaired               Lower Body Bathing Details (indicate cue type and reason): wife present reports they have all A/E and she has also purchased toilet tongs, in addition to these items she will be assisting as needed with LB ADLs       Lower Body Dressing Details (indicate cue type and reason): wife present reports they have all A/E and she has also purchased toilet tongs, in addition to these items she will be assisting as needed with LB ADLs   Toilet Transfer Details (indicate cue type and reason): declined need for transfer review   Toileting - Clothing Manipulation Details (indicate cue type and reason): wife purchased toilet tongs, reviewed demo with pt. on ways to wrap and place t.paper on the tongs   Tub/Shower Transfer Details (indicate cue type and reason): wife states they have all nec. dme and grab bars for the tub         Vision       Perception     Praxis      Cognition Arousal/Alertness: Awake/alert Behavior During Therapy: WFL for tasks assessed/performed Overall Cognitive Status: Within Functional Limits for tasks assessed  Exercises     Shoulder Instructions       General Comments      Pertinent Vitals/ Pain       Pain Assessment: Faces Faces Pain Scale: Hurts even more Pain Location: abdominal hernia Pain Descriptors / Indicators: Constant;Grimacing;Guarding;Sharp Pain Intervention(s): Monitored during session  Home Living                                          Prior Functioning/Environment              Frequency  Min 3X/week        Progress Toward Goals  OT Goals(current goals can now be found in the care plan section)  Progress towards OT goals: Progressing toward goals  Acute Rehab OT Goals Patient Stated Goal: decrease  pain, get feeling back in R knee  Plan      Co-evaluation                 AM-PAC OT "6 Clicks" Daily Activity     Outcome Measure   Help from another person eating meals?: None Help from another person taking care of personal grooming?: A Little Help from another person toileting, which includes using toliet, bedpan, or urinal?: A Little Help from another person bathing (including washing, rinsing, drying)?: A Little Help from another person to put on and taking off regular upper body clothing?: A Little Help from another person to put on and taking off regular lower body clothing?: A Little 6 Click Score: 19    End of Session Equipment Utilized During Treatment: Other (comment)(A/E-toilet tongs)  OT Visit Diagnosis: Unsteadiness on feet (R26.81);Muscle weakness (generalized) (M62.81);Pain   Activity Tolerance Patient tolerated treatment well   Patient Left in bed;with call bell/phone within reach;with family/visitor present   Nurse Communication          Time: 7681-1572 OT Time Calculation (min): 10 min  Charges: OT General Charges $OT Visit: 1 Visit OT Treatments $Self Care/Home Management : 8-22 mins   Janice Coffin, COTA/L 12/29/2018, 10:49 AM

## 2018-12-29 NOTE — Consult Note (Signed)
[facility] Consult Note  Jamaine Mcgurn 11-28-1946  [id number].    Requesting MD: Rolena Infante Chief Complaint/Reason for Consult: hernia  HPI:  Patient is a 73 year old male with multiple medical issues who is currently admitted to the hospital after lumbar fusion 2 days ago. He complained of increased pain at umbilical hernia site today and increased abdominal distention. Patient reports he has some chronic nausea and nausea currently feels no different from that. Patient does report that he has been passing flatus. Keeping fluids down. Had some sort of hernia repair as a child at umbilicus around 47 years old.   ROS: Review of Systems  Respiratory: Positive for shortness of breath (secondary to distention ).   Gastrointestinal: Positive for abdominal pain and nausea. Negative for constipation, diarrhea and vomiting.  All other systems reviewed and are negative.   Family History  Problem Relation Age of Onset  . Heart disease Father        in his late 77's  . Pulmonary embolism Father   . Heart attack Father   . Hypertension Other   . Hyperlipidemia Other   . Diabetes type II Other   . Neuropathy Other   . Pulmonary embolism Brother   . Heart attack Brother        early 63's    Past Medical History:  Diagnosis Date  . Arthritis   . BPH (benign prostatic hypertrophy)   . Chronic back pain   . Chronic neck pain   . Chronic pancreatitis ([place])   . Confusion   . Crohn's disease ([place])    "in remission"  . Hyperlipidemia   . Numbness and tingling 06/23/2016   right arm and left leg  . Pericarditis    "not a problem now"  . Shortness of breath    "no problems now"  . Sleep apnea    cpap  . Type II diabetes mellitus ([facility])    Type 2  . Wears glasses     Past Surgical History:  Procedure Laterality Date  . ANTERIOR CERVICAL DECOMP/DISCECTOMY FUSION N/A 07/01/2016   Procedure: ACDF C6-7;  Surgeon: Melina Schools, MD;  Location: [facility];  Service: Orthopedics;   Laterality: N/A;  . ANTERIOR LAT LUMBAR FUSION N/A 12/27/2018   Procedure: ANTERIOR LATERAL LUMBAR FUSION L2-4;  Surgeon: Melina Schools, MD;  Location: [facility];  Service: Orthopedics;  Laterality: N/A;  6 hrs for both procedures  . APPENDECTOMY    . BACK SURGERY    . CARDIAC CATHETERIZATION  2015   [facility]  . CATARACT EXTRACTION W/PHACO Right 12/07/2016   Procedure: CATARACT EXTRACTION PHACO AND INTRAOCULAR LENS PLACEMENT (IOC);  Surgeon: Rutherford Guys, MD;  Location: [facility];  Service: Ophthalmology;  Laterality: Right;  CDE: 6.67  . CATARACT EXTRACTION W/PHACO Left 12/21/2016   Procedure: CATARACT EXTRACTION PHACO AND INTRAOCULAR LENS PLACEMENT (IOC);  Surgeon: Rutherford Guys, MD;  Location: [facility];  Service: Ophthalmology;  Laterality: Left;  CDE: 5.72  . COLONOSCOPY    . CYSTECTOMY Bilateral    eyes  . EYE SURGERY    . ROTATOR CUFF REPAIR Right    and bicep   . TONSILLECTOMY    . UMBILICAL HERNIA REPAIR      Social History:  reports that he quit smoking about 10 years ago. He has a 80.00 pack-year smoking history. He has never used smokeless tobacco. He reports that he does not drink alcohol or use drugs.  Allergies:  Allergies  Allergen Reactions  . Betadine [  Povidone Iodine] Anaphylaxis and Swelling  . Iodine Anaphylaxis and Swelling    SEE CONTRAST AGENTS  . Iohexol Anaphylaxis, Shortness Of Breath and Other (See Comments)     Code: SOB, Desc: pt states 10 yrs ago he had IV contrast @ [facility] and experienced severe sob., Onset Date: [id number]   . Etodolac Other (See Comments)    unknown  . Nsaids Other (See Comments)    REACTION: Chron's DZ; Caused rectal bleeding  . Tape     Other reaction(s): Other (See Comments)    Medications Prior to Admission  Medication Sig Dispense Refill  . albuterol (PROVENTIL HFA;VENTOLIN HFA) 108 (90 Base) MCG/ACT inhaler Inhale 1 puff into the lungs every 6 (six) hours as needed for wheezing or shortness of breath.    Marland Kitchen aspirin 81 MG tablet Take 81  mg by mouth at bedtime.     . B Complex-C (SUPER B COMPLEX PO) Take 1 tablet by mouth daily.    . Cholecalciferol (VITAMIN D3) 25 MCG (1000 UT) CAPS Take 1,000 Units by mouth daily.    . DULoxetine (CYMBALTA) 30 MG capsule Take 30 mg by mouth 2 (two) times daily.    . empagliflozin (JARDIANCE) 25 MG TABS tablet Take 25 mg by mouth daily.    . fluticasone (FLONASE) 50 MCG/ACT nasal spray Place 1 spray into both nostrils daily.     Marland Kitchen gabapentin (NEURONTIN) 600 MG tablet Take 0.5 tablets (300 mg total) by mouth 3 (three) times daily. (Patient taking differently: Take 600 mg by mouth 4 (four) times daily. ) 30 tablet 0  . insulin glargine (LANTUS) 100 UNIT/ML injection Inject 60 Units into the skin at bedtime.     . insulin lispro (HUMALOG) 100 UNIT/ML injection Inject 4-16 Units into the skin 3 (three) times daily with meals. Per sliding scale    . levocetirizine (XYZAL) 5 MG tablet Take 5 mg by mouth daily as needed for allergies.     . Melatonin 10 MG CAPS Take 10 mg by mouth at bedtime.     . metFORMIN (GLUCOPHAGE) 1000 MG tablet Take 1,000 mg by mouth 2 (two) times daily with a meal.    . methocarbamol (ROBAXIN) 750 MG tablet Take 750 mg by mouth every 8 (eight) hours as needed for muscle spasms.    Marland Kitchen MILK THISTLE PO Take 1 capsule by mouth 3 (three) times daily.    . Omega-3 Fatty Acids (OMEGA-3 PO) Take 1 capsule by mouth 3 (three) times daily.    Marland Kitchen OVER THE COUNTER MEDICATION Take 1 mL by mouth 2 (two) times daily. CBD Oil    . oxyCODONE-acetaminophen (PERCOCET) 10-325 MG tablet Take 1 tablet by mouth every 4 (four) hours as needed for pain. (Patient taking differently: Take 1 tablet by mouth See admin instructions. Take 1 tablet by mouth up to 7 times daily as needed for severe pain) 60 tablet 0  . Pancrelipase, Lip-Prot-Amyl, 24000 units CPEP Take 24,000 Units by mouth 3 (three) times daily with meals.     . Probiotic Product (PROBIOTIC PO) Take 1 capsule by mouth daily.    . tamsulosin  (FLOMAX) 0.4 MG CAPS capsule Take 0.4 mg by mouth daily.    Marland Kitchen testosterone cypionate (DEPOTESTOTERONE CYPIONATE) 200 MG/ML injection Inject 100 mg into the muscle every Thursday.     . umeclidinium-vilanterol (ANORO ELLIPTA) 62.5-25 MCG/INH AEPB Inhale 1 puff into the lungs daily as needed (for shortness of breath or wheezing).      Blood pressure  120/67, pulse 73, temperature (!) 97.4 F (36.3 C), temperature source Oral, resp. rate 18, height 5\' 5"  (1.651 m), weight 99.8 kg, SpO2 96 %. Physical Exam: Physical Exam Constitutional:      General: He is not in acute distress.    Appearance: He is well-developed. He is obese. He is not toxic-appearing.  Eyes:     General: Lids are normal.     Extraocular Movements: Extraocular movements intact.     Conjunctiva/sclera: Conjunctivae normal.  Cardiovascular:     Rate and Rhythm: Normal rate and regular rhythm.     Pulses:          Radial pulses are 2+ on the right side and 2+ on the left side.  Pulmonary:     Effort: Pulmonary effort is normal.  Abdominal:     General: Bowel sounds are decreased. There is distension.     Tenderness: There is generalized abdominal tenderness. There is guarding (voluntary ).     Comments: No palpable hernia   Neurological:     Mental Status: He is alert and oriented to person, place, and time.  Psychiatric:        Attention and Perception: Attention and perception normal.        Mood and Affect: Mood normal.        Behavior: Behavior is cooperative.     Results for orders placed or performed during the hospital encounter of 12/27/18 (from the past 48 hour(s))  Glucose, capillary     Status: Abnormal   Collection Time: 12/27/18  1:54 PM  Result Value Ref Range   Glucose-Capillary 137 (H) 70 - 99 mg/dL  Hemoglobin A1c     Status: Abnormal   Collection Time: 12/27/18  5:05 PM  Result Value Ref Range   Hgb A1c MFr Bld 6.9 (H) 4.8 - 5.6 %    Comment: (NOTE) Pre diabetes:          5.7%-6.4% Diabetes:               >6.4% Glycemic control for   <7.0% adults with diabetes    Mean Plasma Glucose 151.33 mg/dL    Comment: Performed at [facility] [address], [place], [place] [address]  Glucose, capillary     Status: Abnormal   Collection Time: 12/27/18  5:10 PM  Result Value Ref Range   Glucose-Capillary 149 (H) 70 - 99 mg/dL  Glucose, capillary     Status: Abnormal   Collection Time: 12/27/18  9:26 PM  Result Value Ref Range   Glucose-Capillary 259 (H) 70 - 99 mg/dL   Comment 1 Notify RN    Comment 2 Document in Chart   Glucose, capillary     Status: Abnormal   Collection Time: 12/28/18  6:31 AM  Result Value Ref Range   Glucose-Capillary 252 (H) 70 - 99 mg/dL   Comment 1 Notify RN    Comment 2 Document in Chart   Glucose, capillary     Status: Abnormal   Collection Time: 12/28/18 12:11 PM  Result Value Ref Range   Glucose-Capillary 170 (H) 70 - 99 mg/dL   Comment 1 Notify RN    Comment 2 Document in Chart   Glucose, capillary     Status: Abnormal   Collection Time: 12/28/18  4:27 PM  Result Value Ref Range   Glucose-Capillary 195 (H) 70 - 99 mg/dL  Glucose, capillary     Status: Abnormal   Collection Time: 12/28/18  9:14 PM  Result Value Ref Range   Glucose-Capillary 145 (H) 70 - 99 mg/dL   Comment 1 Notify RN    Comment 2 Document in Chart   Glucose, capillary     Status: Abnormal   Collection Time: 12/29/18  6:18 AM  Result Value Ref Range   Glucose-Capillary 117 (H) 70 - 99 mg/dL   Comment 1 Notify RN    Comment 2 Document in Chart    Dg Lumbar Spine Complete  Result Date: 12/27/2018 CLINICAL DATA:  Lumbar fusion EXAM: DG C-ARM 61-120 MIN; LUMBAR SPINE - COMPLETE 4+ VIEW COMPARISON:  None. FLUOROSCOPY TIME:  Fluoroscopy Time:  7 minutes 29 seconds Radiation Exposure Index (if provided by the fluoroscopic device): Not available Number of Acquired Spot Images: 4 FINDINGS: Interbody fusion is noted at L2-3 and L3-4 with pedicle screws and posterior fixation at  both levels. IMPRESSION: Lumbar fusion from L2-L4. Electronically Signed   By: Inez Catalina M.D.   On: 12/27/2018 15:42   Dg C-arm 1-60 Min  Result Date: 12/27/2018 CLINICAL DATA:  Lumbar fusion EXAM: DG C-ARM 61-120 MIN; LUMBAR SPINE - COMPLETE 4+ VIEW COMPARISON:  None. FLUOROSCOPY TIME:  Fluoroscopy Time:  7 minutes 29 seconds Radiation Exposure Index (if provided by the fluoroscopic device): Not available Number of Acquired Spot Images: 4 FINDINGS: Interbody fusion is noted at L2-3 and L3-4 with pedicle screws and posterior fixation at both levels. IMPRESSION: Lumbar fusion from L2-L4. Electronically Signed   By: Inez Catalina M.D.   On: 12/27/2018 15:42      Assessment/Plan Hx of Crohn's disease T2DM HLD Chronic pancreatitis BPH  Chronic back pain - S/P lumbar fusion 12/27/18 Dr. Rolena Infante  Abdominal pain and distention - no palpable incarcerated hernia on my exam - patient clinically not obstructed  - 2 view abdominal film - patient likely has some degree of ileus  - recommend mobilization, stool softeners as needed and bowel rest if this is the case - will discuss with attending MD   Brigid Re, [facility] 12/29/2018, 12:10 PM Pager: [phone] Consults: [phone] Mon-Fri 7:00 am-4:30 pm Sat-Sun 7:00 am-11:30 am

## 2018-12-29 NOTE — Progress Notes (Addendum)
Subjective: 2 Days Post-Op Procedure(s) (LRB): ANTERIOR LATERAL LUMBAR FUSION L2-4 (N/A) Patient reports pain as 5 on 0-10 scale.  Well managed on current medications. Pt is voiding. +flatus. +BM. Tolerating PO. Left thing "numbness" is improving and patient is able to ambulate well with rolling walker. Patient complains of umbilical hernia (present prior to surgery) pain worse with lying down flat. Denies calf pain, fevers/sweats/chills, nausea, HA, SOB.   Objective: Vital signs in last 24 hours: Temp:  [97.4 F (36.3 C)-98.1 F (36.7 C)] 97.4 F (36.3 C) (01/31 0438) Pulse Rate:  [73-124] 73 (01/31 0730) Resp:  [16-22] 18 (01/31 0730) BP: (120-145)/(67-80) 120/67 (01/31 0730) SpO2:  [95 %-96 %] 96 % (01/31 0730)  Intake/Output from previous day: No intake/output data recorded. Intake/Output this shift: No intake/output data recorded.  No results for input(s): HGB in the last 72 hours. No results for input(s): WBC, RBC, HCT, PLT in the last 72 hours. No results for input(s): NA, K, CL, CO2, BUN, CREATININE, GLUCOSE, CALCIUM in the last 72 hours. No results for input(s): LABPT, INR in the last 72 hours.    Umbilical hernia feels hard to touch. Patient states it was soft and reducible prior to surgery.  Positive LE dysathesias on right thigh LE motor 5/5 bilaterally Pulses 2+ bilaterally. Calfs non-tender, no palpable cords, negative Homan's.   - Unanticipated findings during/Post Surgery: Slow post-op progression: GI, pain control, mobility due to right leg numbness which gives difficulty ambulating, improving. Increased pain due to umbilical hernia which now feels firm rather than soft.     Assessment/Plan: 2 Days Post-Op Procedure(s) (LRB): ANTERIOR LATERAL LUMBAR FUSION L2-4 (N/A) Encourage IS DVT prophylaxis: Teds, SCDs, encourage ambulation PT/OT: OOB, ambulating with walker Pain well controlled with current meds. Consult to general surgery to evaluate umbilical  hernia.  Pt will f/u as an out-patient for a new LSO brace fitting Plan discharge home pending general surgery consult.     Yvonne Kendall Ward 12/29/2018, 11:00 AM  Addendum dictation Overall patient is slowly improving.  His gait pattern is more stable.  His principal complaint over the last 18 or so hours has been anterior abdominal pain.  He states that his umbilical hernia feels more firm and is very uncomfortable when he lays down.  As a result of his increased pain and discomfort I did request a general surgery consult to ensure that there is no significant pathology that needs to be addressed.  If it is felt that he is cleared then we can discharge him to home.  He will follow-up with me in 2 weeks.  Dr. Melina Schools

## 2018-12-29 NOTE — Progress Notes (Signed)
Patient alert and oriented, mae's well, voiding adequate amount of urine, swallowing without difficulty,  c/o pain at time of discharge and medication given. Patient was given laxatives for home use per MD consult: patient will be more comfortable at home.Patient discharged home with family. Script and discharged instructions given to patient. Patient and family stated understanding of instructions given. Patient has an appointment with Dr. Rolena Infante

## 2019-01-01 ENCOUNTER — Encounter (HOSPITAL_COMMUNITY): Payer: Self-pay | Admitting: Orthopedic Surgery

## 2019-01-04 NOTE — Discharge Summary (Signed)
Patient ID: Micheal Parker MRN: [id number] DOB/AGE: December 06, 1945 73 y.o.  Admit date: 12/27/2018 Discharge date: 01/04/2019  Admission Diagnoses:  Active Problems:   S/P lumbar fusion   Discharge Diagnoses:  Active Problems:   S/P lumbar fusion  status post Procedure(s): ANTERIOR LATERAL LUMBAR FUSION L2-4  Past Medical History:  Diagnosis Date  . Arthritis   . BPH (benign prostatic hypertrophy)   . Chronic back pain   . Chronic neck pain   . Chronic pancreatitis ([place])   . Confusion   . Crohn's disease ([place])    "in remission"  . Hyperlipidemia   . Numbness and tingling 06/23/2016   right arm and left leg  . Pericarditis    "not a problem now"  . Shortness of breath    "no problems now"  . Sleep apnea    cpap  . Type II diabetes mellitus ([facility])    Type 2  . Wears glasses     Surgeries: Procedure(s): ANTERIOR LATERAL LUMBAR FUSION L2-4 on 12/27/2018   Consultants: General surgery was contacted in regards to umbilical hernia. Dx with constipation. Mirilax recommended.   Discharged Condition: Improved  Physical Exam: Umbilical hernia feels hard to touch. Patient states it was soft and reducible prior to surgery.  Positive LE dysathesias on right thigh LE motor 5/5 bilaterally Pulses 2+ bilaterally. Calfs non-tender, no palpable cords, negative Homan's.  Hospital Course: Micheal Parker is an 73 y.o. male who was admitted 12/27/2018 for operative treatment of L2/3 radicular leg pain, weakness, dysethesias. Patient failed conservative treatments (please see the history and physical for the specifics) and had severe unremitting pain that affects sleep, daily activities and work/hobbies. After pre-op clearance, the patient was taken to the operating room on 12/27/2018 and underwent  Procedure(s): ANTERIOR LATERAL LUMBAR FUSION L2-4.    Patient was given perioperative antibiotics:  Anti-infectives (From admission, onward)   Start     Dose/Rate Route Frequency Ordered  Stop   12/27/18 1700  ceFAZolin (ANCEF) IVPB 1 g/50 mL premix     1 g 100 mL/hr over 30 Minutes Intravenous Every 8 hours 12/27/18 1648 12/28/18 0102   12/27/18 0727  ceFAZolin (ANCEF) IVPB 2g/100 mL premix     2 g 200 mL/hr over 30 Minutes Intravenous 30 min pre-op 12/27/18 0727 12/27/18 0900       Patient was given sequential compression devices and early ambulation to prevent DVT.   Patient benefited maximally from hospital stay and there were no complications. At the time of discharge, the patient was urinating/moving their bowels without difficulty, tolerating a regular diet, pain is controlled with oral pain medications and they have been cleared by PT/OT.   Recent vital signs: No data found.   Recent laboratory studies: No results for input(s): WBC, HGB, HCT, PLT, NA, K, CL, CO2, BUN, CREATININE, GLUCOSE, INR, CALCIUM in the last 72 hours.  Invalid input(s): PT, 2   Discharge Medications:   Allergies as of 12/29/2018      Reactions   Betadine [povidone Iodine] Anaphylaxis, Swelling   Iodine Anaphylaxis, Swelling   SEE CONTRAST AGENTS   Iohexol Anaphylaxis, Shortness Of Breath, Other (See Comments)    Code: SOB, Desc: pt states 10 yrs ago he had IV contrast @ [facility] and experienced severe sob., Onset Date: [id number]   Etodolac Other (See Comments)   unknown   Nsaids Other (See Comments)   REACTION: Chron's DZ; Caused rectal bleeding   Tape    Other reaction(s): Other (See Comments)  Medication List    STOP taking these medications   aspirin 81 MG tablet   gabapentin 600 MG tablet Commonly known as:  NEURONTIN   methocarbamol 750 MG tablet Commonly known as:  ROBAXIN   OVER THE COUNTER MEDICATION   oxyCODONE-acetaminophen 10-325 MG tablet Commonly known as:  PERCOCET     TAKE these medications   albuterol 108 (90 Base) MCG/ACT inhaler Commonly known as:  PROVENTIL HFA;VENTOLIN HFA Inhale 1 puff into the lungs every 6 (six) hours as needed for wheezing  or shortness of breath.   ANORO ELLIPTA 62.5-25 MCG/INH Aepb Generic drug:  umeclidinium-vilanterol Inhale 1 puff into the lungs daily as needed (for shortness of breath or wheezing).   DULoxetine 30 MG capsule Commonly known as:  CYMBALTA Take 30 mg by mouth 2 (two) times daily.   fluticasone 50 MCG/ACT nasal spray Commonly known as:  FLONASE Place 1 spray into both nostrils daily.   insulin glargine 100 UNIT/ML injection Commonly known as:  LANTUS Inject 60 Units into the skin at bedtime.   insulin lispro 100 UNIT/ML injection Commonly known as:  HUMALOG Inject 4-16 Units into the skin 3 (three) times daily with meals. Per sliding scale   JARDIANCE 25 MG Tabs tablet Generic drug:  empagliflozin Take 25 mg by mouth daily.   levocetirizine 5 MG tablet Commonly known as:  XYZAL Take 5 mg by mouth daily as needed for allergies.   Melatonin 10 MG Caps Take 10 mg by mouth at bedtime.   metFORMIN 1000 MG tablet Commonly known as:  GLUCOPHAGE Take 1,000 mg by mouth 2 (two) times daily with a meal.   MILK THISTLE PO Take 1 capsule by mouth 3 (three) times daily.   OMEGA-3 PO Take 1 capsule by mouth 3 (three) times daily.   ondansetron 4 MG tablet Commonly known as:  ZOFRAN Take 1 tablet (4 mg total) by mouth every 8 (eight) hours as needed for nausea or vomiting.   Pancrelipase (Lip-Prot-Amyl) 24000-76000 units Cpep Take 24,000 Units by mouth 3 (three) times daily with meals.   PROBIOTIC PO Take 1 capsule by mouth daily.   SUPER B COMPLEX PO Take 1 tablet by mouth daily.   tamsulosin 0.4 MG Caps capsule Commonly known as:  FLOMAX Take 0.4 mg by mouth daily.   testosterone cypionate 200 MG/ML injection Commonly known as:  DEPOTESTOSTERONE CYPIONATE Inject 100 mg into the muscle every Thursday.   Vitamin D3 25 MCG (1000 UT) Caps Take 1,000 Units by mouth daily.     ASK your doctor about these medications   methocarbamol 500 MG tablet Commonly known as:   ROBAXIN Take 1 tablet (500 mg total) by mouth every 8 (eight) hours as needed for up to 5 days for muscle spasms. Ask about: Should I take this medication?   oxyCODONE-acetaminophen 10-325 MG tablet Commonly known as:  PERCOCET Take 1 tablet by mouth every 6 (six) hours as needed for up to 5 days for pain. Ask about: Should I take this medication?       Diagnostic Studies: Dg Lumbar Spine Complete  Result Date: 12/27/2018 CLINICAL DATA:  Lumbar fusion EXAM: DG C-ARM 61-120 MIN; LUMBAR SPINE - COMPLETE 4+ VIEW COMPARISON:  None. FLUOROSCOPY TIME:  Fluoroscopy Time:  7 minutes 29 seconds Radiation Exposure Index (if provided by the fluoroscopic device): Not available Number of Acquired Spot Images: 4 FINDINGS: Interbody fusion is noted at L2-3 and L3-4 with pedicle screws and posterior fixation at both levels. IMPRESSION: Lumbar  fusion from L2-L4. Electronically Signed   By: Inez Catalina M.D.   On: 12/27/2018 15:42   Dg Abd 2 Views  Result Date: 12/29/2018 CLINICAL DATA:  Nausea, abdominal pain, back surgery 12/27/2018 EXAM: ABDOMEN - 2 VIEW COMPARISON:  None. FINDINGS: There is a moderate amount of stool in the ascending colon. There is no bowel dilatation to suggest obstruction. There is no evidence of pneumoperitoneum, portal venous gas or pneumatosis. There are no pathologic calcifications along the expected course of the ureters. There is posterior lumbar interbody fusion from L2 through L4. IMPRESSION: No acute abdominal abnormality. Electronically Signed   By: Kathreen Devoid   On: 12/29/2018 13:28   Dg C-arm 1-60 Min  Result Date: 12/27/2018 CLINICAL DATA:  Lumbar fusion EXAM: DG C-ARM 61-120 MIN; LUMBAR SPINE - COMPLETE 4+ VIEW COMPARISON:  None. FLUOROSCOPY TIME:  Fluoroscopy Time:  7 minutes 29 seconds Radiation Exposure Index (if provided by the fluoroscopic device): Not available Number of Acquired Spot Images: 4 FINDINGS: Interbody fusion is noted at L2-3 and L3-4 with pedicle screws  and posterior fixation at both levels. IMPRESSION: Lumbar fusion from L2-L4. Electronically Signed   By: Inez Catalina M.D.   On: 12/27/2018 15:42    Discharge Instructions    Incentive spirometry RT   Complete by:  As directed       Follow-up [place], Dahari, MD In 2 weeks.   Specialty:  Orthopedic Surgery Why:  If symptoms worsen, For suture removal, For wound re-check Contact information: [address] [place] [address] [phone]           Discharge Plan:  discharge to home. Using rolling walker PRN for ambulation, progress to ambulation without assistive devices as tolerated.   Disposition: Overall patient is doing exceptionally well. His post-op course was uncomplicated with the exception of increasing abdominal pain due to umbilical hernia and constipation (assessed by general surgery consult), and right leg weakness that has improved. His pre-operative left leg pain has improved. He has no progressing neurological deficits at this time. Pain well controlled on oral medications. Voiding, passing gas, tolerating PO. He has been cleared by PT and OT for a safe discharge home. He and his wife have expressed an understanding of the discharge instructions. He will follow up in office in 2 weeks. May come to office sooner for a re-fitting of his LSO brace.     Signed: Yvonne Kendall Ward for [facility] PA-C [facility] [phone] 01/04/2019, 10:21 AM

## 2019-01-10 DIAGNOSIS — M545 Low back pain: Secondary | ICD-10-CM | POA: Diagnosis not present

## 2019-01-10 DIAGNOSIS — M25561 Pain in right knee: Secondary | ICD-10-CM | POA: Diagnosis not present

## 2019-01-10 DIAGNOSIS — Z4889 Encounter for other specified surgical aftercare: Secondary | ICD-10-CM | POA: Diagnosis not present

## 2019-01-15 ENCOUNTER — Other Ambulatory Visit: Payer: Self-pay | Admitting: Orthopedic Surgery

## 2019-01-15 DIAGNOSIS — Z4889 Encounter for other specified surgical aftercare: Secondary | ICD-10-CM

## 2019-01-19 ENCOUNTER — Ambulatory Visit
Admission: RE | Admit: 2019-01-19 | Discharge: 2019-01-19 | Disposition: A | Discharge status: Home / Self Care | Payer: Medicare Other | Source: Ambulatory Visit | Attending: Orthopedic Surgery | Admitting: Orthopedic Surgery

## 2019-01-19 DIAGNOSIS — Z4889 Encounter for other specified surgical aftercare: Secondary | ICD-10-CM

## 2019-01-19 DIAGNOSIS — M5127 Other intervertebral disc displacement, lumbosacral region: Secondary | ICD-10-CM | POA: Diagnosis not present

## 2019-01-30 DIAGNOSIS — L309 Dermatitis, unspecified: Secondary | ICD-10-CM | POA: Diagnosis not present

## 2019-02-05 DIAGNOSIS — Z4889 Encounter for other specified surgical aftercare: Secondary | ICD-10-CM | POA: Diagnosis not present

## 2019-03-13 DIAGNOSIS — I10 Essential (primary) hypertension: Secondary | ICD-10-CM | POA: Diagnosis not present

## 2019-03-13 DIAGNOSIS — E1143 Type 2 diabetes mellitus with diabetic autonomic (poly)neuropathy: Secondary | ICD-10-CM | POA: Diagnosis not present

## 2019-03-13 DIAGNOSIS — E7849 Other hyperlipidemia: Secondary | ICD-10-CM | POA: Diagnosis not present

## 2019-03-13 DIAGNOSIS — N400 Enlarged prostate without lower urinary tract symptoms: Secondary | ICD-10-CM | POA: Diagnosis not present

## 2019-03-13 DIAGNOSIS — G4700 Insomnia, unspecified: Secondary | ICD-10-CM | POA: Diagnosis not present

## 2019-03-23 DIAGNOSIS — Z4889 Encounter for other specified surgical aftercare: Secondary | ICD-10-CM | POA: Diagnosis not present

## 2019-04-02 DIAGNOSIS — M65322 Trigger finger, left index finger: Secondary | ICD-10-CM | POA: Diagnosis not present

## 2019-04-04 DIAGNOSIS — M5093 Cervical disc disorder, unspecified, cervicothoracic region: Secondary | ICD-10-CM | POA: Diagnosis not present

## 2019-04-04 DIAGNOSIS — M79604 Pain in right leg: Secondary | ICD-10-CM | POA: Diagnosis not present

## 2019-04-04 DIAGNOSIS — M2560 Stiffness of unspecified joint, not elsewhere classified: Secondary | ICD-10-CM | POA: Diagnosis not present

## 2019-04-04 DIAGNOSIS — R262 Difficulty in walking, not elsewhere classified: Secondary | ICD-10-CM | POA: Diagnosis not present

## 2019-04-04 DIAGNOSIS — M4326 Fusion of spine, lumbar region: Secondary | ICD-10-CM | POA: Diagnosis not present

## 2019-04-04 DIAGNOSIS — R531 Weakness: Secondary | ICD-10-CM | POA: Diagnosis not present

## 2019-04-04 DIAGNOSIS — M5416 Radiculopathy, lumbar region: Secondary | ICD-10-CM | POA: Diagnosis not present

## 2019-04-04 DIAGNOSIS — M545 Low back pain: Secondary | ICD-10-CM | POA: Diagnosis not present

## 2019-04-04 IMAGING — RF DG LUMBAR SPINE COMPLETE 4+V
1 series · 4 of 4 positions shown · non-contrast
Comparison: None.

CLINICAL DATA: Lumbar fusion

EXAM:
DG C-ARM 61-120 MIN; LUMBAR SPINE - COMPLETE 4+ VIEW

[Series 1: run · 4 of 4 slices shown]
[im 1/4]
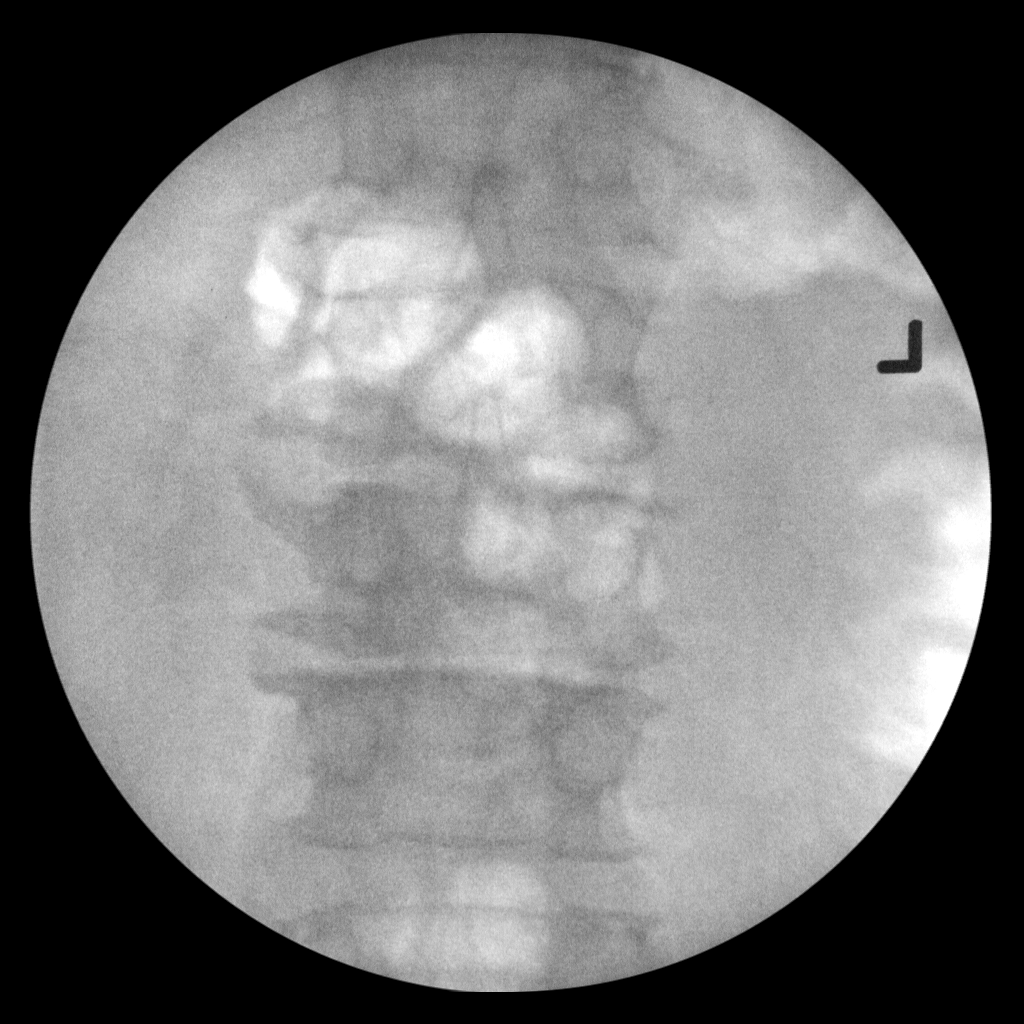
[im 2/4]
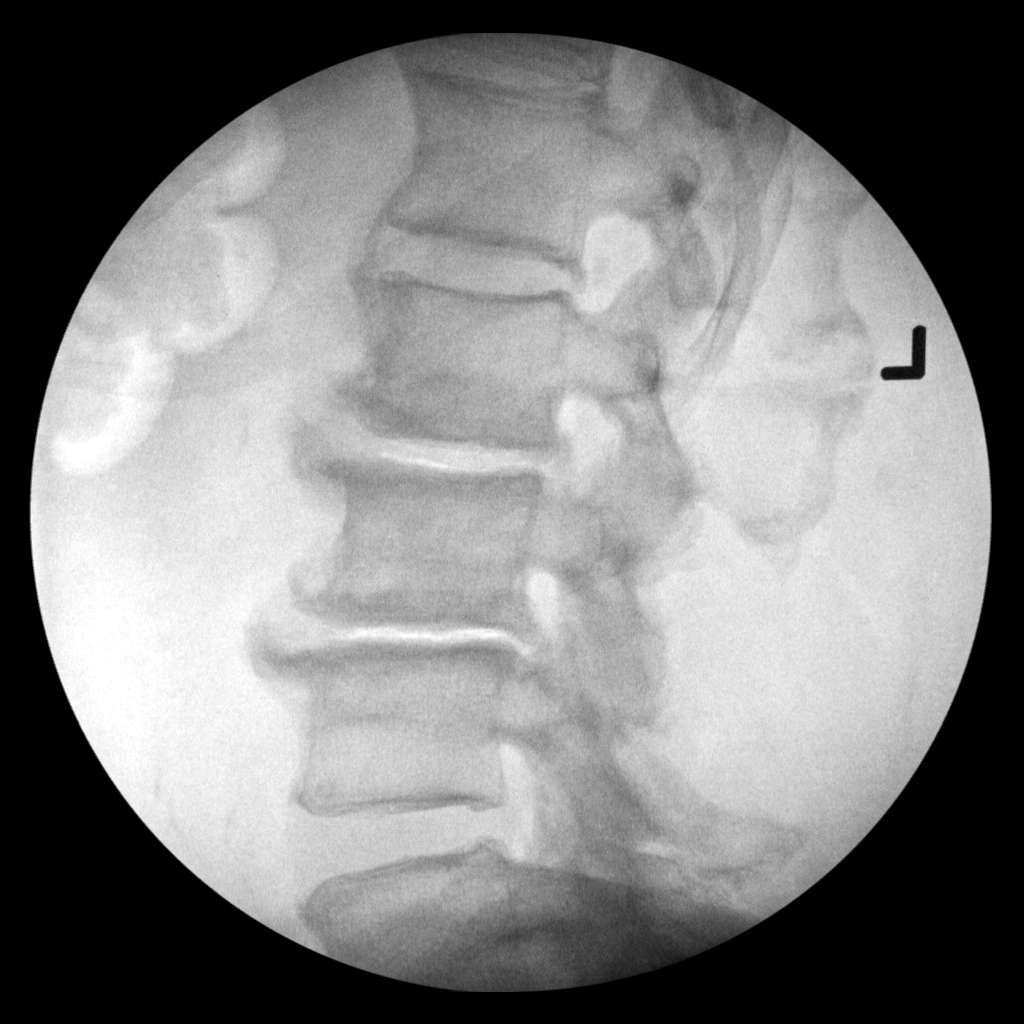
[im 3/4]
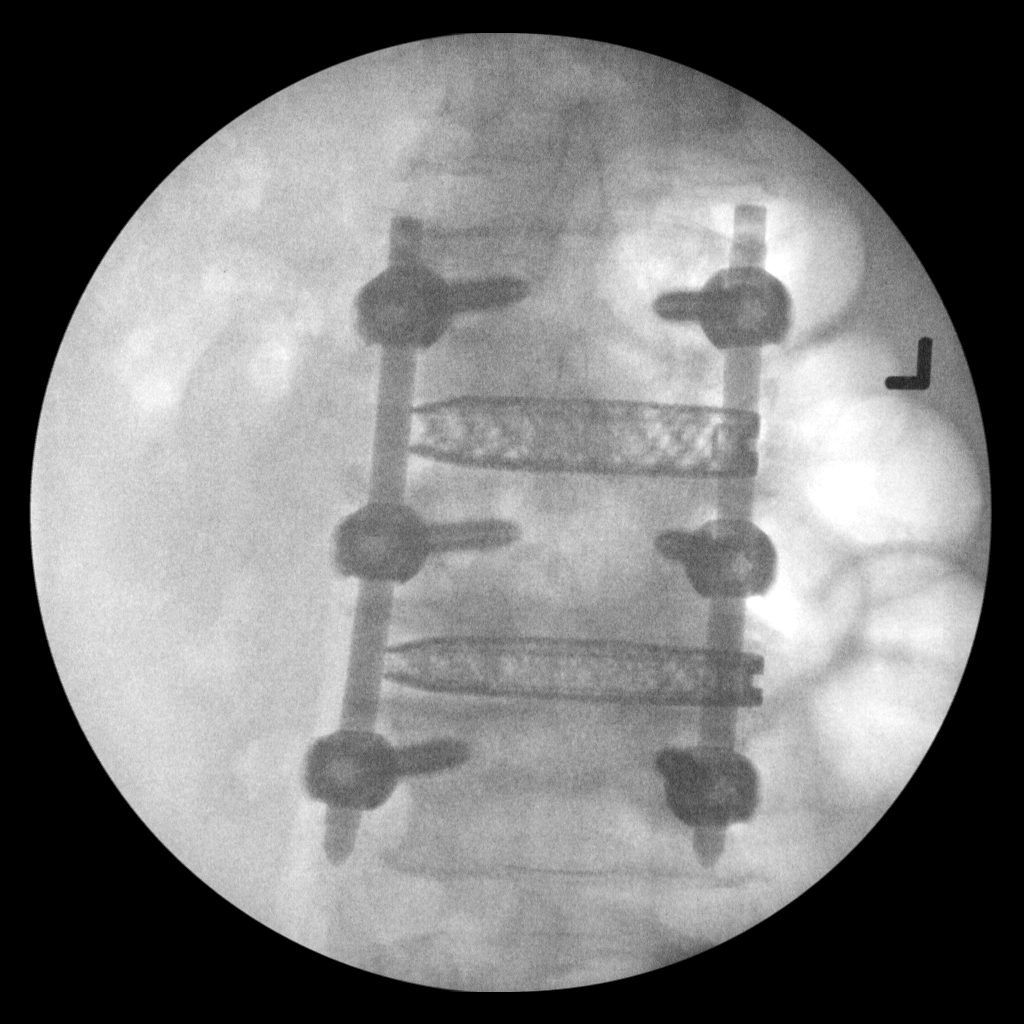
[im 4/4]
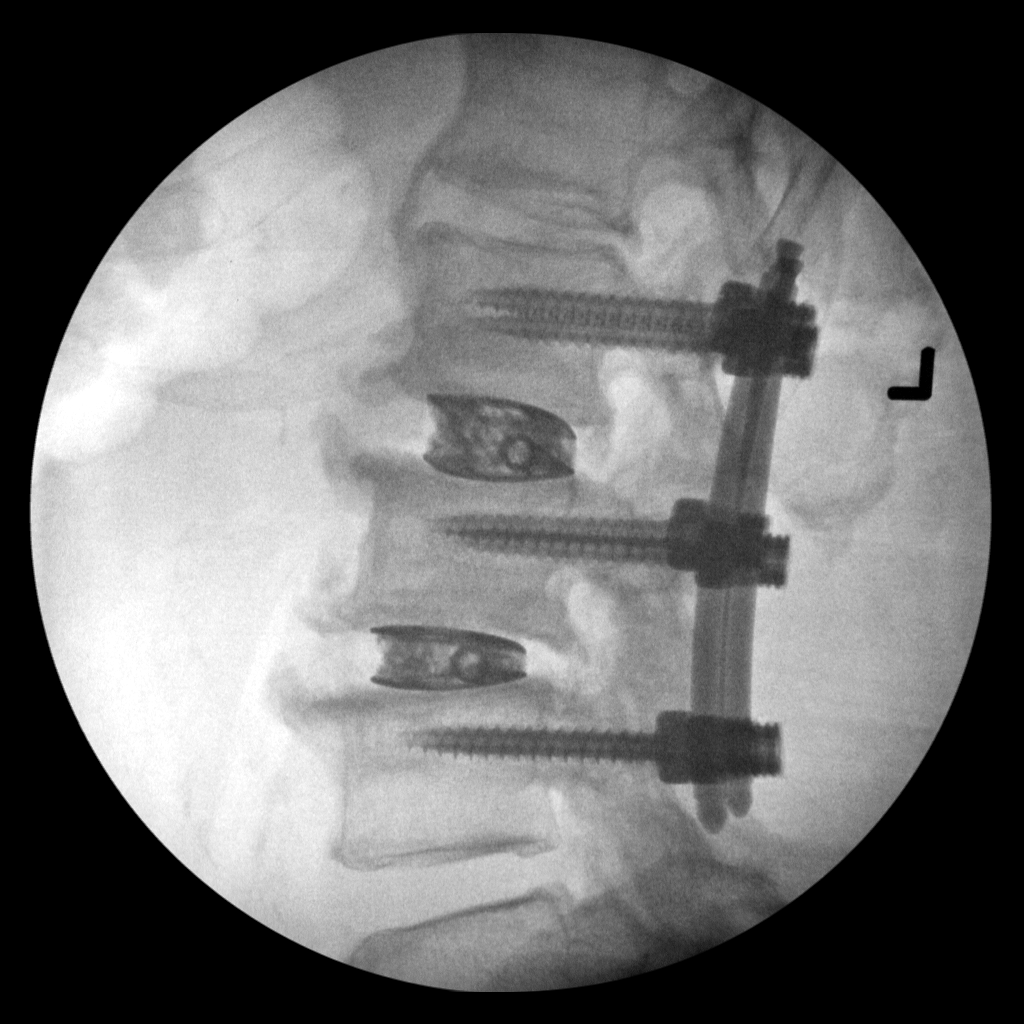

[4 of 4 positions shown; findings below may reference images not displayed]

FLUOROSCOPY TIME:  Fluoroscopy Time:  7 minutes 29 seconds

Radiation Exposure Index (if provided by the fluoroscopic device):
Not available

Number of Acquired Spot Images: 4
FINDINGS: Interbody fusion is noted at L2-3 and L3-4 with pedicle screws and
posterior fixation at both levels.
IMPRESSION: Lumbar fusion from L2-L4.

## 2019-04-19 DIAGNOSIS — R262 Difficulty in walking, not elsewhere classified: Secondary | ICD-10-CM | POA: Diagnosis not present

## 2019-04-19 DIAGNOSIS — M4326 Fusion of spine, lumbar region: Secondary | ICD-10-CM | POA: Diagnosis not present

## 2019-04-19 DIAGNOSIS — M5416 Radiculopathy, lumbar region: Secondary | ICD-10-CM | POA: Diagnosis not present

## 2019-04-19 DIAGNOSIS — M5093 Cervical disc disorder, unspecified, cervicothoracic region: Secondary | ICD-10-CM | POA: Diagnosis not present

## 2019-04-19 DIAGNOSIS — M545 Low back pain: Secondary | ICD-10-CM | POA: Diagnosis not present

## 2019-04-19 DIAGNOSIS — R531 Weakness: Secondary | ICD-10-CM | POA: Diagnosis not present

## 2019-04-19 DIAGNOSIS — M2560 Stiffness of unspecified joint, not elsewhere classified: Secondary | ICD-10-CM | POA: Diagnosis not present

## 2019-04-19 DIAGNOSIS — M79604 Pain in right leg: Secondary | ICD-10-CM | POA: Diagnosis not present

## 2019-04-20 DIAGNOSIS — M79645 Pain in left finger(s): Secondary | ICD-10-CM | POA: Diagnosis not present

## 2019-04-25 DIAGNOSIS — R262 Difficulty in walking, not elsewhere classified: Secondary | ICD-10-CM | POA: Diagnosis not present

## 2019-04-25 DIAGNOSIS — R531 Weakness: Secondary | ICD-10-CM | POA: Diagnosis not present

## 2019-04-25 DIAGNOSIS — M4326 Fusion of spine, lumbar region: Secondary | ICD-10-CM | POA: Diagnosis not present

## 2019-04-25 DIAGNOSIS — M5093 Cervical disc disorder, unspecified, cervicothoracic region: Secondary | ICD-10-CM | POA: Diagnosis not present

## 2019-04-25 DIAGNOSIS — M5416 Radiculopathy, lumbar region: Secondary | ICD-10-CM | POA: Diagnosis not present

## 2019-04-25 DIAGNOSIS — M2560 Stiffness of unspecified joint, not elsewhere classified: Secondary | ICD-10-CM | POA: Diagnosis not present

## 2019-04-25 DIAGNOSIS — M79604 Pain in right leg: Secondary | ICD-10-CM | POA: Diagnosis not present

## 2019-04-25 DIAGNOSIS — M545 Low back pain: Secondary | ICD-10-CM | POA: Diagnosis not present

## 2019-04-30 DIAGNOSIS — M79645 Pain in left finger(s): Secondary | ICD-10-CM | POA: Diagnosis not present

## 2019-04-30 DIAGNOSIS — Z4789 Encounter for other orthopedic aftercare: Secondary | ICD-10-CM | POA: Diagnosis not present

## 2019-04-30 DIAGNOSIS — M65321 Trigger finger, right index finger: Secondary | ICD-10-CM | POA: Diagnosis not present

## 2019-04-30 DIAGNOSIS — M65322 Trigger finger, left index finger: Secondary | ICD-10-CM | POA: Diagnosis not present

## 2019-05-02 DIAGNOSIS — R262 Difficulty in walking, not elsewhere classified: Secondary | ICD-10-CM | POA: Diagnosis not present

## 2019-05-02 DIAGNOSIS — M79604 Pain in right leg: Secondary | ICD-10-CM | POA: Diagnosis not present

## 2019-05-02 DIAGNOSIS — M5416 Radiculopathy, lumbar region: Secondary | ICD-10-CM | POA: Diagnosis not present

## 2019-05-02 DIAGNOSIS — M4326 Fusion of spine, lumbar region: Secondary | ICD-10-CM | POA: Diagnosis not present

## 2019-05-02 DIAGNOSIS — M5093 Cervical disc disorder, unspecified, cervicothoracic region: Secondary | ICD-10-CM | POA: Diagnosis not present

## 2019-05-02 DIAGNOSIS — M2560 Stiffness of unspecified joint, not elsewhere classified: Secondary | ICD-10-CM | POA: Diagnosis not present

## 2019-05-02 DIAGNOSIS — R531 Weakness: Secondary | ICD-10-CM | POA: Diagnosis not present

## 2019-05-02 DIAGNOSIS — M545 Low back pain: Secondary | ICD-10-CM | POA: Diagnosis not present

## 2019-05-03 DIAGNOSIS — M5416 Radiculopathy, lumbar region: Secondary | ICD-10-CM | POA: Diagnosis not present

## 2019-05-03 DIAGNOSIS — M2560 Stiffness of unspecified joint, not elsewhere classified: Secondary | ICD-10-CM | POA: Diagnosis not present

## 2019-05-03 DIAGNOSIS — M5093 Cervical disc disorder, unspecified, cervicothoracic region: Secondary | ICD-10-CM | POA: Diagnosis not present

## 2019-05-03 DIAGNOSIS — M79604 Pain in right leg: Secondary | ICD-10-CM | POA: Diagnosis not present

## 2019-05-03 DIAGNOSIS — M4326 Fusion of spine, lumbar region: Secondary | ICD-10-CM | POA: Diagnosis not present

## 2019-05-03 DIAGNOSIS — M545 Low back pain: Secondary | ICD-10-CM | POA: Diagnosis not present

## 2019-05-03 DIAGNOSIS — R531 Weakness: Secondary | ICD-10-CM | POA: Diagnosis not present

## 2019-05-03 DIAGNOSIS — R262 Difficulty in walking, not elsewhere classified: Secondary | ICD-10-CM | POA: Diagnosis not present

## 2019-05-07 DIAGNOSIS — R531 Weakness: Secondary | ICD-10-CM | POA: Diagnosis not present

## 2019-05-07 DIAGNOSIS — M79604 Pain in right leg: Secondary | ICD-10-CM | POA: Diagnosis not present

## 2019-05-07 DIAGNOSIS — M2560 Stiffness of unspecified joint, not elsewhere classified: Secondary | ICD-10-CM | POA: Diagnosis not present

## 2019-05-07 DIAGNOSIS — M5416 Radiculopathy, lumbar region: Secondary | ICD-10-CM | POA: Diagnosis not present

## 2019-05-07 DIAGNOSIS — M5093 Cervical disc disorder, unspecified, cervicothoracic region: Secondary | ICD-10-CM | POA: Diagnosis not present

## 2019-05-07 DIAGNOSIS — M4326 Fusion of spine, lumbar region: Secondary | ICD-10-CM | POA: Diagnosis not present

## 2019-05-07 DIAGNOSIS — M545 Low back pain: Secondary | ICD-10-CM | POA: Diagnosis not present

## 2019-05-07 DIAGNOSIS — R262 Difficulty in walking, not elsewhere classified: Secondary | ICD-10-CM | POA: Diagnosis not present

## 2019-05-09 DIAGNOSIS — M5136 Other intervertebral disc degeneration, lumbar region: Secondary | ICD-10-CM | POA: Diagnosis not present

## 2019-05-09 DIAGNOSIS — M5030 Other cervical disc degeneration, unspecified cervical region: Secondary | ICD-10-CM | POA: Diagnosis not present

## 2019-05-09 DIAGNOSIS — E119 Type 2 diabetes mellitus without complications: Secondary | ICD-10-CM | POA: Diagnosis not present

## 2019-05-09 DIAGNOSIS — Z794 Long term (current) use of insulin: Secondary | ICD-10-CM | POA: Diagnosis not present

## 2019-05-09 DIAGNOSIS — Z79891 Long term (current) use of opiate analgesic: Secondary | ICD-10-CM | POA: Diagnosis not present

## 2019-05-09 DIAGNOSIS — Z961 Presence of intraocular lens: Secondary | ICD-10-CM | POA: Diagnosis not present

## 2019-05-09 DIAGNOSIS — H40013 Open angle with borderline findings, low risk, bilateral: Secondary | ICD-10-CM | POA: Diagnosis not present

## 2019-05-09 DIAGNOSIS — Z981 Arthrodesis status: Secondary | ICD-10-CM | POA: Diagnosis not present

## 2019-05-14 DIAGNOSIS — M2560 Stiffness of unspecified joint, not elsewhere classified: Secondary | ICD-10-CM | POA: Diagnosis not present

## 2019-05-14 DIAGNOSIS — M79604 Pain in right leg: Secondary | ICD-10-CM | POA: Diagnosis not present

## 2019-05-14 DIAGNOSIS — M5093 Cervical disc disorder, unspecified, cervicothoracic region: Secondary | ICD-10-CM | POA: Diagnosis not present

## 2019-05-14 DIAGNOSIS — M4326 Fusion of spine, lumbar region: Secondary | ICD-10-CM | POA: Diagnosis not present

## 2019-05-14 DIAGNOSIS — M5416 Radiculopathy, lumbar region: Secondary | ICD-10-CM | POA: Diagnosis not present

## 2019-05-14 DIAGNOSIS — R531 Weakness: Secondary | ICD-10-CM | POA: Diagnosis not present

## 2019-05-14 DIAGNOSIS — M545 Low back pain: Secondary | ICD-10-CM | POA: Diagnosis not present

## 2019-05-14 DIAGNOSIS — R262 Difficulty in walking, not elsewhere classified: Secondary | ICD-10-CM | POA: Diagnosis not present

## 2019-05-16 DIAGNOSIS — M79604 Pain in right leg: Secondary | ICD-10-CM | POA: Diagnosis not present

## 2019-05-16 DIAGNOSIS — M4326 Fusion of spine, lumbar region: Secondary | ICD-10-CM | POA: Diagnosis not present

## 2019-05-16 DIAGNOSIS — M5093 Cervical disc disorder, unspecified, cervicothoracic region: Secondary | ICD-10-CM | POA: Diagnosis not present

## 2019-05-16 DIAGNOSIS — M2560 Stiffness of unspecified joint, not elsewhere classified: Secondary | ICD-10-CM | POA: Diagnosis not present

## 2019-05-16 DIAGNOSIS — M545 Low back pain: Secondary | ICD-10-CM | POA: Diagnosis not present

## 2019-05-16 DIAGNOSIS — R531 Weakness: Secondary | ICD-10-CM | POA: Diagnosis not present

## 2019-05-16 DIAGNOSIS — M5416 Radiculopathy, lumbar region: Secondary | ICD-10-CM | POA: Diagnosis not present

## 2019-05-16 DIAGNOSIS — R262 Difficulty in walking, not elsewhere classified: Secondary | ICD-10-CM | POA: Diagnosis not present

## 2019-05-22 DIAGNOSIS — M4326 Fusion of spine, lumbar region: Secondary | ICD-10-CM | POA: Diagnosis not present

## 2019-05-22 DIAGNOSIS — R262 Difficulty in walking, not elsewhere classified: Secondary | ICD-10-CM | POA: Diagnosis not present

## 2019-05-22 DIAGNOSIS — M79604 Pain in right leg: Secondary | ICD-10-CM | POA: Diagnosis not present

## 2019-05-22 DIAGNOSIS — M2560 Stiffness of unspecified joint, not elsewhere classified: Secondary | ICD-10-CM | POA: Diagnosis not present

## 2019-05-22 DIAGNOSIS — M5416 Radiculopathy, lumbar region: Secondary | ICD-10-CM | POA: Diagnosis not present

## 2019-05-22 DIAGNOSIS — M545 Low back pain: Secondary | ICD-10-CM | POA: Diagnosis not present

## 2019-05-22 DIAGNOSIS — M5093 Cervical disc disorder, unspecified, cervicothoracic region: Secondary | ICD-10-CM | POA: Diagnosis not present

## 2019-05-22 DIAGNOSIS — R531 Weakness: Secondary | ICD-10-CM | POA: Diagnosis not present

## 2019-05-28 DIAGNOSIS — M545 Low back pain: Secondary | ICD-10-CM | POA: Diagnosis not present

## 2019-05-28 DIAGNOSIS — M5093 Cervical disc disorder, unspecified, cervicothoracic region: Secondary | ICD-10-CM | POA: Diagnosis not present

## 2019-05-28 DIAGNOSIS — M79604 Pain in right leg: Secondary | ICD-10-CM | POA: Diagnosis not present

## 2019-05-28 DIAGNOSIS — R531 Weakness: Secondary | ICD-10-CM | POA: Diagnosis not present

## 2019-05-28 DIAGNOSIS — M2560 Stiffness of unspecified joint, not elsewhere classified: Secondary | ICD-10-CM | POA: Diagnosis not present

## 2019-05-28 DIAGNOSIS — M5416 Radiculopathy, lumbar region: Secondary | ICD-10-CM | POA: Diagnosis not present

## 2019-05-28 DIAGNOSIS — R262 Difficulty in walking, not elsewhere classified: Secondary | ICD-10-CM | POA: Diagnosis not present

## 2019-05-28 DIAGNOSIS — M4326 Fusion of spine, lumbar region: Secondary | ICD-10-CM | POA: Diagnosis not present

## 2019-05-31 DIAGNOSIS — R262 Difficulty in walking, not elsewhere classified: Secondary | ICD-10-CM | POA: Diagnosis not present

## 2019-05-31 DIAGNOSIS — R531 Weakness: Secondary | ICD-10-CM | POA: Diagnosis not present

## 2019-05-31 DIAGNOSIS — M4326 Fusion of spine, lumbar region: Secondary | ICD-10-CM | POA: Diagnosis not present

## 2019-05-31 DIAGNOSIS — M2560 Stiffness of unspecified joint, not elsewhere classified: Secondary | ICD-10-CM | POA: Diagnosis not present

## 2019-05-31 DIAGNOSIS — M545 Low back pain: Secondary | ICD-10-CM | POA: Diagnosis not present

## 2019-05-31 DIAGNOSIS — M5416 Radiculopathy, lumbar region: Secondary | ICD-10-CM | POA: Diagnosis not present

## 2019-05-31 DIAGNOSIS — M5093 Cervical disc disorder, unspecified, cervicothoracic region: Secondary | ICD-10-CM | POA: Diagnosis not present

## 2019-05-31 DIAGNOSIS — M79604 Pain in right leg: Secondary | ICD-10-CM | POA: Diagnosis not present

## 2019-06-13 DIAGNOSIS — N400 Enlarged prostate without lower urinary tract symptoms: Secondary | ICD-10-CM | POA: Diagnosis not present

## 2019-06-13 DIAGNOSIS — K439 Ventral hernia without obstruction or gangrene: Secondary | ICD-10-CM | POA: Diagnosis not present

## 2019-06-13 DIAGNOSIS — I10 Essential (primary) hypertension: Secondary | ICD-10-CM | POA: Diagnosis not present

## 2019-06-13 DIAGNOSIS — E1143 Type 2 diabetes mellitus with diabetic autonomic (poly)neuropathy: Secondary | ICD-10-CM | POA: Diagnosis not present

## 2019-06-13 DIAGNOSIS — E7849 Other hyperlipidemia: Secondary | ICD-10-CM | POA: Diagnosis not present

## 2019-06-26 DIAGNOSIS — Z981 Arthrodesis status: Secondary | ICD-10-CM | POA: Diagnosis not present

## 2019-06-26 DIAGNOSIS — M961 Postlaminectomy syndrome, not elsewhere classified: Secondary | ICD-10-CM | POA: Diagnosis not present

## 2019-06-28 ENCOUNTER — Other Ambulatory Visit: Payer: Self-pay

## 2019-06-28 DIAGNOSIS — M5030 Other cervical disc degeneration, unspecified cervical region: Secondary | ICD-10-CM | POA: Diagnosis not present

## 2019-07-19 DIAGNOSIS — M5136 Other intervertebral disc degeneration, lumbar region: Secondary | ICD-10-CM | POA: Diagnosis not present

## 2019-08-29 DIAGNOSIS — M545 Low back pain: Secondary | ICD-10-CM | POA: Diagnosis not present

## 2019-08-30 DIAGNOSIS — Z981 Arthrodesis status: Secondary | ICD-10-CM | POA: Diagnosis not present

## 2019-08-30 DIAGNOSIS — M4800 Spinal stenosis, site unspecified: Secondary | ICD-10-CM | POA: Diagnosis not present

## 2019-08-30 DIAGNOSIS — M5416 Radiculopathy, lumbar region: Secondary | ICD-10-CM | POA: Diagnosis not present

## 2019-09-02 DIAGNOSIS — Z23 Encounter for immunization: Secondary | ICD-10-CM | POA: Diagnosis not present

## 2019-09-11 DIAGNOSIS — M5136 Other intervertebral disc degeneration, lumbar region: Secondary | ICD-10-CM | POA: Diagnosis not present

## 2019-09-13 DIAGNOSIS — M5416 Radiculopathy, lumbar region: Secondary | ICD-10-CM | POA: Diagnosis not present

## 2019-09-13 DIAGNOSIS — Z79891 Long term (current) use of opiate analgesic: Secondary | ICD-10-CM | POA: Diagnosis not present

## 2019-09-13 DIAGNOSIS — Z5181 Encounter for therapeutic drug level monitoring: Secondary | ICD-10-CM | POA: Diagnosis not present

## 2019-09-13 DIAGNOSIS — M961 Postlaminectomy syndrome, not elsewhere classified: Secondary | ICD-10-CM | POA: Diagnosis not present

## 2019-09-13 DIAGNOSIS — M5030 Other cervical disc degeneration, unspecified cervical region: Secondary | ICD-10-CM | POA: Diagnosis not present

## 2019-09-13 DIAGNOSIS — M5136 Other intervertebral disc degeneration, lumbar region: Secondary | ICD-10-CM | POA: Diagnosis not present

## 2019-09-13 DIAGNOSIS — Z79899 Other long term (current) drug therapy: Secondary | ICD-10-CM | POA: Diagnosis not present

## 2019-09-19 DIAGNOSIS — N400 Enlarged prostate without lower urinary tract symptoms: Secondary | ICD-10-CM | POA: Diagnosis not present

## 2019-09-19 DIAGNOSIS — I10 Essential (primary) hypertension: Secondary | ICD-10-CM | POA: Diagnosis not present

## 2019-09-19 DIAGNOSIS — E1143 Type 2 diabetes mellitus with diabetic autonomic (poly)neuropathy: Secondary | ICD-10-CM | POA: Diagnosis not present

## 2019-09-19 DIAGNOSIS — Z1389 Encounter for screening for other disorder: Secondary | ICD-10-CM | POA: Diagnosis not present

## 2019-09-19 DIAGNOSIS — K439 Ventral hernia without obstruction or gangrene: Secondary | ICD-10-CM | POA: Diagnosis not present

## 2019-09-19 DIAGNOSIS — Z0000 Encounter for general adult medical examination without abnormal findings: Secondary | ICD-10-CM | POA: Diagnosis not present

## 2019-09-19 DIAGNOSIS — E7849 Other hyperlipidemia: Secondary | ICD-10-CM | POA: Diagnosis not present

## 2019-09-19 DIAGNOSIS — E1121 Type 2 diabetes mellitus with diabetic nephropathy: Secondary | ICD-10-CM | POA: Diagnosis not present

## 2019-10-02 DIAGNOSIS — M545 Low back pain: Secondary | ICD-10-CM | POA: Diagnosis not present

## 2019-10-02 DIAGNOSIS — Z981 Arthrodesis status: Secondary | ICD-10-CM | POA: Diagnosis not present

## 2019-10-04 DIAGNOSIS — M5030 Other cervical disc degeneration, unspecified cervical region: Secondary | ICD-10-CM | POA: Diagnosis not present

## 2019-10-16 DIAGNOSIS — G894 Chronic pain syndrome: Secondary | ICD-10-CM | POA: Diagnosis not present

## 2019-10-16 DIAGNOSIS — M5030 Other cervical disc degeneration, unspecified cervical region: Secondary | ICD-10-CM | POA: Diagnosis not present

## 2019-10-16 DIAGNOSIS — M961 Postlaminectomy syndrome, not elsewhere classified: Secondary | ICD-10-CM | POA: Diagnosis not present

## 2019-11-01 DIAGNOSIS — G894 Chronic pain syndrome: Secondary | ICD-10-CM | POA: Diagnosis not present

## 2019-12-06 DIAGNOSIS — J45909 Unspecified asthma, uncomplicated: Secondary | ICD-10-CM | POA: Diagnosis not present

## 2019-12-17 DIAGNOSIS — Z79891 Long term (current) use of opiate analgesic: Secondary | ICD-10-CM | POA: Diagnosis not present

## 2019-12-17 DIAGNOSIS — E669 Obesity, unspecified: Secondary | ICD-10-CM | POA: Diagnosis present

## 2019-12-17 DIAGNOSIS — S134XXA Sprain of ligaments of cervical spine, initial encounter: Secondary | ICD-10-CM | POA: Diagnosis not present

## 2019-12-17 DIAGNOSIS — S199XXA Unspecified injury of neck, initial encounter: Secondary | ICD-10-CM | POA: Diagnosis not present

## 2019-12-17 DIAGNOSIS — J9621 Acute and chronic respiratory failure with hypoxia: Secondary | ICD-10-CM | POA: Diagnosis not present

## 2019-12-17 DIAGNOSIS — R55 Syncope and collapse: Secondary | ICD-10-CM | POA: Diagnosis not present

## 2019-12-17 DIAGNOSIS — Z91048 Other nonmedicinal substance allergy status: Secondary | ICD-10-CM | POA: Diagnosis not present

## 2019-12-17 DIAGNOSIS — I272 Pulmonary hypertension, unspecified: Secondary | ICD-10-CM | POA: Diagnosis present

## 2019-12-17 DIAGNOSIS — R7989 Other specified abnormal findings of blood chemistry: Secondary | ICD-10-CM | POA: Diagnosis not present

## 2019-12-17 DIAGNOSIS — Z9181 History of falling: Secondary | ICD-10-CM | POA: Diagnosis not present

## 2019-12-17 DIAGNOSIS — S40012A Contusion of left shoulder, initial encounter: Secondary | ICD-10-CM | POA: Diagnosis present

## 2019-12-17 DIAGNOSIS — R0600 Dyspnea, unspecified: Secondary | ICD-10-CM | POA: Diagnosis not present

## 2019-12-17 DIAGNOSIS — S0001XA Abrasion of scalp, initial encounter: Secondary | ICD-10-CM | POA: Diagnosis present

## 2019-12-17 DIAGNOSIS — E860 Dehydration: Secondary | ICD-10-CM | POA: Diagnosis present

## 2019-12-17 DIAGNOSIS — D45 Polycythemia vera: Secondary | ICD-10-CM | POA: Diagnosis not present

## 2019-12-17 DIAGNOSIS — Z91041 Radiographic dye allergy status: Secondary | ICD-10-CM | POA: Diagnosis not present

## 2019-12-17 DIAGNOSIS — N400 Enlarged prostate without lower urinary tract symptoms: Secondary | ICD-10-CM | POA: Diagnosis present

## 2019-12-17 DIAGNOSIS — S098XXA Other specified injuries of head, initial encounter: Secondary | ICD-10-CM | POA: Diagnosis not present

## 2019-12-17 DIAGNOSIS — M79604 Pain in right leg: Secondary | ICD-10-CM | POA: Diagnosis not present

## 2019-12-17 DIAGNOSIS — Z794 Long term (current) use of insulin: Secondary | ICD-10-CM | POA: Diagnosis not present

## 2019-12-17 DIAGNOSIS — Z888 Allergy status to other drugs, medicaments and biological substances status: Secondary | ICD-10-CM | POA: Diagnosis not present

## 2019-12-17 DIAGNOSIS — J9601 Acute respiratory failure with hypoxia: Secondary | ICD-10-CM | POA: Diagnosis not present

## 2019-12-17 DIAGNOSIS — Z20822 Contact with and (suspected) exposure to covid-19: Secondary | ICD-10-CM | POA: Diagnosis present

## 2019-12-17 DIAGNOSIS — S0990XA Unspecified injury of head, initial encounter: Secondary | ICD-10-CM | POA: Diagnosis not present

## 2019-12-17 DIAGNOSIS — M79605 Pain in left leg: Secondary | ICD-10-CM | POA: Diagnosis not present

## 2019-12-17 DIAGNOSIS — D751 Secondary polycythemia: Secondary | ICD-10-CM | POA: Diagnosis present

## 2019-12-17 DIAGNOSIS — E119 Type 2 diabetes mellitus without complications: Secondary | ICD-10-CM | POA: Diagnosis not present

## 2019-12-17 DIAGNOSIS — G4733 Obstructive sleep apnea (adult) (pediatric): Secondary | ICD-10-CM | POA: Diagnosis present

## 2019-12-17 DIAGNOSIS — N179 Acute kidney failure, unspecified: Secondary | ICD-10-CM | POA: Diagnosis present

## 2019-12-17 DIAGNOSIS — J449 Chronic obstructive pulmonary disease, unspecified: Secondary | ICD-10-CM | POA: Diagnosis not present

## 2019-12-17 DIAGNOSIS — K861 Other chronic pancreatitis: Secondary | ICD-10-CM | POA: Diagnosis present

## 2019-12-17 DIAGNOSIS — Z79899 Other long term (current) drug therapy: Secondary | ICD-10-CM | POA: Diagnosis not present

## 2019-12-17 DIAGNOSIS — E1165 Type 2 diabetes mellitus with hyperglycemia: Secondary | ICD-10-CM | POA: Diagnosis present

## 2019-12-17 DIAGNOSIS — Z6838 Body mass index (BMI) 38.0-38.9, adult: Secondary | ICD-10-CM | POA: Diagnosis not present

## 2019-12-26 DIAGNOSIS — R55 Syncope and collapse: Secondary | ICD-10-CM | POA: Diagnosis not present

## 2019-12-28 DIAGNOSIS — Z23 Encounter for immunization: Secondary | ICD-10-CM | POA: Diagnosis not present

## 2020-01-01 DIAGNOSIS — N400 Enlarged prostate without lower urinary tract symptoms: Secondary | ICD-10-CM | POA: Diagnosis not present

## 2020-01-01 DIAGNOSIS — E1143 Type 2 diabetes mellitus with diabetic autonomic (poly)neuropathy: Secondary | ICD-10-CM | POA: Diagnosis not present

## 2020-01-01 DIAGNOSIS — I10 Essential (primary) hypertension: Secondary | ICD-10-CM | POA: Diagnosis not present

## 2020-01-01 DIAGNOSIS — J45909 Unspecified asthma, uncomplicated: Secondary | ICD-10-CM | POA: Diagnosis not present

## 2020-01-03 DIAGNOSIS — E1142 Type 2 diabetes mellitus with diabetic polyneuropathy: Secondary | ICD-10-CM | POA: Diagnosis not present

## 2020-01-03 DIAGNOSIS — N19 Unspecified kidney failure: Secondary | ICD-10-CM | POA: Diagnosis not present

## 2020-01-03 DIAGNOSIS — G4733 Obstructive sleep apnea (adult) (pediatric): Secondary | ICD-10-CM | POA: Diagnosis not present

## 2020-01-03 DIAGNOSIS — I10 Essential (primary) hypertension: Secondary | ICD-10-CM | POA: Diagnosis not present

## 2020-01-03 DIAGNOSIS — D751 Secondary polycythemia: Secondary | ICD-10-CM | POA: Diagnosis not present

## 2020-01-03 DIAGNOSIS — I272 Pulmonary hypertension, unspecified: Secondary | ICD-10-CM | POA: Diagnosis not present

## 2020-01-03 DIAGNOSIS — Z794 Long term (current) use of insulin: Secondary | ICD-10-CM | POA: Diagnosis not present

## 2020-01-07 DIAGNOSIS — Z794 Long term (current) use of insulin: Secondary | ICD-10-CM | POA: Diagnosis not present

## 2020-01-07 DIAGNOSIS — E1142 Type 2 diabetes mellitus with diabetic polyneuropathy: Secondary | ICD-10-CM | POA: Diagnosis not present

## 2020-01-07 DIAGNOSIS — N19 Unspecified kidney failure: Secondary | ICD-10-CM | POA: Diagnosis not present

## 2020-01-07 DIAGNOSIS — D751 Secondary polycythemia: Secondary | ICD-10-CM | POA: Diagnosis not present

## 2020-01-07 DIAGNOSIS — G4733 Obstructive sleep apnea (adult) (pediatric): Secondary | ICD-10-CM | POA: Diagnosis not present

## 2020-01-08 DIAGNOSIS — M5030 Other cervical disc degeneration, unspecified cervical region: Secondary | ICD-10-CM | POA: Diagnosis not present

## 2020-01-08 DIAGNOSIS — Z79891 Long term (current) use of opiate analgesic: Secondary | ICD-10-CM | POA: Diagnosis not present

## 2020-01-08 DIAGNOSIS — M5136 Other intervertebral disc degeneration, lumbar region: Secondary | ICD-10-CM | POA: Diagnosis not present

## 2020-01-14 DIAGNOSIS — D751 Secondary polycythemia: Secondary | ICD-10-CM | POA: Diagnosis not present

## 2020-01-21 DIAGNOSIS — Z794 Long term (current) use of insulin: Secondary | ICD-10-CM | POA: Diagnosis not present

## 2020-01-21 DIAGNOSIS — N19 Unspecified kidney failure: Secondary | ICD-10-CM | POA: Diagnosis not present

## 2020-01-21 DIAGNOSIS — E1142 Type 2 diabetes mellitus with diabetic polyneuropathy: Secondary | ICD-10-CM | POA: Diagnosis not present

## 2020-01-21 DIAGNOSIS — D751 Secondary polycythemia: Secondary | ICD-10-CM | POA: Diagnosis not present

## 2020-01-21 DIAGNOSIS — G4733 Obstructive sleep apnea (adult) (pediatric): Secondary | ICD-10-CM | POA: Diagnosis not present

## 2020-01-24 DIAGNOSIS — K7581 Nonalcoholic steatohepatitis (NASH): Secondary | ICD-10-CM | POA: Diagnosis not present

## 2020-01-24 DIAGNOSIS — E291 Testicular hypofunction: Secondary | ICD-10-CM | POA: Diagnosis not present

## 2020-01-24 DIAGNOSIS — D751 Secondary polycythemia: Secondary | ICD-10-CM | POA: Diagnosis not present

## 2020-01-24 DIAGNOSIS — E1142 Type 2 diabetes mellitus with diabetic polyneuropathy: Secondary | ICD-10-CM | POA: Diagnosis not present

## 2020-01-24 DIAGNOSIS — M5416 Radiculopathy, lumbar region: Secondary | ICD-10-CM | POA: Diagnosis not present

## 2020-01-24 DIAGNOSIS — D759 Disease of blood and blood-forming organs, unspecified: Secondary | ICD-10-CM | POA: Diagnosis not present

## 2020-01-24 DIAGNOSIS — D6861 Antiphospholipid syndrome: Secondary | ICD-10-CM | POA: Diagnosis not present

## 2020-01-24 DIAGNOSIS — L739 Follicular disorder, unspecified: Secondary | ICD-10-CM | POA: Diagnosis not present

## 2020-01-24 DIAGNOSIS — E8809 Other disorders of plasma-protein metabolism, not elsewhere classified: Secondary | ICD-10-CM | POA: Diagnosis not present

## 2020-01-24 DIAGNOSIS — Z794 Long term (current) use of insulin: Secondary | ICD-10-CM | POA: Diagnosis not present

## 2020-01-24 DIAGNOSIS — D500 Iron deficiency anemia secondary to blood loss (chronic): Secondary | ICD-10-CM | POA: Diagnosis not present

## 2020-01-24 DIAGNOSIS — I2782 Chronic pulmonary embolism: Secondary | ICD-10-CM | POA: Diagnosis not present

## 2020-01-24 DIAGNOSIS — I209 Angina pectoris, unspecified: Secondary | ICD-10-CM | POA: Diagnosis not present

## 2020-01-24 DIAGNOSIS — I272 Pulmonary hypertension, unspecified: Secondary | ICD-10-CM | POA: Diagnosis not present

## 2020-01-24 DIAGNOSIS — G4733 Obstructive sleep apnea (adult) (pediatric): Secondary | ICD-10-CM | POA: Diagnosis not present

## 2020-01-24 DIAGNOSIS — D6859 Other primary thrombophilia: Secondary | ICD-10-CM | POA: Diagnosis not present

## 2020-01-25 DIAGNOSIS — Z23 Encounter for immunization: Secondary | ICD-10-CM | POA: Diagnosis not present

## 2020-01-28 DIAGNOSIS — D751 Secondary polycythemia: Secondary | ICD-10-CM | POA: Diagnosis not present

## 2020-01-31 DIAGNOSIS — I288 Other diseases of pulmonary vessels: Secondary | ICD-10-CM | POA: Diagnosis not present

## 2020-01-31 DIAGNOSIS — D751 Secondary polycythemia: Secondary | ICD-10-CM | POA: Diagnosis not present

## 2020-01-31 DIAGNOSIS — I313 Pericardial effusion (noninflammatory): Secondary | ICD-10-CM | POA: Diagnosis not present

## 2020-01-31 DIAGNOSIS — I700 Atherosclerosis of aorta: Secondary | ICD-10-CM | POA: Diagnosis not present

## 2020-01-31 DIAGNOSIS — I272 Pulmonary hypertension, unspecified: Secondary | ICD-10-CM | POA: Diagnosis not present

## 2020-02-04 DIAGNOSIS — I272 Pulmonary hypertension, unspecified: Secondary | ICD-10-CM | POA: Diagnosis not present

## 2020-02-04 DIAGNOSIS — Z794 Long term (current) use of insulin: Secondary | ICD-10-CM | POA: Diagnosis not present

## 2020-02-04 DIAGNOSIS — D751 Secondary polycythemia: Secondary | ICD-10-CM | POA: Diagnosis not present

## 2020-02-04 DIAGNOSIS — E1142 Type 2 diabetes mellitus with diabetic polyneuropathy: Secondary | ICD-10-CM | POA: Diagnosis not present

## 2020-02-04 DIAGNOSIS — G4733 Obstructive sleep apnea (adult) (pediatric): Secondary | ICD-10-CM | POA: Diagnosis not present

## 2020-02-04 DIAGNOSIS — R768 Other specified abnormal immunological findings in serum: Secondary | ICD-10-CM | POA: Diagnosis not present

## 2020-02-04 DIAGNOSIS — D6861 Antiphospholipid syndrome: Secondary | ICD-10-CM | POA: Diagnosis not present

## 2020-02-04 DIAGNOSIS — N19 Unspecified kidney failure: Secondary | ICD-10-CM | POA: Diagnosis not present

## 2020-02-08 DIAGNOSIS — Z794 Long term (current) use of insulin: Secondary | ICD-10-CM | POA: Diagnosis not present

## 2020-02-08 DIAGNOSIS — J449 Chronic obstructive pulmonary disease, unspecified: Secondary | ICD-10-CM | POA: Diagnosis present

## 2020-02-08 DIAGNOSIS — R4020 Unspecified coma: Secondary | ICD-10-CM | POA: Diagnosis not present

## 2020-02-08 DIAGNOSIS — G4733 Obstructive sleep apnea (adult) (pediatric): Secondary | ICD-10-CM | POA: Diagnosis present

## 2020-02-08 DIAGNOSIS — Z6837 Body mass index (BMI) 37.0-37.9, adult: Secondary | ICD-10-CM | POA: Diagnosis not present

## 2020-02-08 DIAGNOSIS — E871 Hypo-osmolality and hyponatremia: Secondary | ICD-10-CM | POA: Diagnosis present

## 2020-02-08 DIAGNOSIS — Z86711 Personal history of pulmonary embolism: Secondary | ICD-10-CM | POA: Diagnosis not present

## 2020-02-08 DIAGNOSIS — K861 Other chronic pancreatitis: Secondary | ICD-10-CM | POA: Diagnosis present

## 2020-02-08 DIAGNOSIS — Z01810 Encounter for preprocedural cardiovascular examination: Secondary | ICD-10-CM | POA: Diagnosis not present

## 2020-02-08 DIAGNOSIS — R0602 Shortness of breath: Secondary | ICD-10-CM | POA: Diagnosis not present

## 2020-02-08 DIAGNOSIS — E1129 Type 2 diabetes mellitus with other diabetic kidney complication: Secondary | ICD-10-CM | POA: Diagnosis not present

## 2020-02-08 DIAGNOSIS — K5090 Crohn's disease, unspecified, without complications: Secondary | ICD-10-CM | POA: Diagnosis present

## 2020-02-08 DIAGNOSIS — I2699 Other pulmonary embolism without acute cor pulmonale: Secondary | ICD-10-CM | POA: Diagnosis not present

## 2020-02-08 DIAGNOSIS — I272 Pulmonary hypertension, unspecified: Secondary | ICD-10-CM | POA: Diagnosis present

## 2020-02-08 DIAGNOSIS — R42 Dizziness and giddiness: Secondary | ICD-10-CM | POA: Diagnosis not present

## 2020-02-08 DIAGNOSIS — E1165 Type 2 diabetes mellitus with hyperglycemia: Secondary | ICD-10-CM | POA: Diagnosis present

## 2020-02-08 DIAGNOSIS — S0083XA Contusion of other part of head, initial encounter: Secondary | ICD-10-CM | POA: Diagnosis not present

## 2020-02-08 DIAGNOSIS — R0902 Hypoxemia: Secondary | ICD-10-CM | POA: Diagnosis not present

## 2020-02-08 DIAGNOSIS — D751 Secondary polycythemia: Secondary | ICD-10-CM | POA: Diagnosis present

## 2020-02-08 DIAGNOSIS — N400 Enlarged prostate without lower urinary tract symptoms: Secondary | ICD-10-CM | POA: Diagnosis present

## 2020-02-08 DIAGNOSIS — Z9981 Dependence on supplemental oxygen: Secondary | ICD-10-CM | POA: Diagnosis not present

## 2020-02-08 DIAGNOSIS — T502X5A Adverse effect of carbonic-anhydrase inhibitors, benzothiadiazides and other diuretics, initial encounter: Secondary | ICD-10-CM | POA: Diagnosis present

## 2020-02-08 DIAGNOSIS — E860 Dehydration: Secondary | ICD-10-CM | POA: Diagnosis present

## 2020-02-08 DIAGNOSIS — G8929 Other chronic pain: Secondary | ICD-10-CM | POA: Diagnosis present

## 2020-02-08 DIAGNOSIS — E669 Obesity, unspecified: Secondary | ICD-10-CM | POA: Diagnosis present

## 2020-02-08 DIAGNOSIS — N170 Acute kidney failure with tubular necrosis: Secondary | ICD-10-CM | POA: Diagnosis present

## 2020-02-08 DIAGNOSIS — N179 Acute kidney failure, unspecified: Secondary | ICD-10-CM | POA: Diagnosis not present

## 2020-02-08 DIAGNOSIS — Z7901 Long term (current) use of anticoagulants: Secondary | ICD-10-CM | POA: Diagnosis not present

## 2020-02-08 DIAGNOSIS — W0110XA Fall on same level from slipping, tripping and stumbling with subsequent striking against unspecified object, initial encounter: Secondary | ICD-10-CM | POA: Diagnosis not present

## 2020-02-08 DIAGNOSIS — Z832 Family history of diseases of the blood and blood-forming organs and certain disorders involving the immune mechanism: Secondary | ICD-10-CM | POA: Diagnosis not present

## 2020-02-08 DIAGNOSIS — I951 Orthostatic hypotension: Secondary | ICD-10-CM | POA: Diagnosis present

## 2020-02-08 DIAGNOSIS — R0600 Dyspnea, unspecified: Secondary | ICD-10-CM | POA: Diagnosis not present

## 2020-02-08 DIAGNOSIS — S0093XA Contusion of unspecified part of head, initial encounter: Secondary | ICD-10-CM | POA: Diagnosis present

## 2020-02-08 DIAGNOSIS — R0689 Other abnormalities of breathing: Secondary | ICD-10-CM | POA: Diagnosis not present

## 2020-02-08 DIAGNOSIS — R55 Syncope and collapse: Secondary | ICD-10-CM | POA: Diagnosis not present

## 2020-02-08 DIAGNOSIS — J9621 Acute and chronic respiratory failure with hypoxia: Secondary | ICD-10-CM | POA: Diagnosis present

## 2020-02-08 DIAGNOSIS — R064 Hyperventilation: Secondary | ICD-10-CM | POA: Diagnosis not present

## 2020-02-08 DIAGNOSIS — E878 Other disorders of electrolyte and fluid balance, not elsewhere classified: Secondary | ICD-10-CM | POA: Diagnosis present

## 2020-02-08 DIAGNOSIS — I071 Rheumatic tricuspid insufficiency: Secondary | ICD-10-CM | POA: Diagnosis present

## 2020-02-12 DIAGNOSIS — Z91041 Radiographic dye allergy status: Secondary | ICD-10-CM | POA: Diagnosis not present

## 2020-02-12 DIAGNOSIS — D6861 Antiphospholipid syndrome: Secondary | ICD-10-CM | POA: Diagnosis not present

## 2020-02-12 DIAGNOSIS — Z794 Long term (current) use of insulin: Secondary | ICD-10-CM | POA: Diagnosis not present

## 2020-02-12 DIAGNOSIS — I371 Nonrheumatic pulmonary valve insufficiency: Secondary | ICD-10-CM | POA: Diagnosis present

## 2020-02-12 DIAGNOSIS — R0902 Hypoxemia: Secondary | ICD-10-CM | POA: Diagnosis not present

## 2020-02-12 DIAGNOSIS — R760 Raised antibody titer: Secondary | ICD-10-CM | POA: Diagnosis not present

## 2020-02-12 DIAGNOSIS — R1032 Left lower quadrant pain: Secondary | ICD-10-CM | POA: Diagnosis not present

## 2020-02-12 DIAGNOSIS — J90 Pleural effusion, not elsewhere classified: Secondary | ICD-10-CM | POA: Diagnosis not present

## 2020-02-12 DIAGNOSIS — R58 Hemorrhage, not elsewhere classified: Secondary | ICD-10-CM | POA: Diagnosis not present

## 2020-02-12 DIAGNOSIS — E1165 Type 2 diabetes mellitus with hyperglycemia: Secondary | ICD-10-CM | POA: Diagnosis present

## 2020-02-12 DIAGNOSIS — M7918 Myalgia, other site: Secondary | ICD-10-CM | POA: Diagnosis not present

## 2020-02-12 DIAGNOSIS — W19XXXA Unspecified fall, initial encounter: Secondary | ICD-10-CM | POA: Diagnosis not present

## 2020-02-12 DIAGNOSIS — R768 Other specified abnormal immunological findings in serum: Secondary | ICD-10-CM | POA: Diagnosis not present

## 2020-02-12 DIAGNOSIS — M47812 Spondylosis without myelopathy or radiculopathy, cervical region: Secondary | ICD-10-CM | POA: Diagnosis present

## 2020-02-12 DIAGNOSIS — R109 Unspecified abdominal pain: Secondary | ICD-10-CM | POA: Diagnosis not present

## 2020-02-12 DIAGNOSIS — Z20822 Contact with and (suspected) exposure to covid-19: Secondary | ICD-10-CM | POA: Diagnosis not present

## 2020-02-12 DIAGNOSIS — R55 Syncope and collapse: Secondary | ICD-10-CM | POA: Diagnosis present

## 2020-02-12 DIAGNOSIS — I5030 Unspecified diastolic (congestive) heart failure: Secondary | ICD-10-CM | POA: Diagnosis not present

## 2020-02-12 DIAGNOSIS — R0602 Shortness of breath: Secondary | ICD-10-CM | POA: Diagnosis not present

## 2020-02-12 DIAGNOSIS — E1140 Type 2 diabetes mellitus with diabetic neuropathy, unspecified: Secondary | ICD-10-CM | POA: Diagnosis present

## 2020-02-12 DIAGNOSIS — R918 Other nonspecific abnormal finding of lung field: Secondary | ICD-10-CM | POA: Diagnosis not present

## 2020-02-12 DIAGNOSIS — R52 Pain, unspecified: Secondary | ICD-10-CM | POA: Diagnosis not present

## 2020-02-12 DIAGNOSIS — I509 Heart failure, unspecified: Secondary | ICD-10-CM | POA: Diagnosis not present

## 2020-02-12 DIAGNOSIS — K5730 Diverticulosis of large intestine without perforation or abscess without bleeding: Secondary | ICD-10-CM | POA: Diagnosis not present

## 2020-02-12 DIAGNOSIS — I2721 Secondary pulmonary arterial hypertension: Secondary | ICD-10-CM | POA: Diagnosis not present

## 2020-02-12 DIAGNOSIS — E785 Hyperlipidemia, unspecified: Secondary | ICD-10-CM | POA: Diagnosis not present

## 2020-02-12 DIAGNOSIS — R0609 Other forms of dyspnea: Secondary | ICD-10-CM | POA: Diagnosis not present

## 2020-02-12 DIAGNOSIS — K5090 Crohn's disease, unspecified, without complications: Secondary | ICD-10-CM | POA: Diagnosis not present

## 2020-02-12 DIAGNOSIS — R079 Chest pain, unspecified: Secondary | ICD-10-CM | POA: Diagnosis not present

## 2020-02-12 DIAGNOSIS — Z79891 Long term (current) use of opiate analgesic: Secondary | ICD-10-CM | POA: Diagnosis not present

## 2020-02-12 DIAGNOSIS — G8929 Other chronic pain: Secondary | ICD-10-CM | POA: Diagnosis present

## 2020-02-12 DIAGNOSIS — M47816 Spondylosis without myelopathy or radiculopathy, lumbar region: Secondary | ICD-10-CM | POA: Diagnosis present

## 2020-02-12 DIAGNOSIS — K8689 Other specified diseases of pancreas: Secondary | ICD-10-CM | POA: Diagnosis not present

## 2020-02-12 DIAGNOSIS — I270 Primary pulmonary hypertension: Secondary | ICD-10-CM | POA: Diagnosis present

## 2020-02-12 DIAGNOSIS — I2724 Chronic thromboembolic pulmonary hypertension: Secondary | ICD-10-CM | POA: Diagnosis present

## 2020-02-12 DIAGNOSIS — I313 Pericardial effusion (noninflammatory): Secondary | ICD-10-CM | POA: Diagnosis not present

## 2020-02-12 DIAGNOSIS — R0789 Other chest pain: Secondary | ICD-10-CM | POA: Diagnosis not present

## 2020-02-12 DIAGNOSIS — I272 Pulmonary hypertension, unspecified: Secondary | ICD-10-CM | POA: Diagnosis not present

## 2020-02-12 DIAGNOSIS — I071 Rheumatic tricuspid insufficiency: Secondary | ICD-10-CM | POA: Diagnosis not present

## 2020-02-12 DIAGNOSIS — I110 Hypertensive heart disease with heart failure: Secondary | ICD-10-CM | POA: Diagnosis not present

## 2020-02-12 DIAGNOSIS — K861 Other chronic pancreatitis: Secondary | ICD-10-CM | POA: Diagnosis not present

## 2020-02-12 DIAGNOSIS — D751 Secondary polycythemia: Secondary | ICD-10-CM | POA: Diagnosis present

## 2020-02-12 DIAGNOSIS — R4020 Unspecified coma: Secondary | ICD-10-CM | POA: Diagnosis not present

## 2020-02-12 DIAGNOSIS — R519 Headache, unspecified: Secondary | ICD-10-CM | POA: Diagnosis not present

## 2020-02-12 DIAGNOSIS — J449 Chronic obstructive pulmonary disease, unspecified: Secondary | ICD-10-CM | POA: Diagnosis present

## 2020-02-12 DIAGNOSIS — N281 Cyst of kidney, acquired: Secondary | ICD-10-CM | POA: Diagnosis not present

## 2020-02-12 DIAGNOSIS — Z7901 Long term (current) use of anticoagulants: Secondary | ICD-10-CM | POA: Diagnosis not present

## 2020-02-12 DIAGNOSIS — G4733 Obstructive sleep apnea (adult) (pediatric): Secondary | ICD-10-CM | POA: Diagnosis present

## 2020-02-12 DIAGNOSIS — R0600 Dyspnea, unspecified: Secondary | ICD-10-CM | POA: Diagnosis not present

## 2020-02-15 DIAGNOSIS — R0600 Dyspnea, unspecified: Secondary | ICD-10-CM | POA: Diagnosis not present

## 2020-02-19 DIAGNOSIS — G4733 Obstructive sleep apnea (adult) (pediatric): Secondary | ICD-10-CM | POA: Diagnosis not present

## 2020-02-25 DIAGNOSIS — D751 Secondary polycythemia: Secondary | ICD-10-CM | POA: Diagnosis not present

## 2020-02-25 DIAGNOSIS — I2729 Other secondary pulmonary hypertension: Secondary | ICD-10-CM | POA: Diagnosis not present

## 2020-02-25 DIAGNOSIS — D759 Disease of blood and blood-forming organs, unspecified: Secondary | ICD-10-CM | POA: Diagnosis not present

## 2020-02-25 DIAGNOSIS — D6861 Antiphospholipid syndrome: Secondary | ICD-10-CM | POA: Diagnosis not present

## 2020-02-25 DIAGNOSIS — D6859 Other primary thrombophilia: Secondary | ICD-10-CM | POA: Diagnosis not present

## 2020-02-25 DIAGNOSIS — I2782 Chronic pulmonary embolism: Secondary | ICD-10-CM | POA: Diagnosis not present

## 2020-02-25 DIAGNOSIS — G4733 Obstructive sleep apnea (adult) (pediatric): Secondary | ICD-10-CM | POA: Diagnosis not present

## 2020-03-03 DIAGNOSIS — R5383 Other fatigue: Secondary | ICD-10-CM | POA: Diagnosis not present

## 2020-03-03 DIAGNOSIS — Z794 Long term (current) use of insulin: Secondary | ICD-10-CM | POA: Diagnosis not present

## 2020-03-03 DIAGNOSIS — D751 Secondary polycythemia: Secondary | ICD-10-CM | POA: Diagnosis not present

## 2020-03-03 DIAGNOSIS — I272 Pulmonary hypertension, unspecified: Secondary | ICD-10-CM | POA: Diagnosis not present

## 2020-03-03 DIAGNOSIS — D6859 Other primary thrombophilia: Secondary | ICD-10-CM | POA: Diagnosis not present

## 2020-03-03 DIAGNOSIS — R5381 Other malaise: Secondary | ICD-10-CM | POA: Diagnosis not present

## 2020-03-06 DIAGNOSIS — G4733 Obstructive sleep apnea (adult) (pediatric): Secondary | ICD-10-CM | POA: Diagnosis not present

## 2020-03-06 DIAGNOSIS — I2782 Chronic pulmonary embolism: Secondary | ICD-10-CM | POA: Diagnosis not present

## 2020-03-06 DIAGNOSIS — R5383 Other fatigue: Secondary | ICD-10-CM | POA: Diagnosis not present

## 2020-03-06 DIAGNOSIS — D751 Secondary polycythemia: Secondary | ICD-10-CM | POA: Diagnosis not present

## 2020-03-06 DIAGNOSIS — D6861 Antiphospholipid syndrome: Secondary | ICD-10-CM | POA: Diagnosis not present

## 2020-03-06 DIAGNOSIS — R5381 Other malaise: Secondary | ICD-10-CM | POA: Diagnosis not present

## 2020-03-06 DIAGNOSIS — E291 Testicular hypofunction: Secondary | ICD-10-CM | POA: Diagnosis not present

## 2020-03-06 DIAGNOSIS — Z7901 Long term (current) use of anticoagulants: Secondary | ICD-10-CM | POA: Diagnosis not present

## 2020-03-10 DIAGNOSIS — Z794 Long term (current) use of insulin: Secondary | ICD-10-CM | POA: Diagnosis not present

## 2020-03-10 DIAGNOSIS — Z7901 Long term (current) use of anticoagulants: Secondary | ICD-10-CM | POA: Diagnosis not present

## 2020-03-10 DIAGNOSIS — Z79891 Long term (current) use of opiate analgesic: Secondary | ICD-10-CM | POA: Diagnosis not present

## 2020-03-10 DIAGNOSIS — D751 Secondary polycythemia: Secondary | ICD-10-CM | POA: Diagnosis not present

## 2020-03-13 DIAGNOSIS — D6859 Other primary thrombophilia: Secondary | ICD-10-CM | POA: Diagnosis not present

## 2020-03-13 DIAGNOSIS — D751 Secondary polycythemia: Secondary | ICD-10-CM | POA: Diagnosis not present

## 2020-03-13 DIAGNOSIS — E291 Testicular hypofunction: Secondary | ICD-10-CM | POA: Diagnosis not present

## 2020-03-13 DIAGNOSIS — Z794 Long term (current) use of insulin: Secondary | ICD-10-CM | POA: Diagnosis not present

## 2020-03-13 DIAGNOSIS — G4733 Obstructive sleep apnea (adult) (pediatric): Secondary | ICD-10-CM | POA: Diagnosis not present

## 2020-03-13 DIAGNOSIS — I2782 Chronic pulmonary embolism: Secondary | ICD-10-CM | POA: Diagnosis not present

## 2020-03-18 DIAGNOSIS — Z7901 Long term (current) use of anticoagulants: Secondary | ICD-10-CM | POA: Diagnosis not present

## 2020-03-18 DIAGNOSIS — D6859 Other primary thrombophilia: Secondary | ICD-10-CM | POA: Diagnosis not present

## 2020-03-20 DIAGNOSIS — I2729 Other secondary pulmonary hypertension: Secondary | ICD-10-CM | POA: Diagnosis not present

## 2020-03-20 DIAGNOSIS — D759 Disease of blood and blood-forming organs, unspecified: Secondary | ICD-10-CM | POA: Diagnosis not present

## 2020-03-20 DIAGNOSIS — I2782 Chronic pulmonary embolism: Secondary | ICD-10-CM | POA: Diagnosis not present

## 2020-03-20 DIAGNOSIS — I272 Pulmonary hypertension, unspecified: Secondary | ICD-10-CM | POA: Diagnosis not present

## 2020-03-20 DIAGNOSIS — D751 Secondary polycythemia: Secondary | ICD-10-CM | POA: Diagnosis not present

## 2020-03-20 DIAGNOSIS — Z7901 Long term (current) use of anticoagulants: Secondary | ICD-10-CM | POA: Diagnosis not present

## 2020-03-21 DIAGNOSIS — D751 Secondary polycythemia: Secondary | ICD-10-CM | POA: Diagnosis not present

## 2020-03-21 DIAGNOSIS — M549 Dorsalgia, unspecified: Secondary | ICD-10-CM | POA: Diagnosis not present

## 2020-03-21 DIAGNOSIS — E119 Type 2 diabetes mellitus without complications: Secondary | ICD-10-CM | POA: Diagnosis not present

## 2020-03-21 DIAGNOSIS — I272 Pulmonary hypertension, unspecified: Secondary | ICD-10-CM | POA: Diagnosis not present

## 2020-03-21 DIAGNOSIS — G8929 Other chronic pain: Secondary | ICD-10-CM | POA: Diagnosis not present

## 2020-03-21 DIAGNOSIS — Z7901 Long term (current) use of anticoagulants: Secondary | ICD-10-CM | POA: Diagnosis not present

## 2020-03-21 DIAGNOSIS — E8809 Other disorders of plasma-protein metabolism, not elsewhere classified: Secondary | ICD-10-CM | POA: Diagnosis not present

## 2020-03-21 DIAGNOSIS — I270 Primary pulmonary hypertension: Secondary | ICD-10-CM | POA: Diagnosis not present

## 2020-03-21 DIAGNOSIS — Z87891 Personal history of nicotine dependence: Secondary | ICD-10-CM | POA: Diagnosis not present

## 2020-03-21 DIAGNOSIS — G4733 Obstructive sleep apnea (adult) (pediatric): Secondary | ICD-10-CM | POA: Diagnosis not present

## 2020-03-21 DIAGNOSIS — Z794 Long term (current) use of insulin: Secondary | ICD-10-CM | POA: Diagnosis not present

## 2020-03-21 DIAGNOSIS — K7581 Nonalcoholic steatohepatitis (NASH): Secondary | ICD-10-CM | POA: Diagnosis not present

## 2020-03-21 DIAGNOSIS — J449 Chronic obstructive pulmonary disease, unspecified: Secondary | ICD-10-CM | POA: Diagnosis not present

## 2020-03-21 DIAGNOSIS — M479 Spondylosis, unspecified: Secondary | ICD-10-CM | POA: Diagnosis not present

## 2020-03-21 DIAGNOSIS — I2721 Secondary pulmonary arterial hypertension: Secondary | ICD-10-CM | POA: Diagnosis not present

## 2020-03-21 DIAGNOSIS — Z7982 Long term (current) use of aspirin: Secondary | ICD-10-CM | POA: Diagnosis not present

## 2020-03-21 DIAGNOSIS — R079 Chest pain, unspecified: Secondary | ICD-10-CM | POA: Diagnosis not present

## 2020-03-21 DIAGNOSIS — Z9981 Dependence on supplemental oxygen: Secondary | ICD-10-CM | POA: Diagnosis not present

## 2020-03-21 DIAGNOSIS — Z79899 Other long term (current) drug therapy: Secondary | ICD-10-CM | POA: Diagnosis not present

## 2020-03-21 DIAGNOSIS — Z7984 Long term (current) use of oral hypoglycemic drugs: Secondary | ICD-10-CM | POA: Diagnosis not present

## 2020-03-21 DIAGNOSIS — K5090 Crohn's disease, unspecified, without complications: Secondary | ICD-10-CM | POA: Diagnosis not present

## 2020-03-21 DIAGNOSIS — R0600 Dyspnea, unspecified: Secondary | ICD-10-CM | POA: Diagnosis not present

## 2020-04-01 DIAGNOSIS — I2722 Pulmonary hypertension due to left heart disease: Secondary | ICD-10-CM | POA: Diagnosis not present

## 2020-04-01 DIAGNOSIS — N400 Enlarged prostate without lower urinary tract symptoms: Secondary | ICD-10-CM | POA: Diagnosis not present

## 2020-04-01 DIAGNOSIS — I10 Essential (primary) hypertension: Secondary | ICD-10-CM | POA: Diagnosis not present

## 2020-04-01 DIAGNOSIS — E1143 Type 2 diabetes mellitus with diabetic autonomic (poly)neuropathy: Secondary | ICD-10-CM | POA: Diagnosis not present

## 2020-04-01 DIAGNOSIS — J45909 Unspecified asthma, uncomplicated: Secondary | ICD-10-CM | POA: Diagnosis not present

## 2020-04-03 DIAGNOSIS — D500 Iron deficiency anemia secondary to blood loss (chronic): Secondary | ICD-10-CM | POA: Diagnosis not present

## 2020-04-03 DIAGNOSIS — Z794 Long term (current) use of insulin: Secondary | ICD-10-CM | POA: Diagnosis not present

## 2020-04-03 DIAGNOSIS — M5416 Radiculopathy, lumbar region: Secondary | ICD-10-CM | POA: Diagnosis not present

## 2020-04-03 DIAGNOSIS — Z7901 Long term (current) use of anticoagulants: Secondary | ICD-10-CM | POA: Diagnosis not present

## 2020-04-03 DIAGNOSIS — D751 Secondary polycythemia: Secondary | ICD-10-CM | POA: Diagnosis not present

## 2020-04-03 DIAGNOSIS — G4733 Obstructive sleep apnea (adult) (pediatric): Secondary | ICD-10-CM | POA: Diagnosis not present

## 2020-04-03 DIAGNOSIS — E291 Testicular hypofunction: Secondary | ICD-10-CM | POA: Diagnosis not present

## 2020-04-03 DIAGNOSIS — D759 Disease of blood and blood-forming organs, unspecified: Secondary | ICD-10-CM | POA: Diagnosis not present

## 2020-04-03 DIAGNOSIS — I2782 Chronic pulmonary embolism: Secondary | ICD-10-CM | POA: Diagnosis not present

## 2020-04-03 DIAGNOSIS — M542 Cervicalgia: Secondary | ICD-10-CM | POA: Diagnosis not present

## 2020-04-03 DIAGNOSIS — I2729 Other secondary pulmonary hypertension: Secondary | ICD-10-CM | POA: Diagnosis not present

## 2020-04-03 DIAGNOSIS — D6861 Antiphospholipid syndrome: Secondary | ICD-10-CM | POA: Diagnosis not present

## 2020-04-14 DIAGNOSIS — Z7901 Long term (current) use of anticoagulants: Secondary | ICD-10-CM | POA: Diagnosis not present

## 2020-04-14 DIAGNOSIS — D6859 Other primary thrombophilia: Secondary | ICD-10-CM | POA: Diagnosis not present

## 2020-04-17 DIAGNOSIS — M79641 Pain in right hand: Secondary | ICD-10-CM | POA: Diagnosis not present

## 2020-04-17 DIAGNOSIS — M65341 Trigger finger, right ring finger: Secondary | ICD-10-CM | POA: Diagnosis not present

## 2020-04-17 DIAGNOSIS — M65321 Trigger finger, right index finger: Secondary | ICD-10-CM | POA: Diagnosis not present

## 2020-04-22 DIAGNOSIS — Z7901 Long term (current) use of anticoagulants: Secondary | ICD-10-CM | POA: Diagnosis not present

## 2020-04-22 DIAGNOSIS — Z794 Long term (current) use of insulin: Secondary | ICD-10-CM | POA: Diagnosis not present

## 2020-04-29 DIAGNOSIS — D500 Iron deficiency anemia secondary to blood loss (chronic): Secondary | ICD-10-CM | POA: Diagnosis not present

## 2020-04-29 DIAGNOSIS — Z7901 Long term (current) use of anticoagulants: Secondary | ICD-10-CM | POA: Diagnosis not present

## 2020-04-29 DIAGNOSIS — D6859 Other primary thrombophilia: Secondary | ICD-10-CM | POA: Diagnosis not present

## 2020-05-06 DIAGNOSIS — D6859 Other primary thrombophilia: Secondary | ICD-10-CM | POA: Diagnosis not present

## 2020-05-06 DIAGNOSIS — Z794 Long term (current) use of insulin: Secondary | ICD-10-CM | POA: Diagnosis not present

## 2020-05-06 DIAGNOSIS — D751 Secondary polycythemia: Secondary | ICD-10-CM | POA: Diagnosis not present

## 2020-05-06 DIAGNOSIS — Z7901 Long term (current) use of anticoagulants: Secondary | ICD-10-CM | POA: Diagnosis not present

## 2020-05-22 DIAGNOSIS — I10 Essential (primary) hypertension: Secondary | ICD-10-CM | POA: Diagnosis not present

## 2020-05-22 DIAGNOSIS — I2510 Atherosclerotic heart disease of native coronary artery without angina pectoris: Secondary | ICD-10-CM | POA: Diagnosis not present

## 2020-05-22 DIAGNOSIS — E669 Obesity, unspecified: Secondary | ICD-10-CM | POA: Diagnosis not present

## 2020-05-22 DIAGNOSIS — G4733 Obstructive sleep apnea (adult) (pediatric): Secondary | ICD-10-CM | POA: Diagnosis not present

## 2020-05-22 DIAGNOSIS — D6859 Other primary thrombophilia: Secondary | ICD-10-CM | POA: Diagnosis not present

## 2020-05-22 DIAGNOSIS — I272 Pulmonary hypertension, unspecified: Secondary | ICD-10-CM | POA: Diagnosis not present

## 2020-05-22 DIAGNOSIS — J9611 Chronic respiratory failure with hypoxia: Secondary | ICD-10-CM | POA: Diagnosis not present

## 2020-05-22 DIAGNOSIS — Z6839 Body mass index (BMI) 39.0-39.9, adult: Secondary | ICD-10-CM | POA: Diagnosis not present

## 2020-06-17 DIAGNOSIS — Z794 Long term (current) use of insulin: Secondary | ICD-10-CM | POA: Diagnosis not present

## 2020-06-17 DIAGNOSIS — Z7901 Long term (current) use of anticoagulants: Secondary | ICD-10-CM | POA: Diagnosis not present

## 2020-06-17 DIAGNOSIS — I2729 Other secondary pulmonary hypertension: Secondary | ICD-10-CM | POA: Diagnosis not present

## 2020-06-17 DIAGNOSIS — D759 Disease of blood and blood-forming organs, unspecified: Secondary | ICD-10-CM | POA: Diagnosis not present

## 2020-06-17 DIAGNOSIS — D6861 Antiphospholipid syndrome: Secondary | ICD-10-CM | POA: Diagnosis not present

## 2020-06-17 DIAGNOSIS — D500 Iron deficiency anemia secondary to blood loss (chronic): Secondary | ICD-10-CM | POA: Diagnosis not present

## 2020-06-17 DIAGNOSIS — D751 Secondary polycythemia: Secondary | ICD-10-CM | POA: Diagnosis not present

## 2020-06-17 DIAGNOSIS — Z79891 Long term (current) use of opiate analgesic: Secondary | ICD-10-CM | POA: Diagnosis not present

## 2020-06-20 DIAGNOSIS — E291 Testicular hypofunction: Secondary | ICD-10-CM | POA: Diagnosis not present

## 2020-06-20 DIAGNOSIS — Z87891 Personal history of nicotine dependence: Secondary | ICD-10-CM | POA: Diagnosis not present

## 2020-06-20 DIAGNOSIS — E119 Type 2 diabetes mellitus without complications: Secondary | ICD-10-CM | POA: Diagnosis not present

## 2020-06-20 DIAGNOSIS — G8929 Other chronic pain: Secondary | ICD-10-CM | POA: Diagnosis not present

## 2020-06-20 DIAGNOSIS — G4733 Obstructive sleep apnea (adult) (pediatric): Secondary | ICD-10-CM | POA: Diagnosis not present

## 2020-06-20 DIAGNOSIS — D751 Secondary polycythemia: Secondary | ICD-10-CM | POA: Diagnosis not present

## 2020-06-20 DIAGNOSIS — M549 Dorsalgia, unspecified: Secondary | ICD-10-CM | POA: Diagnosis not present

## 2020-06-20 DIAGNOSIS — Z79899 Other long term (current) drug therapy: Secondary | ICD-10-CM | POA: Diagnosis not present

## 2020-06-20 DIAGNOSIS — I272 Pulmonary hypertension, unspecified: Secondary | ICD-10-CM | POA: Diagnosis not present

## 2020-06-20 DIAGNOSIS — R0600 Dyspnea, unspecified: Secondary | ICD-10-CM | POA: Diagnosis not present

## 2020-06-20 DIAGNOSIS — R911 Solitary pulmonary nodule: Secondary | ICD-10-CM | POA: Diagnosis not present

## 2020-06-20 DIAGNOSIS — Z7951 Long term (current) use of inhaled steroids: Secondary | ICD-10-CM | POA: Diagnosis not present

## 2020-06-20 DIAGNOSIS — K5010 Crohn's disease of large intestine without complications: Secondary | ICD-10-CM | POA: Diagnosis not present

## 2020-07-01 DIAGNOSIS — Z7901 Long term (current) use of anticoagulants: Secondary | ICD-10-CM | POA: Diagnosis not present

## 2020-07-01 DIAGNOSIS — E1143 Type 2 diabetes mellitus with diabetic autonomic (poly)neuropathy: Secondary | ICD-10-CM | POA: Diagnosis not present

## 2020-07-01 DIAGNOSIS — Z794 Long term (current) use of insulin: Secondary | ICD-10-CM | POA: Diagnosis not present

## 2020-07-01 DIAGNOSIS — J45909 Unspecified asthma, uncomplicated: Secondary | ICD-10-CM | POA: Diagnosis not present

## 2020-07-01 DIAGNOSIS — I2782 Chronic pulmonary embolism: Secondary | ICD-10-CM | POA: Diagnosis not present

## 2020-07-01 DIAGNOSIS — E162 Hypoglycemia, unspecified: Secondary | ICD-10-CM | POA: Diagnosis not present

## 2020-07-01 DIAGNOSIS — I10 Essential (primary) hypertension: Secondary | ICD-10-CM | POA: Diagnosis not present

## 2020-07-01 DIAGNOSIS — I2722 Pulmonary hypertension due to left heart disease: Secondary | ICD-10-CM | POA: Diagnosis not present

## 2020-07-01 DIAGNOSIS — N400 Enlarged prostate without lower urinary tract symptoms: Secondary | ICD-10-CM | POA: Diagnosis not present

## 2020-07-15 DIAGNOSIS — I272 Pulmonary hypertension, unspecified: Secondary | ICD-10-CM | POA: Diagnosis not present

## 2020-07-15 DIAGNOSIS — Z7901 Long term (current) use of anticoagulants: Secondary | ICD-10-CM | POA: Diagnosis not present

## 2020-07-15 DIAGNOSIS — D751 Secondary polycythemia: Secondary | ICD-10-CM | POA: Diagnosis not present

## 2020-07-15 DIAGNOSIS — D6859 Other primary thrombophilia: Secondary | ICD-10-CM | POA: Diagnosis not present

## 2020-07-15 DIAGNOSIS — G4733 Obstructive sleep apnea (adult) (pediatric): Secondary | ICD-10-CM | POA: Diagnosis not present

## 2020-07-15 DIAGNOSIS — D500 Iron deficiency anemia secondary to blood loss (chronic): Secondary | ICD-10-CM | POA: Diagnosis not present

## 2020-07-15 DIAGNOSIS — E291 Testicular hypofunction: Secondary | ICD-10-CM | POA: Diagnosis not present

## 2020-08-12 DIAGNOSIS — M5136 Other intervertebral disc degeneration, lumbar region: Secondary | ICD-10-CM | POA: Diagnosis not present

## 2020-08-12 DIAGNOSIS — M5134 Other intervertebral disc degeneration, thoracic region: Secondary | ICD-10-CM | POA: Diagnosis not present

## 2020-08-12 DIAGNOSIS — M5030 Other cervical disc degeneration, unspecified cervical region: Secondary | ICD-10-CM | POA: Diagnosis not present

## 2020-08-12 DIAGNOSIS — Z79891 Long term (current) use of opiate analgesic: Secondary | ICD-10-CM | POA: Diagnosis not present

## 2020-08-12 DIAGNOSIS — Z79899 Other long term (current) drug therapy: Secondary | ICD-10-CM | POA: Diagnosis not present

## 2020-08-19 DIAGNOSIS — D751 Secondary polycythemia: Secondary | ICD-10-CM | POA: Diagnosis not present

## 2020-08-19 DIAGNOSIS — Z794 Long term (current) use of insulin: Secondary | ICD-10-CM | POA: Diagnosis not present

## 2020-08-19 DIAGNOSIS — D6859 Other primary thrombophilia: Secondary | ICD-10-CM | POA: Diagnosis not present

## 2020-08-19 DIAGNOSIS — G4733 Obstructive sleep apnea (adult) (pediatric): Secondary | ICD-10-CM | POA: Diagnosis not present

## 2020-08-19 DIAGNOSIS — I272 Pulmonary hypertension, unspecified: Secondary | ICD-10-CM | POA: Diagnosis not present

## 2020-08-19 DIAGNOSIS — R5381 Other malaise: Secondary | ICD-10-CM | POA: Diagnosis not present

## 2020-08-19 DIAGNOSIS — D759 Disease of blood and blood-forming organs, unspecified: Secondary | ICD-10-CM | POA: Diagnosis not present

## 2020-08-19 DIAGNOSIS — R5383 Other fatigue: Secondary | ICD-10-CM | POA: Diagnosis not present

## 2020-08-19 DIAGNOSIS — Z7901 Long term (current) use of anticoagulants: Secondary | ICD-10-CM | POA: Diagnosis not present

## 2020-08-19 DIAGNOSIS — I2729 Other secondary pulmonary hypertension: Secondary | ICD-10-CM | POA: Diagnosis not present

## 2020-08-19 DIAGNOSIS — K861 Other chronic pancreatitis: Secondary | ICD-10-CM | POA: Diagnosis not present

## 2020-08-19 DIAGNOSIS — E291 Testicular hypofunction: Secondary | ICD-10-CM | POA: Diagnosis not present

## 2020-08-26 DIAGNOSIS — I2510 Atherosclerotic heart disease of native coronary artery without angina pectoris: Secondary | ICD-10-CM | POA: Diagnosis not present

## 2020-08-26 DIAGNOSIS — I10 Essential (primary) hypertension: Secondary | ICD-10-CM | POA: Diagnosis not present

## 2020-08-26 DIAGNOSIS — I272 Pulmonary hypertension, unspecified: Secondary | ICD-10-CM | POA: Diagnosis not present

## 2020-08-26 DIAGNOSIS — G4733 Obstructive sleep apnea (adult) (pediatric): Secondary | ICD-10-CM | POA: Diagnosis not present

## 2020-09-11 DIAGNOSIS — Z23 Encounter for immunization: Secondary | ICD-10-CM | POA: Diagnosis not present

## 2020-09-23 DIAGNOSIS — G4733 Obstructive sleep apnea (adult) (pediatric): Secondary | ICD-10-CM | POA: Diagnosis not present

## 2020-09-23 DIAGNOSIS — Z87891 Personal history of nicotine dependence: Secondary | ICD-10-CM | POA: Diagnosis not present

## 2020-09-23 DIAGNOSIS — Z9981 Dependence on supplemental oxygen: Secondary | ICD-10-CM | POA: Diagnosis not present

## 2020-09-23 DIAGNOSIS — G479 Sleep disorder, unspecified: Secondary | ICD-10-CM | POA: Diagnosis not present

## 2020-09-23 DIAGNOSIS — I2510 Atherosclerotic heart disease of native coronary artery without angina pectoris: Secondary | ICD-10-CM | POA: Diagnosis not present

## 2020-09-23 DIAGNOSIS — K861 Other chronic pancreatitis: Secondary | ICD-10-CM | POA: Diagnosis not present

## 2020-09-23 DIAGNOSIS — I272 Pulmonary hypertension, unspecified: Secondary | ICD-10-CM | POA: Diagnosis not present

## 2020-09-23 DIAGNOSIS — E119 Type 2 diabetes mellitus without complications: Secondary | ICD-10-CM | POA: Diagnosis not present

## 2020-09-23 DIAGNOSIS — G4719 Other hypersomnia: Secondary | ICD-10-CM | POA: Diagnosis not present

## 2020-09-23 DIAGNOSIS — Z794 Long term (current) use of insulin: Secondary | ICD-10-CM | POA: Diagnosis not present

## 2020-09-23 DIAGNOSIS — J9611 Chronic respiratory failure with hypoxia: Secondary | ICD-10-CM | POA: Diagnosis not present

## 2020-09-23 DIAGNOSIS — R918 Other nonspecific abnormal finding of lung field: Secondary | ICD-10-CM | POA: Diagnosis not present

## 2020-09-23 DIAGNOSIS — R911 Solitary pulmonary nodule: Secondary | ICD-10-CM | POA: Diagnosis not present

## 2020-09-23 DIAGNOSIS — D6861 Antiphospholipid syndrome: Secondary | ICD-10-CM | POA: Diagnosis not present

## 2020-09-23 DIAGNOSIS — I10 Essential (primary) hypertension: Secondary | ICD-10-CM | POA: Diagnosis not present

## 2020-09-23 DIAGNOSIS — Z7982 Long term (current) use of aspirin: Secondary | ICD-10-CM | POA: Diagnosis not present

## 2020-09-30 DIAGNOSIS — Z9989 Dependence on other enabling machines and devices: Secondary | ICD-10-CM | POA: Diagnosis not present

## 2020-09-30 DIAGNOSIS — Z79891 Long term (current) use of opiate analgesic: Secondary | ICD-10-CM | POA: Diagnosis not present

## 2020-09-30 DIAGNOSIS — Z6841 Body Mass Index (BMI) 40.0 and over, adult: Secondary | ICD-10-CM | POA: Diagnosis not present

## 2020-09-30 DIAGNOSIS — I10 Essential (primary) hypertension: Secondary | ICD-10-CM | POA: Diagnosis not present

## 2020-09-30 DIAGNOSIS — I2699 Other pulmonary embolism without acute cor pulmonale: Secondary | ICD-10-CM | POA: Diagnosis not present

## 2020-09-30 DIAGNOSIS — E119 Type 2 diabetes mellitus without complications: Secondary | ICD-10-CM | POA: Diagnosis not present

## 2020-09-30 DIAGNOSIS — G4733 Obstructive sleep apnea (adult) (pediatric): Secondary | ICD-10-CM | POA: Diagnosis not present

## 2020-09-30 DIAGNOSIS — E669 Obesity, unspecified: Secondary | ICD-10-CM | POA: Diagnosis not present

## 2020-09-30 DIAGNOSIS — Z794 Long term (current) use of insulin: Secondary | ICD-10-CM | POA: Diagnosis not present

## 2020-10-02 DIAGNOSIS — E1143 Type 2 diabetes mellitus with diabetic autonomic (poly)neuropathy: Secondary | ICD-10-CM | POA: Diagnosis not present

## 2020-10-02 DIAGNOSIS — I2722 Pulmonary hypertension due to left heart disease: Secondary | ICD-10-CM | POA: Diagnosis not present

## 2020-10-02 DIAGNOSIS — N400 Enlarged prostate without lower urinary tract symptoms: Secondary | ICD-10-CM | POA: Diagnosis not present

## 2020-10-02 DIAGNOSIS — I10 Essential (primary) hypertension: Secondary | ICD-10-CM | POA: Diagnosis not present

## 2020-10-02 DIAGNOSIS — J45909 Unspecified asthma, uncomplicated: Secondary | ICD-10-CM | POA: Diagnosis not present

## 2020-10-02 DIAGNOSIS — E162 Hypoglycemia, unspecified: Secondary | ICD-10-CM | POA: Diagnosis not present

## 2020-10-09 DIAGNOSIS — B372 Candidiasis of skin and nail: Secondary | ICD-10-CM | POA: Diagnosis not present

## 2020-10-10 DIAGNOSIS — M1990 Unspecified osteoarthritis, unspecified site: Secondary | ICD-10-CM | POA: Diagnosis not present

## 2020-10-10 DIAGNOSIS — R0600 Dyspnea, unspecified: Secondary | ICD-10-CM | POA: Diagnosis not present

## 2020-10-10 DIAGNOSIS — E119 Type 2 diabetes mellitus without complications: Secondary | ICD-10-CM | POA: Diagnosis not present

## 2020-10-10 DIAGNOSIS — I2510 Atherosclerotic heart disease of native coronary artery without angina pectoris: Secondary | ICD-10-CM | POA: Diagnosis not present

## 2020-10-10 DIAGNOSIS — Z7951 Long term (current) use of inhaled steroids: Secondary | ICD-10-CM | POA: Diagnosis not present

## 2020-10-10 DIAGNOSIS — G4733 Obstructive sleep apnea (adult) (pediatric): Secondary | ICD-10-CM | POA: Diagnosis not present

## 2020-10-10 DIAGNOSIS — I272 Pulmonary hypertension, unspecified: Secondary | ICD-10-CM | POA: Diagnosis not present

## 2020-10-10 DIAGNOSIS — Z87891 Personal history of nicotine dependence: Secondary | ICD-10-CM | POA: Diagnosis not present

## 2020-10-10 DIAGNOSIS — K909 Intestinal malabsorption, unspecified: Secondary | ICD-10-CM | POA: Diagnosis not present

## 2020-10-10 DIAGNOSIS — I10 Essential (primary) hypertension: Secondary | ICD-10-CM | POA: Diagnosis not present

## 2020-10-10 DIAGNOSIS — Z7901 Long term (current) use of anticoagulants: Secondary | ICD-10-CM | POA: Diagnosis not present

## 2020-10-10 DIAGNOSIS — Z79891 Long term (current) use of opiate analgesic: Secondary | ICD-10-CM | POA: Diagnosis not present

## 2020-10-10 DIAGNOSIS — R079 Chest pain, unspecified: Secondary | ICD-10-CM | POA: Diagnosis not present

## 2020-10-10 DIAGNOSIS — Z794 Long term (current) use of insulin: Secondary | ICD-10-CM | POA: Diagnosis not present

## 2020-10-10 DIAGNOSIS — E860 Dehydration: Secondary | ICD-10-CM | POA: Diagnosis not present

## 2020-10-10 DIAGNOSIS — J449 Chronic obstructive pulmonary disease, unspecified: Secondary | ICD-10-CM | POA: Diagnosis not present

## 2020-10-10 DIAGNOSIS — Z7982 Long term (current) use of aspirin: Secondary | ICD-10-CM | POA: Diagnosis not present

## 2020-10-10 DIAGNOSIS — D751 Secondary polycythemia: Secondary | ICD-10-CM | POA: Diagnosis not present

## 2020-10-28 DIAGNOSIS — Z7901 Long term (current) use of anticoagulants: Secondary | ICD-10-CM | POA: Diagnosis not present

## 2020-10-28 DIAGNOSIS — R5383 Other fatigue: Secondary | ICD-10-CM | POA: Diagnosis not present

## 2020-10-28 DIAGNOSIS — E291 Testicular hypofunction: Secondary | ICD-10-CM | POA: Diagnosis not present

## 2020-10-28 DIAGNOSIS — I272 Pulmonary hypertension, unspecified: Secondary | ICD-10-CM | POA: Diagnosis not present

## 2020-10-28 DIAGNOSIS — I2699 Other pulmonary embolism without acute cor pulmonale: Secondary | ICD-10-CM | POA: Diagnosis not present

## 2020-10-28 DIAGNOSIS — R5381 Other malaise: Secondary | ICD-10-CM | POA: Diagnosis not present

## 2020-10-28 DIAGNOSIS — D751 Secondary polycythemia: Secondary | ICD-10-CM | POA: Diagnosis not present

## 2020-10-30 DIAGNOSIS — D6859 Other primary thrombophilia: Secondary | ICD-10-CM | POA: Diagnosis not present

## 2020-10-30 DIAGNOSIS — Z9989 Dependence on other enabling machines and devices: Secondary | ICD-10-CM | POA: Diagnosis not present

## 2020-10-30 DIAGNOSIS — I50812 Chronic right heart failure: Secondary | ICD-10-CM | POA: Diagnosis not present

## 2020-10-30 DIAGNOSIS — D751 Secondary polycythemia: Secondary | ICD-10-CM | POA: Diagnosis not present

## 2020-10-30 DIAGNOSIS — Z7189 Other specified counseling: Secondary | ICD-10-CM | POA: Diagnosis not present

## 2020-10-30 DIAGNOSIS — I272 Pulmonary hypertension, unspecified: Secondary | ICD-10-CM | POA: Diagnosis not present

## 2020-10-30 DIAGNOSIS — Z7901 Long term (current) use of anticoagulants: Secondary | ICD-10-CM | POA: Diagnosis not present

## 2020-11-04 DIAGNOSIS — L03311 Cellulitis of abdominal wall: Secondary | ICD-10-CM | POA: Diagnosis not present

## 2020-12-02 DIAGNOSIS — I2510 Atherosclerotic heart disease of native coronary artery without angina pectoris: Secondary | ICD-10-CM | POA: Diagnosis not present

## 2020-12-02 DIAGNOSIS — I272 Pulmonary hypertension, unspecified: Secondary | ICD-10-CM | POA: Diagnosis not present

## 2020-12-09 DIAGNOSIS — M533 Sacrococcygeal disorders, not elsewhere classified: Secondary | ICD-10-CM | POA: Diagnosis not present

## 2020-12-13 DIAGNOSIS — M533 Sacrococcygeal disorders, not elsewhere classified: Secondary | ICD-10-CM | POA: Diagnosis not present

## 2020-12-22 DIAGNOSIS — I272 Pulmonary hypertension, unspecified: Secondary | ICD-10-CM | POA: Diagnosis not present

## 2020-12-22 DIAGNOSIS — E291 Testicular hypofunction: Secondary | ICD-10-CM | POA: Diagnosis not present

## 2021-01-01 DIAGNOSIS — Z125 Encounter for screening for malignant neoplasm of prostate: Secondary | ICD-10-CM | POA: Diagnosis not present

## 2021-01-01 DIAGNOSIS — E1143 Type 2 diabetes mellitus with diabetic autonomic (poly)neuropathy: Secondary | ICD-10-CM | POA: Diagnosis not present

## 2021-01-01 DIAGNOSIS — Z0000 Encounter for general adult medical examination without abnormal findings: Secondary | ICD-10-CM | POA: Diagnosis not present

## 2021-01-01 DIAGNOSIS — R197 Diarrhea, unspecified: Secondary | ICD-10-CM | POA: Diagnosis not present

## 2021-01-01 DIAGNOSIS — E7849 Other hyperlipidemia: Secondary | ICD-10-CM | POA: Diagnosis not present

## 2021-01-01 DIAGNOSIS — I10 Essential (primary) hypertension: Secondary | ICD-10-CM | POA: Diagnosis not present

## 2021-01-01 DIAGNOSIS — Z1331 Encounter for screening for depression: Secondary | ICD-10-CM | POA: Diagnosis not present

## 2021-01-08 DIAGNOSIS — E291 Testicular hypofunction: Secondary | ICD-10-CM | POA: Diagnosis not present

## 2021-01-08 DIAGNOSIS — R0600 Dyspnea, unspecified: Secondary | ICD-10-CM | POA: Diagnosis not present

## 2021-01-09 DIAGNOSIS — R0600 Dyspnea, unspecified: Secondary | ICD-10-CM | POA: Diagnosis not present

## 2021-03-03 DIAGNOSIS — I2510 Atherosclerotic heart disease of native coronary artery without angina pectoris: Secondary | ICD-10-CM | POA: Diagnosis not present

## 2021-03-03 DIAGNOSIS — I272 Pulmonary hypertension, unspecified: Secondary | ICD-10-CM | POA: Diagnosis not present

## 2021-03-03 DIAGNOSIS — Z7901 Long term (current) use of anticoagulants: Secondary | ICD-10-CM | POA: Diagnosis not present

## 2021-03-05 DIAGNOSIS — Z7984 Long term (current) use of oral hypoglycemic drugs: Secondary | ICD-10-CM | POA: Diagnosis not present

## 2021-03-05 DIAGNOSIS — Z794 Long term (current) use of insulin: Secondary | ICD-10-CM | POA: Diagnosis not present

## 2021-03-05 DIAGNOSIS — Z961 Presence of intraocular lens: Secondary | ICD-10-CM | POA: Diagnosis not present

## 2021-03-05 DIAGNOSIS — E119 Type 2 diabetes mellitus without complications: Secondary | ICD-10-CM | POA: Diagnosis not present

## 2021-03-17 DIAGNOSIS — E1169 Type 2 diabetes mellitus with other specified complication: Secondary | ICD-10-CM | POA: Diagnosis not present

## 2021-03-17 DIAGNOSIS — E291 Testicular hypofunction: Secondary | ICD-10-CM | POA: Diagnosis not present

## 2021-03-17 DIAGNOSIS — Z794 Long term (current) use of insulin: Secondary | ICD-10-CM | POA: Diagnosis not present

## 2021-03-17 DIAGNOSIS — I272 Pulmonary hypertension, unspecified: Secondary | ICD-10-CM | POA: Diagnosis not present

## 2021-04-02 DIAGNOSIS — Z0000 Encounter for general adult medical examination without abnormal findings: Secondary | ICD-10-CM | POA: Diagnosis not present

## 2021-04-02 DIAGNOSIS — Z1331 Encounter for screening for depression: Secondary | ICD-10-CM | POA: Diagnosis not present

## 2021-04-02 DIAGNOSIS — I10 Essential (primary) hypertension: Secondary | ICD-10-CM | POA: Diagnosis not present

## 2021-04-02 DIAGNOSIS — E1143 Type 2 diabetes mellitus with diabetic autonomic (poly)neuropathy: Secondary | ICD-10-CM | POA: Diagnosis not present

## 2021-04-02 DIAGNOSIS — E7849 Other hyperlipidemia: Secondary | ICD-10-CM | POA: Diagnosis not present

## 2021-04-02 DIAGNOSIS — R197 Diarrhea, unspecified: Secondary | ICD-10-CM | POA: Diagnosis not present

## 2021-04-03 DIAGNOSIS — Z7901 Long term (current) use of anticoagulants: Secondary | ICD-10-CM | POA: Diagnosis not present

## 2021-04-03 DIAGNOSIS — R079 Chest pain, unspecified: Secondary | ICD-10-CM | POA: Diagnosis not present

## 2021-04-03 DIAGNOSIS — Z7982 Long term (current) use of aspirin: Secondary | ICD-10-CM | POA: Diagnosis not present

## 2021-04-03 DIAGNOSIS — E860 Dehydration: Secondary | ICD-10-CM | POA: Diagnosis not present

## 2021-04-03 DIAGNOSIS — I10 Essential (primary) hypertension: Secondary | ICD-10-CM | POA: Diagnosis not present

## 2021-04-03 DIAGNOSIS — M549 Dorsalgia, unspecified: Secondary | ICD-10-CM | POA: Diagnosis not present

## 2021-04-03 DIAGNOSIS — Z87891 Personal history of nicotine dependence: Secondary | ICD-10-CM | POA: Diagnosis not present

## 2021-04-03 DIAGNOSIS — R519 Headache, unspecified: Secondary | ICD-10-CM | POA: Diagnosis not present

## 2021-04-03 DIAGNOSIS — K50919 Crohn's disease, unspecified, with unspecified complications: Secondary | ICD-10-CM | POA: Diagnosis not present

## 2021-04-03 DIAGNOSIS — E119 Type 2 diabetes mellitus without complications: Secondary | ICD-10-CM | POA: Diagnosis not present

## 2021-04-03 DIAGNOSIS — I272 Pulmonary hypertension, unspecified: Secondary | ICD-10-CM | POA: Diagnosis not present

## 2021-04-03 DIAGNOSIS — D751 Secondary polycythemia: Secondary | ICD-10-CM | POA: Diagnosis not present

## 2021-04-03 DIAGNOSIS — Z7951 Long term (current) use of inhaled steroids: Secondary | ICD-10-CM | POA: Diagnosis not present

## 2021-04-03 DIAGNOSIS — I2510 Atherosclerotic heart disease of native coronary artery without angina pectoris: Secondary | ICD-10-CM | POA: Diagnosis not present

## 2021-04-03 DIAGNOSIS — R0600 Dyspnea, unspecified: Secondary | ICD-10-CM | POA: Diagnosis not present

## 2021-04-03 DIAGNOSIS — R760 Raised antibody titer: Secondary | ICD-10-CM | POA: Diagnosis not present

## 2021-04-03 DIAGNOSIS — K909 Intestinal malabsorption, unspecified: Secondary | ICD-10-CM | POA: Diagnosis not present

## 2021-04-03 DIAGNOSIS — Z794 Long term (current) use of insulin: Secondary | ICD-10-CM | POA: Diagnosis not present

## 2021-04-03 DIAGNOSIS — M1990 Unspecified osteoarthritis, unspecified site: Secondary | ICD-10-CM | POA: Diagnosis not present

## 2021-04-03 DIAGNOSIS — J449 Chronic obstructive pulmonary disease, unspecified: Secondary | ICD-10-CM | POA: Diagnosis not present

## 2021-04-07 DIAGNOSIS — M961 Postlaminectomy syndrome, not elsewhere classified: Secondary | ICD-10-CM | POA: Diagnosis not present

## 2021-04-14 DIAGNOSIS — D751 Secondary polycythemia: Secondary | ICD-10-CM | POA: Diagnosis not present

## 2021-04-14 DIAGNOSIS — Z7901 Long term (current) use of anticoagulants: Secondary | ICD-10-CM | POA: Diagnosis not present

## 2021-04-14 DIAGNOSIS — I272 Pulmonary hypertension, unspecified: Secondary | ICD-10-CM | POA: Diagnosis not present

## 2021-04-30 DIAGNOSIS — M5416 Radiculopathy, lumbar region: Secondary | ICD-10-CM | POA: Diagnosis not present

## 2021-06-26 DIAGNOSIS — I272 Pulmonary hypertension, unspecified: Secondary | ICD-10-CM | POA: Diagnosis not present

## 2021-06-26 DIAGNOSIS — Z7901 Long term (current) use of anticoagulants: Secondary | ICD-10-CM | POA: Diagnosis not present

## 2021-06-26 DIAGNOSIS — D751 Secondary polycythemia: Secondary | ICD-10-CM | POA: Diagnosis not present

## 2021-07-02 DIAGNOSIS — R197 Diarrhea, unspecified: Secondary | ICD-10-CM | POA: Diagnosis not present

## 2021-07-02 DIAGNOSIS — I10 Essential (primary) hypertension: Secondary | ICD-10-CM | POA: Diagnosis not present

## 2021-07-02 DIAGNOSIS — E1143 Type 2 diabetes mellitus with diabetic autonomic (poly)neuropathy: Secondary | ICD-10-CM | POA: Diagnosis not present

## 2021-07-02 DIAGNOSIS — E7849 Other hyperlipidemia: Secondary | ICD-10-CM | POA: Diagnosis not present

## 2021-07-02 DIAGNOSIS — Z7901 Long term (current) use of anticoagulants: Secondary | ICD-10-CM | POA: Diagnosis not present

## 2021-07-14 DIAGNOSIS — Z7901 Long term (current) use of anticoagulants: Secondary | ICD-10-CM | POA: Diagnosis not present

## 2021-07-14 DIAGNOSIS — I272 Pulmonary hypertension, unspecified: Secondary | ICD-10-CM | POA: Diagnosis not present

## 2021-07-23 DIAGNOSIS — Z7901 Long term (current) use of anticoagulants: Secondary | ICD-10-CM | POA: Diagnosis not present

## 2021-08-04 DIAGNOSIS — E291 Testicular hypofunction: Secondary | ICD-10-CM | POA: Diagnosis not present

## 2021-08-04 DIAGNOSIS — Z125 Encounter for screening for malignant neoplasm of prostate: Secondary | ICD-10-CM | POA: Diagnosis not present

## 2021-08-04 DIAGNOSIS — Z794 Long term (current) use of insulin: Secondary | ICD-10-CM | POA: Diagnosis not present

## 2021-08-04 DIAGNOSIS — E1169 Type 2 diabetes mellitus with other specified complication: Secondary | ICD-10-CM | POA: Diagnosis not present

## 2021-08-04 DIAGNOSIS — E230 Hypopituitarism: Secondary | ICD-10-CM | POA: Diagnosis not present

## 2021-08-13 DIAGNOSIS — Z7901 Long term (current) use of anticoagulants: Secondary | ICD-10-CM | POA: Diagnosis not present

## 2021-08-13 DIAGNOSIS — I2729 Other secondary pulmonary hypertension: Secondary | ICD-10-CM | POA: Diagnosis not present

## 2021-08-13 DIAGNOSIS — I272 Pulmonary hypertension, unspecified: Secondary | ICD-10-CM | POA: Diagnosis not present

## 2021-08-13 DIAGNOSIS — D6861 Antiphospholipid syndrome: Secondary | ICD-10-CM | POA: Diagnosis not present

## 2021-08-13 DIAGNOSIS — D751 Secondary polycythemia: Secondary | ICD-10-CM | POA: Diagnosis not present

## 2021-08-13 DIAGNOSIS — D759 Disease of blood and blood-forming organs, unspecified: Secondary | ICD-10-CM | POA: Diagnosis not present

## 2021-08-13 DIAGNOSIS — Z794 Long term (current) use of insulin: Secondary | ICD-10-CM | POA: Diagnosis not present

## 2021-08-13 DIAGNOSIS — R799 Abnormal finding of blood chemistry, unspecified: Secondary | ICD-10-CM | POA: Diagnosis not present

## 2021-08-19 DIAGNOSIS — R0609 Other forms of dyspnea: Secondary | ICD-10-CM | POA: Diagnosis not present

## 2021-08-19 DIAGNOSIS — G4733 Obstructive sleep apnea (adult) (pediatric): Secondary | ICD-10-CM | POA: Diagnosis not present

## 2021-08-19 DIAGNOSIS — Z7984 Long term (current) use of oral hypoglycemic drugs: Secondary | ICD-10-CM | POA: Diagnosis not present

## 2021-08-19 DIAGNOSIS — J449 Chronic obstructive pulmonary disease, unspecified: Secondary | ICD-10-CM | POA: Diagnosis not present

## 2021-08-19 DIAGNOSIS — E119 Type 2 diabetes mellitus without complications: Secondary | ICD-10-CM | POA: Diagnosis not present

## 2021-08-19 DIAGNOSIS — I272 Pulmonary hypertension, unspecified: Secondary | ICD-10-CM | POA: Diagnosis not present

## 2021-08-19 DIAGNOSIS — R0602 Shortness of breath: Secondary | ICD-10-CM | POA: Diagnosis not present

## 2021-08-19 DIAGNOSIS — Z9989 Dependence on other enabling machines and devices: Secondary | ICD-10-CM | POA: Diagnosis not present

## 2021-08-19 DIAGNOSIS — Z79899 Other long term (current) drug therapy: Secondary | ICD-10-CM | POA: Diagnosis not present

## 2021-08-19 DIAGNOSIS — Z9981 Dependence on supplemental oxygen: Secondary | ICD-10-CM | POA: Diagnosis not present

## 2021-08-19 DIAGNOSIS — R0600 Dyspnea, unspecified: Secondary | ICD-10-CM | POA: Diagnosis not present

## 2021-08-19 DIAGNOSIS — G8929 Other chronic pain: Secondary | ICD-10-CM | POA: Diagnosis not present

## 2021-08-19 DIAGNOSIS — Z794 Long term (current) use of insulin: Secondary | ICD-10-CM | POA: Diagnosis not present

## 2021-08-19 DIAGNOSIS — I10 Essential (primary) hypertension: Secondary | ICD-10-CM | POA: Diagnosis not present

## 2021-08-19 DIAGNOSIS — Z7951 Long term (current) use of inhaled steroids: Secondary | ICD-10-CM | POA: Diagnosis not present

## 2021-08-19 DIAGNOSIS — Z7901 Long term (current) use of anticoagulants: Secondary | ICD-10-CM | POA: Diagnosis not present

## 2021-08-19 DIAGNOSIS — D6861 Antiphospholipid syndrome: Secondary | ICD-10-CM | POA: Diagnosis not present

## 2021-08-19 DIAGNOSIS — Z87891 Personal history of nicotine dependence: Secondary | ICD-10-CM | POA: Diagnosis not present

## 2021-08-19 DIAGNOSIS — Z7982 Long term (current) use of aspirin: Secondary | ICD-10-CM | POA: Diagnosis not present

## 2021-08-19 DIAGNOSIS — D751 Secondary polycythemia: Secondary | ICD-10-CM | POA: Diagnosis not present

## 2021-08-19 DIAGNOSIS — M549 Dorsalgia, unspecified: Secondary | ICD-10-CM | POA: Diagnosis not present

## 2021-08-19 DIAGNOSIS — I2510 Atherosclerotic heart disease of native coronary artery without angina pectoris: Secondary | ICD-10-CM | POA: Diagnosis not present

## 2021-08-19 DIAGNOSIS — Z79890 Hormone replacement therapy (postmenopausal): Secondary | ICD-10-CM | POA: Diagnosis not present

## 2021-08-19 DIAGNOSIS — E291 Testicular hypofunction: Secondary | ICD-10-CM | POA: Diagnosis not present

## 2021-08-25 DIAGNOSIS — M5412 Radiculopathy, cervical region: Secondary | ICD-10-CM | POA: Diagnosis not present

## 2021-08-25 DIAGNOSIS — M961 Postlaminectomy syndrome, not elsewhere classified: Secondary | ICD-10-CM | POA: Diagnosis not present

## 2021-09-03 DIAGNOSIS — D751 Secondary polycythemia: Secondary | ICD-10-CM | POA: Diagnosis not present

## 2021-09-03 DIAGNOSIS — Z7901 Long term (current) use of anticoagulants: Secondary | ICD-10-CM | POA: Diagnosis not present

## 2021-09-03 DIAGNOSIS — Z23 Encounter for immunization: Secondary | ICD-10-CM | POA: Diagnosis not present

## 2021-09-10 DIAGNOSIS — M5412 Radiculopathy, cervical region: Secondary | ICD-10-CM | POA: Diagnosis not present

## 2021-09-18 DIAGNOSIS — I270 Primary pulmonary hypertension: Secondary | ICD-10-CM | POA: Diagnosis not present

## 2021-09-18 DIAGNOSIS — G629 Polyneuropathy, unspecified: Secondary | ICD-10-CM | POA: Diagnosis not present

## 2021-09-18 DIAGNOSIS — Z7901 Long term (current) use of anticoagulants: Secondary | ICD-10-CM | POA: Diagnosis not present

## 2021-09-18 DIAGNOSIS — E669 Obesity, unspecified: Secondary | ICD-10-CM | POA: Diagnosis not present

## 2021-09-18 DIAGNOSIS — Z6839 Body mass index (BMI) 39.0-39.9, adult: Secondary | ICD-10-CM | POA: Diagnosis not present

## 2021-09-18 DIAGNOSIS — R0609 Other forms of dyspnea: Secondary | ICD-10-CM | POA: Diagnosis not present

## 2021-09-18 DIAGNOSIS — I371 Nonrheumatic pulmonary valve insufficiency: Secondary | ICD-10-CM | POA: Diagnosis not present

## 2021-09-18 DIAGNOSIS — D689 Coagulation defect, unspecified: Secondary | ICD-10-CM | POA: Diagnosis not present

## 2021-09-18 DIAGNOSIS — R760 Raised antibody titer: Secondary | ICD-10-CM | POA: Diagnosis not present

## 2021-09-18 DIAGNOSIS — E1142 Type 2 diabetes mellitus with diabetic polyneuropathy: Secondary | ICD-10-CM | POA: Diagnosis not present

## 2021-09-24 DIAGNOSIS — M5416 Radiculopathy, lumbar region: Secondary | ICD-10-CM | POA: Diagnosis not present

## 2021-09-24 DIAGNOSIS — Z79891 Long term (current) use of opiate analgesic: Secondary | ICD-10-CM | POA: Diagnosis not present

## 2021-09-24 DIAGNOSIS — Z5181 Encounter for therapeutic drug level monitoring: Secondary | ICD-10-CM | POA: Diagnosis not present

## 2021-10-01 DIAGNOSIS — E7849 Other hyperlipidemia: Secondary | ICD-10-CM | POA: Diagnosis not present

## 2021-10-01 DIAGNOSIS — E1143 Type 2 diabetes mellitus with diabetic autonomic (poly)neuropathy: Secondary | ICD-10-CM | POA: Diagnosis not present

## 2021-10-01 DIAGNOSIS — Z79899 Other long term (current) drug therapy: Secondary | ICD-10-CM | POA: Diagnosis not present

## 2021-10-01 DIAGNOSIS — I10 Essential (primary) hypertension: Secondary | ICD-10-CM | POA: Diagnosis not present

## 2021-10-01 DIAGNOSIS — R197 Diarrhea, unspecified: Secondary | ICD-10-CM | POA: Diagnosis not present

## 2021-10-01 DIAGNOSIS — R5383 Other fatigue: Secondary | ICD-10-CM | POA: Diagnosis not present

## 2021-10-01 DIAGNOSIS — E559 Vitamin D deficiency, unspecified: Secondary | ICD-10-CM | POA: Diagnosis not present

## 2021-10-01 DIAGNOSIS — K458 Other specified abdominal hernia without obstruction or gangrene: Secondary | ICD-10-CM | POA: Diagnosis not present

## 2021-10-06 DIAGNOSIS — I2510 Atherosclerotic heart disease of native coronary artery without angina pectoris: Secondary | ICD-10-CM | POA: Diagnosis not present

## 2021-10-06 DIAGNOSIS — E785 Hyperlipidemia, unspecified: Secondary | ICD-10-CM | POA: Diagnosis not present

## 2021-10-06 DIAGNOSIS — G4733 Obstructive sleep apnea (adult) (pediatric): Secondary | ICD-10-CM | POA: Diagnosis not present

## 2021-10-06 DIAGNOSIS — I272 Pulmonary hypertension, unspecified: Secondary | ICD-10-CM | POA: Diagnosis not present

## 2021-11-05 DIAGNOSIS — Z7901 Long term (current) use of anticoagulants: Secondary | ICD-10-CM | POA: Diagnosis not present

## 2021-11-10 DIAGNOSIS — E119 Type 2 diabetes mellitus without complications: Secondary | ICD-10-CM | POA: Diagnosis not present

## 2021-11-10 DIAGNOSIS — Z7951 Long term (current) use of inhaled steroids: Secondary | ICD-10-CM | POA: Diagnosis not present

## 2021-11-10 DIAGNOSIS — Z7901 Long term (current) use of anticoagulants: Secondary | ICD-10-CM | POA: Diagnosis not present

## 2021-11-10 DIAGNOSIS — R0609 Other forms of dyspnea: Secondary | ICD-10-CM | POA: Diagnosis not present

## 2021-11-10 DIAGNOSIS — D6861 Antiphospholipid syndrome: Secondary | ICD-10-CM | POA: Diagnosis not present

## 2021-11-10 DIAGNOSIS — J449 Chronic obstructive pulmonary disease, unspecified: Secondary | ICD-10-CM | POA: Diagnosis not present

## 2021-11-10 DIAGNOSIS — D751 Secondary polycythemia: Secondary | ICD-10-CM | POA: Diagnosis not present

## 2021-11-10 DIAGNOSIS — K909 Intestinal malabsorption, unspecified: Secondary | ICD-10-CM | POA: Diagnosis not present

## 2021-11-10 DIAGNOSIS — I2510 Atherosclerotic heart disease of native coronary artery without angina pectoris: Secondary | ICD-10-CM | POA: Diagnosis not present

## 2021-11-10 DIAGNOSIS — Z9981 Dependence on supplemental oxygen: Secondary | ICD-10-CM | POA: Diagnosis not present

## 2021-11-10 DIAGNOSIS — I10 Essential (primary) hypertension: Secondary | ICD-10-CM | POA: Diagnosis not present

## 2021-11-10 DIAGNOSIS — Z794 Long term (current) use of insulin: Secondary | ICD-10-CM | POA: Diagnosis not present

## 2021-11-10 DIAGNOSIS — Z9989 Dependence on other enabling machines and devices: Secondary | ICD-10-CM | POA: Diagnosis not present

## 2021-11-10 DIAGNOSIS — G4733 Obstructive sleep apnea (adult) (pediatric): Secondary | ICD-10-CM | POA: Diagnosis not present

## 2021-11-10 DIAGNOSIS — Z79899 Other long term (current) drug therapy: Secondary | ICD-10-CM | POA: Diagnosis not present

## 2021-11-10 DIAGNOSIS — Z87891 Personal history of nicotine dependence: Secondary | ICD-10-CM | POA: Diagnosis not present

## 2021-11-10 DIAGNOSIS — I272 Pulmonary hypertension, unspecified: Secondary | ICD-10-CM | POA: Diagnosis not present

## 2021-11-10 DIAGNOSIS — R0602 Shortness of breath: Secondary | ICD-10-CM | POA: Diagnosis not present

## 2021-11-10 DIAGNOSIS — Z7982 Long term (current) use of aspirin: Secondary | ICD-10-CM | POA: Diagnosis not present

## 2021-11-10 DIAGNOSIS — K5090 Crohn's disease, unspecified, without complications: Secondary | ICD-10-CM | POA: Diagnosis not present

## 2021-11-24 DIAGNOSIS — M5416 Radiculopathy, lumbar region: Secondary | ICD-10-CM | POA: Diagnosis not present

## 2021-12-05 DIAGNOSIS — M5416 Radiculopathy, lumbar region: Secondary | ICD-10-CM | POA: Diagnosis not present

## 2021-12-29 DIAGNOSIS — E230 Hypopituitarism: Secondary | ICD-10-CM | POA: Diagnosis not present

## 2021-12-29 DIAGNOSIS — Z794 Long term (current) use of insulin: Secondary | ICD-10-CM | POA: Diagnosis not present

## 2021-12-29 DIAGNOSIS — E1169 Type 2 diabetes mellitus with other specified complication: Secondary | ICD-10-CM | POA: Diagnosis not present

## 2022-01-07 DIAGNOSIS — Z1331 Encounter for screening for depression: Secondary | ICD-10-CM | POA: Diagnosis not present

## 2022-01-07 DIAGNOSIS — K458 Other specified abdominal hernia without obstruction or gangrene: Secondary | ICD-10-CM | POA: Diagnosis not present

## 2022-01-07 DIAGNOSIS — E1143 Type 2 diabetes mellitus with diabetic autonomic (poly)neuropathy: Secondary | ICD-10-CM | POA: Diagnosis not present

## 2022-01-07 DIAGNOSIS — R197 Diarrhea, unspecified: Secondary | ICD-10-CM | POA: Diagnosis not present

## 2022-01-07 DIAGNOSIS — I2510 Atherosclerotic heart disease of native coronary artery without angina pectoris: Secondary | ICD-10-CM | POA: Diagnosis not present

## 2022-01-07 DIAGNOSIS — I272 Pulmonary hypertension, unspecified: Secondary | ICD-10-CM | POA: Diagnosis not present

## 2022-01-07 DIAGNOSIS — I10 Essential (primary) hypertension: Secondary | ICD-10-CM | POA: Diagnosis not present

## 2022-01-07 DIAGNOSIS — Z7901 Long term (current) use of anticoagulants: Secondary | ICD-10-CM | POA: Diagnosis not present

## 2022-01-07 DIAGNOSIS — E785 Hyperlipidemia, unspecified: Secondary | ICD-10-CM | POA: Diagnosis not present

## 2022-01-07 DIAGNOSIS — Z0000 Encounter for general adult medical examination without abnormal findings: Secondary | ICD-10-CM | POA: Diagnosis not present

## 2022-01-07 DIAGNOSIS — E7849 Other hyperlipidemia: Secondary | ICD-10-CM | POA: Diagnosis not present

## 2022-02-19 DIAGNOSIS — Z7901 Long term (current) use of anticoagulants: Secondary | ICD-10-CM | POA: Diagnosis not present

## 2022-02-23 DIAGNOSIS — M5412 Radiculopathy, cervical region: Secondary | ICD-10-CM | POA: Diagnosis not present

## 2022-02-23 DIAGNOSIS — M5459 Other low back pain: Secondary | ICD-10-CM | POA: Diagnosis not present

## 2022-02-23 DIAGNOSIS — M542 Cervicalgia: Secondary | ICD-10-CM | POA: Diagnosis not present

## 2022-02-23 DIAGNOSIS — M25511 Pain in right shoulder: Secondary | ICD-10-CM | POA: Diagnosis not present

## 2022-02-23 DIAGNOSIS — M5030 Other cervical disc degeneration, unspecified cervical region: Secondary | ICD-10-CM | POA: Diagnosis not present

## 2022-02-23 DIAGNOSIS — M5136 Other intervertebral disc degeneration, lumbar region: Secondary | ICD-10-CM | POA: Diagnosis not present

## 2022-03-18 DIAGNOSIS — M65331 Trigger finger, right middle finger: Secondary | ICD-10-CM | POA: Diagnosis not present

## 2022-03-18 DIAGNOSIS — M65332 Trigger finger, left middle finger: Secondary | ICD-10-CM | POA: Diagnosis not present

## 2022-03-18 DIAGNOSIS — M13849 Other specified arthritis, unspecified hand: Secondary | ICD-10-CM | POA: Diagnosis not present

## 2022-03-24 DIAGNOSIS — M7541 Impingement syndrome of right shoulder: Secondary | ICD-10-CM | POA: Diagnosis not present

## 2022-03-24 DIAGNOSIS — M25511 Pain in right shoulder: Secondary | ICD-10-CM | POA: Diagnosis not present

## 2022-04-22 DIAGNOSIS — R197 Diarrhea, unspecified: Secondary | ICD-10-CM | POA: Diagnosis not present

## 2022-04-22 DIAGNOSIS — K458 Other specified abdominal hernia without obstruction or gangrene: Secondary | ICD-10-CM | POA: Diagnosis not present

## 2022-04-22 DIAGNOSIS — E7849 Other hyperlipidemia: Secondary | ICD-10-CM | POA: Diagnosis not present

## 2022-04-22 DIAGNOSIS — I10 Essential (primary) hypertension: Secondary | ICD-10-CM | POA: Diagnosis not present

## 2022-04-22 DIAGNOSIS — E1143 Type 2 diabetes mellitus with diabetic autonomic (poly)neuropathy: Secondary | ICD-10-CM | POA: Diagnosis not present

## 2022-05-12 DIAGNOSIS — Z7901 Long term (current) use of anticoagulants: Secondary | ICD-10-CM | POA: Diagnosis not present

## 2022-05-13 DIAGNOSIS — M5412 Radiculopathy, cervical region: Secondary | ICD-10-CM | POA: Diagnosis not present

## 2022-05-18 DIAGNOSIS — E291 Testicular hypofunction: Secondary | ICD-10-CM | POA: Diagnosis not present

## 2022-05-18 DIAGNOSIS — R55 Syncope and collapse: Secondary | ICD-10-CM | POA: Diagnosis not present

## 2022-05-18 DIAGNOSIS — I2721 Secondary pulmonary arterial hypertension: Secondary | ICD-10-CM | POA: Diagnosis not present

## 2022-05-18 DIAGNOSIS — Z87891 Personal history of nicotine dependence: Secondary | ICD-10-CM | POA: Diagnosis not present

## 2022-05-18 DIAGNOSIS — I10 Essential (primary) hypertension: Secondary | ICD-10-CM | POA: Diagnosis not present

## 2022-05-18 DIAGNOSIS — R911 Solitary pulmonary nodule: Secondary | ICD-10-CM | POA: Diagnosis not present

## 2022-05-18 DIAGNOSIS — E119 Type 2 diabetes mellitus without complications: Secondary | ICD-10-CM | POA: Diagnosis not present

## 2022-05-18 DIAGNOSIS — Z79899 Other long term (current) drug therapy: Secondary | ICD-10-CM | POA: Diagnosis not present

## 2022-05-18 DIAGNOSIS — R0609 Other forms of dyspnea: Secondary | ICD-10-CM | POA: Diagnosis not present

## 2022-05-18 DIAGNOSIS — Z7982 Long term (current) use of aspirin: Secondary | ICD-10-CM | POA: Diagnosis not present

## 2022-05-18 DIAGNOSIS — G4733 Obstructive sleep apnea (adult) (pediatric): Secondary | ICD-10-CM | POA: Diagnosis not present

## 2022-05-18 DIAGNOSIS — Z794 Long term (current) use of insulin: Secondary | ICD-10-CM | POA: Diagnosis not present

## 2022-05-18 DIAGNOSIS — R760 Raised antibody titer: Secondary | ICD-10-CM | POA: Diagnosis not present

## 2022-05-18 DIAGNOSIS — R0602 Shortness of breath: Secondary | ICD-10-CM | POA: Diagnosis not present

## 2022-05-25 DIAGNOSIS — Z7901 Long term (current) use of anticoagulants: Secondary | ICD-10-CM | POA: Diagnosis not present

## 2022-06-08 DIAGNOSIS — R0609 Other forms of dyspnea: Secondary | ICD-10-CM | POA: Diagnosis not present

## 2022-06-08 DIAGNOSIS — R760 Raised antibody titer: Secondary | ICD-10-CM | POA: Diagnosis not present

## 2022-06-08 DIAGNOSIS — E291 Testicular hypofunction: Secondary | ICD-10-CM | POA: Diagnosis not present

## 2022-06-08 DIAGNOSIS — R911 Solitary pulmonary nodule: Secondary | ICD-10-CM | POA: Diagnosis not present

## 2022-06-08 DIAGNOSIS — I2721 Secondary pulmonary arterial hypertension: Secondary | ICD-10-CM | POA: Diagnosis not present

## 2022-06-08 DIAGNOSIS — I3139 Other pericardial effusion (noninflammatory): Secondary | ICD-10-CM | POA: Diagnosis not present

## 2022-06-08 DIAGNOSIS — I371 Nonrheumatic pulmonary valve insufficiency: Secondary | ICD-10-CM | POA: Diagnosis not present

## 2022-06-15 DIAGNOSIS — I272 Pulmonary hypertension, unspecified: Secondary | ICD-10-CM | POA: Diagnosis not present

## 2022-06-15 DIAGNOSIS — G4733 Obstructive sleep apnea (adult) (pediatric): Secondary | ICD-10-CM | POA: Diagnosis not present

## 2022-06-15 DIAGNOSIS — I2510 Atherosclerotic heart disease of native coronary artery without angina pectoris: Secondary | ICD-10-CM | POA: Diagnosis not present

## 2022-07-06 DIAGNOSIS — Z79891 Long term (current) use of opiate analgesic: Secondary | ICD-10-CM | POA: Diagnosis not present

## 2022-07-06 DIAGNOSIS — M5412 Radiculopathy, cervical region: Secondary | ICD-10-CM | POA: Diagnosis not present

## 2022-07-06 DIAGNOSIS — M961 Postlaminectomy syndrome, not elsewhere classified: Secondary | ICD-10-CM | POA: Diagnosis not present

## 2022-07-06 DIAGNOSIS — M5416 Radiculopathy, lumbar region: Secondary | ICD-10-CM | POA: Diagnosis not present

## 2022-07-06 DIAGNOSIS — Z5181 Encounter for therapeutic drug level monitoring: Secondary | ICD-10-CM | POA: Diagnosis not present

## 2022-07-13 DIAGNOSIS — G4733 Obstructive sleep apnea (adult) (pediatric): Secondary | ICD-10-CM | POA: Diagnosis not present

## 2022-07-13 DIAGNOSIS — E669 Obesity, unspecified: Secondary | ICD-10-CM | POA: Diagnosis not present

## 2022-07-13 DIAGNOSIS — F17210 Nicotine dependence, cigarettes, uncomplicated: Secondary | ICD-10-CM | POA: Diagnosis not present

## 2022-07-13 DIAGNOSIS — I2721 Secondary pulmonary arterial hypertension: Secondary | ICD-10-CM | POA: Diagnosis not present

## 2022-07-13 DIAGNOSIS — R0609 Other forms of dyspnea: Secondary | ICD-10-CM | POA: Diagnosis not present

## 2022-07-13 DIAGNOSIS — Z6838 Body mass index (BMI) 38.0-38.9, adult: Secondary | ICD-10-CM | POA: Diagnosis not present

## 2022-07-13 DIAGNOSIS — T501X5A Adverse effect of loop [high-ceiling] diuretics, initial encounter: Secondary | ICD-10-CM | POA: Diagnosis not present

## 2022-07-27 DIAGNOSIS — R197 Diarrhea, unspecified: Secondary | ICD-10-CM | POA: Diagnosis not present

## 2022-07-27 DIAGNOSIS — E7849 Other hyperlipidemia: Secondary | ICD-10-CM | POA: Diagnosis not present

## 2022-07-27 DIAGNOSIS — E1143 Type 2 diabetes mellitus with diabetic autonomic (poly)neuropathy: Secondary | ICD-10-CM | POA: Diagnosis not present

## 2022-07-27 DIAGNOSIS — K458 Other specified abdominal hernia without obstruction or gangrene: Secondary | ICD-10-CM | POA: Diagnosis not present

## 2022-07-27 DIAGNOSIS — I10 Essential (primary) hypertension: Secondary | ICD-10-CM | POA: Diagnosis not present

## 2022-07-29 DIAGNOSIS — R918 Other nonspecific abnormal finding of lung field: Secondary | ICD-10-CM | POA: Diagnosis not present

## 2022-07-29 DIAGNOSIS — F17210 Nicotine dependence, cigarettes, uncomplicated: Secondary | ICD-10-CM | POA: Diagnosis not present

## 2022-08-24 DIAGNOSIS — K458 Other specified abdominal hernia without obstruction or gangrene: Secondary | ICD-10-CM | POA: Diagnosis not present

## 2022-08-24 DIAGNOSIS — R197 Diarrhea, unspecified: Secondary | ICD-10-CM | POA: Diagnosis not present

## 2022-08-24 DIAGNOSIS — E7849 Other hyperlipidemia: Secondary | ICD-10-CM | POA: Diagnosis not present

## 2022-08-24 DIAGNOSIS — I10 Essential (primary) hypertension: Secondary | ICD-10-CM | POA: Diagnosis not present

## 2022-08-24 DIAGNOSIS — Z7901 Long term (current) use of anticoagulants: Secondary | ICD-10-CM | POA: Diagnosis not present

## 2022-08-24 DIAGNOSIS — E291 Testicular hypofunction: Secondary | ICD-10-CM | POA: Diagnosis not present

## 2022-08-24 DIAGNOSIS — E1143 Type 2 diabetes mellitus with diabetic autonomic (poly)neuropathy: Secondary | ICD-10-CM | POA: Diagnosis not present

## 2022-09-14 DIAGNOSIS — Z23 Encounter for immunization: Secondary | ICD-10-CM | POA: Diagnosis not present

## 2022-10-04 DIAGNOSIS — K1120 Sialoadenitis, unspecified: Secondary | ICD-10-CM | POA: Diagnosis not present

## 2022-10-04 DIAGNOSIS — I10 Essential (primary) hypertension: Secondary | ICD-10-CM | POA: Diagnosis not present

## 2022-10-14 DIAGNOSIS — Z23 Encounter for immunization: Secondary | ICD-10-CM | POA: Diagnosis not present

## 2022-11-02 DIAGNOSIS — I10 Essential (primary) hypertension: Secondary | ICD-10-CM | POA: Diagnosis not present

## 2022-11-02 DIAGNOSIS — E1143 Type 2 diabetes mellitus with diabetic autonomic (poly)neuropathy: Secondary | ICD-10-CM | POA: Diagnosis not present

## 2022-11-02 DIAGNOSIS — N529 Male erectile dysfunction, unspecified: Secondary | ICD-10-CM | POA: Diagnosis not present

## 2022-11-02 DIAGNOSIS — H6012 Cellulitis of left external ear: Secondary | ICD-10-CM | POA: Diagnosis not present

## 2022-11-02 DIAGNOSIS — Z7901 Long term (current) use of anticoagulants: Secondary | ICD-10-CM | POA: Diagnosis not present

## 2022-11-06 DIAGNOSIS — M5459 Other low back pain: Secondary | ICD-10-CM | POA: Diagnosis not present

## 2022-11-06 DIAGNOSIS — M542 Cervicalgia: Secondary | ICD-10-CM | POA: Diagnosis not present

## 2022-11-06 DIAGNOSIS — M5412 Radiculopathy, cervical region: Secondary | ICD-10-CM | POA: Diagnosis not present

## 2022-11-06 DIAGNOSIS — Z79891 Long term (current) use of opiate analgesic: Secondary | ICD-10-CM | POA: Diagnosis not present

## 2022-11-06 DIAGNOSIS — M5030 Other cervical disc degeneration, unspecified cervical region: Secondary | ICD-10-CM | POA: Diagnosis not present

## 2022-11-06 DIAGNOSIS — M5416 Radiculopathy, lumbar region: Secondary | ICD-10-CM | POA: Diagnosis not present

## 2022-11-16 DIAGNOSIS — T501X5D Adverse effect of loop [high-ceiling] diuretics, subsequent encounter: Secondary | ICD-10-CM | POA: Diagnosis not present

## 2022-11-16 DIAGNOSIS — D6861 Antiphospholipid syndrome: Secondary | ICD-10-CM | POA: Diagnosis not present

## 2022-11-16 DIAGNOSIS — R0609 Other forms of dyspnea: Secondary | ICD-10-CM | POA: Diagnosis not present

## 2022-11-16 DIAGNOSIS — I2721 Secondary pulmonary arterial hypertension: Secondary | ICD-10-CM | POA: Diagnosis not present

## 2022-11-18 DIAGNOSIS — Z79899 Other long term (current) drug therapy: Secondary | ICD-10-CM | POA: Diagnosis not present

## 2022-11-18 DIAGNOSIS — Z5181 Encounter for therapeutic drug level monitoring: Secondary | ICD-10-CM | POA: Diagnosis not present

## 2022-11-18 DIAGNOSIS — M5416 Radiculopathy, lumbar region: Secondary | ICD-10-CM | POA: Diagnosis not present

## 2022-12-16 DIAGNOSIS — E785 Hyperlipidemia, unspecified: Secondary | ICD-10-CM | POA: Diagnosis not present

## 2022-12-16 DIAGNOSIS — G4733 Obstructive sleep apnea (adult) (pediatric): Secondary | ICD-10-CM | POA: Diagnosis not present

## 2022-12-16 DIAGNOSIS — I272 Pulmonary hypertension, unspecified: Secondary | ICD-10-CM | POA: Diagnosis not present

## 2022-12-16 DIAGNOSIS — I2510 Atherosclerotic heart disease of native coronary artery without angina pectoris: Secondary | ICD-10-CM | POA: Diagnosis not present

## 2022-12-23 DIAGNOSIS — K521 Toxic gastroenteritis and colitis: Secondary | ICD-10-CM | POA: Diagnosis not present

## 2022-12-23 DIAGNOSIS — R197 Diarrhea, unspecified: Secondary | ICD-10-CM | POA: Diagnosis not present

## 2022-12-23 DIAGNOSIS — K861 Other chronic pancreatitis: Secondary | ICD-10-CM | POA: Diagnosis not present

## 2022-12-23 DIAGNOSIS — K50013 Crohn's disease of small intestine with fistula: Secondary | ICD-10-CM | POA: Diagnosis not present

## 2022-12-23 DIAGNOSIS — K5010 Crohn's disease of large intestine without complications: Secondary | ICD-10-CM | POA: Diagnosis not present

## 2022-12-23 DIAGNOSIS — Z79899 Other long term (current) drug therapy: Secondary | ICD-10-CM | POA: Diagnosis not present

## 2022-12-27 DIAGNOSIS — M65332 Trigger finger, left middle finger: Secondary | ICD-10-CM | POA: Diagnosis not present

## 2023-01-26 DIAGNOSIS — I270 Primary pulmonary hypertension: Secondary | ICD-10-CM | POA: Diagnosis not present

## 2023-01-26 DIAGNOSIS — N529 Male erectile dysfunction, unspecified: Secondary | ICD-10-CM | POA: Diagnosis not present

## 2023-01-26 DIAGNOSIS — Z125 Encounter for screening for malignant neoplasm of prostate: Secondary | ICD-10-CM | POA: Diagnosis not present

## 2023-01-26 DIAGNOSIS — N182 Chronic kidney disease, stage 2 (mild): Secondary | ICD-10-CM | POA: Diagnosis not present

## 2023-01-26 DIAGNOSIS — I10 Essential (primary) hypertension: Secondary | ICD-10-CM | POA: Diagnosis not present

## 2023-01-26 DIAGNOSIS — Z0000 Encounter for general adult medical examination without abnormal findings: Secondary | ICD-10-CM | POA: Diagnosis not present

## 2023-01-26 DIAGNOSIS — Z1331 Encounter for screening for depression: Secondary | ICD-10-CM | POA: Diagnosis not present

## 2023-01-26 DIAGNOSIS — E1143 Type 2 diabetes mellitus with diabetic autonomic (poly)neuropathy: Secondary | ICD-10-CM | POA: Diagnosis not present

## 2023-01-31 DIAGNOSIS — M65331 Trigger finger, right middle finger: Secondary | ICD-10-CM | POA: Diagnosis not present

## 2023-02-22 DIAGNOSIS — R197 Diarrhea, unspecified: Secondary | ICD-10-CM | POA: Diagnosis not present

## 2023-03-10 DIAGNOSIS — M5030 Other cervical disc degeneration, unspecified cervical region: Secondary | ICD-10-CM | POA: Diagnosis not present

## 2023-03-10 DIAGNOSIS — M5412 Radiculopathy, cervical region: Secondary | ICD-10-CM | POA: Diagnosis not present

## 2023-03-10 DIAGNOSIS — M5134 Other intervertebral disc degeneration, thoracic region: Secondary | ICD-10-CM | POA: Diagnosis not present

## 2023-03-10 DIAGNOSIS — M791 Myalgia, unspecified site: Secondary | ICD-10-CM | POA: Diagnosis not present

## 2023-03-10 DIAGNOSIS — M5136 Other intervertebral disc degeneration, lumbar region: Secondary | ICD-10-CM | POA: Diagnosis not present

## 2023-03-12 DIAGNOSIS — M5416 Radiculopathy, lumbar region: Secondary | ICD-10-CM | POA: Diagnosis not present

## 2023-03-29 DIAGNOSIS — D6861 Antiphospholipid syndrome: Secondary | ICD-10-CM | POA: Diagnosis not present

## 2023-03-29 DIAGNOSIS — I2721 Secondary pulmonary arterial hypertension: Secondary | ICD-10-CM | POA: Diagnosis not present

## 2023-03-29 DIAGNOSIS — J9611 Chronic respiratory failure with hypoxia: Secondary | ICD-10-CM | POA: Diagnosis not present

## 2023-03-29 DIAGNOSIS — Z87891 Personal history of nicotine dependence: Secondary | ICD-10-CM | POA: Diagnosis not present

## 2023-03-29 DIAGNOSIS — G4733 Obstructive sleep apnea (adult) (pediatric): Secondary | ICD-10-CM | POA: Diagnosis not present

## 2023-06-13 DIAGNOSIS — Z1331 Encounter for screening for depression: Secondary | ICD-10-CM | POA: Diagnosis not present

## 2023-06-13 DIAGNOSIS — Z0000 Encounter for general adult medical examination without abnormal findings: Secondary | ICD-10-CM | POA: Diagnosis not present

## 2023-06-13 DIAGNOSIS — N529 Male erectile dysfunction, unspecified: Secondary | ICD-10-CM | POA: Diagnosis not present

## 2023-06-13 DIAGNOSIS — I270 Primary pulmonary hypertension: Secondary | ICD-10-CM | POA: Diagnosis not present

## 2023-06-13 DIAGNOSIS — E1143 Type 2 diabetes mellitus with diabetic autonomic (poly)neuropathy: Secondary | ICD-10-CM | POA: Diagnosis not present

## 2023-06-13 DIAGNOSIS — I10 Essential (primary) hypertension: Secondary | ICD-10-CM | POA: Diagnosis not present

## 2023-06-13 DIAGNOSIS — Z7901 Long term (current) use of anticoagulants: Secondary | ICD-10-CM | POA: Diagnosis not present

## 2023-07-12 DIAGNOSIS — M7918 Myalgia, other site: Secondary | ICD-10-CM | POA: Diagnosis not present

## 2023-07-12 DIAGNOSIS — M5459 Other low back pain: Secondary | ICD-10-CM | POA: Diagnosis not present

## 2023-07-12 DIAGNOSIS — M5136 Other intervertebral disc degeneration, lumbar region: Secondary | ICD-10-CM | POA: Diagnosis not present

## 2023-07-12 DIAGNOSIS — M5134 Other intervertebral disc degeneration, thoracic region: Secondary | ICD-10-CM | POA: Diagnosis not present

## 2023-07-12 DIAGNOSIS — M5412 Radiculopathy, cervical region: Secondary | ICD-10-CM | POA: Diagnosis not present

## 2023-07-16 DIAGNOSIS — M5416 Radiculopathy, lumbar region: Secondary | ICD-10-CM | POA: Diagnosis not present

## 2023-08-24 DIAGNOSIS — F17210 Nicotine dependence, cigarettes, uncomplicated: Secondary | ICD-10-CM | POA: Diagnosis not present

## 2023-10-10 DIAGNOSIS — N1831 Chronic kidney disease, stage 3a: Secondary | ICD-10-CM | POA: Diagnosis not present

## 2023-10-10 DIAGNOSIS — I270 Primary pulmonary hypertension: Secondary | ICD-10-CM | POA: Diagnosis not present

## 2023-10-10 DIAGNOSIS — E1143 Type 2 diabetes mellitus with diabetic autonomic (poly)neuropathy: Secondary | ICD-10-CM | POA: Diagnosis not present

## 2023-10-10 DIAGNOSIS — E1122 Type 2 diabetes mellitus with diabetic chronic kidney disease: Secondary | ICD-10-CM | POA: Diagnosis not present

## 2023-10-10 DIAGNOSIS — I10 Essential (primary) hypertension: Secondary | ICD-10-CM | POA: Diagnosis not present

## 2023-10-10 DIAGNOSIS — L309 Dermatitis, unspecified: Secondary | ICD-10-CM | POA: Diagnosis not present

## 2023-10-10 DIAGNOSIS — N529 Male erectile dysfunction, unspecified: Secondary | ICD-10-CM | POA: Diagnosis not present

## 2023-10-11 DIAGNOSIS — Z6835 Body mass index (BMI) 35.0-35.9, adult: Secondary | ICD-10-CM | POA: Diagnosis not present

## 2023-10-11 DIAGNOSIS — F17210 Nicotine dependence, cigarettes, uncomplicated: Secondary | ICD-10-CM | POA: Diagnosis not present

## 2023-10-11 DIAGNOSIS — G4733 Obstructive sleep apnea (adult) (pediatric): Secondary | ICD-10-CM | POA: Diagnosis not present

## 2023-10-11 DIAGNOSIS — I2721 Secondary pulmonary arterial hypertension: Secondary | ICD-10-CM | POA: Diagnosis not present

## 2023-10-11 DIAGNOSIS — D6861 Antiphospholipid syndrome: Secondary | ICD-10-CM | POA: Diagnosis not present

## 2023-10-11 DIAGNOSIS — J9611 Chronic respiratory failure with hypoxia: Secondary | ICD-10-CM | POA: Diagnosis not present

## 2023-11-08 DIAGNOSIS — M961 Postlaminectomy syndrome, not elsewhere classified: Secondary | ICD-10-CM | POA: Diagnosis not present

## 2023-11-08 DIAGNOSIS — M51362 Other intervertebral disc degeneration, lumbar region with discogenic back pain and lower extremity pain: Secondary | ICD-10-CM | POA: Diagnosis not present

## 2023-11-08 DIAGNOSIS — M5412 Radiculopathy, cervical region: Secondary | ICD-10-CM | POA: Diagnosis not present

## 2023-11-08 DIAGNOSIS — Z981 Arthrodesis status: Secondary | ICD-10-CM | POA: Diagnosis not present

## 2023-11-08 DIAGNOSIS — M5134 Other intervertebral disc degeneration, thoracic region: Secondary | ICD-10-CM | POA: Diagnosis not present

## 2023-11-08 DIAGNOSIS — M791 Myalgia, unspecified site: Secondary | ICD-10-CM | POA: Diagnosis not present

## 2023-11-08 DIAGNOSIS — M5416 Radiculopathy, lumbar region: Secondary | ICD-10-CM | POA: Diagnosis not present

## 2023-11-08 DIAGNOSIS — M4800 Spinal stenosis, site unspecified: Secondary | ICD-10-CM | POA: Diagnosis not present

## 2023-11-08 DIAGNOSIS — M542 Cervicalgia: Secondary | ICD-10-CM | POA: Diagnosis not present

## 2023-11-08 DIAGNOSIS — Z5181 Encounter for therapeutic drug level monitoring: Secondary | ICD-10-CM | POA: Diagnosis not present

## 2023-11-08 DIAGNOSIS — Z79891 Long term (current) use of opiate analgesic: Secondary | ICD-10-CM | POA: Diagnosis not present

## 2023-11-08 DIAGNOSIS — Z79899 Other long term (current) drug therapy: Secondary | ICD-10-CM | POA: Diagnosis not present

## 2023-11-08 DIAGNOSIS — M5459 Other low back pain: Secondary | ICD-10-CM | POA: Diagnosis not present

## 2023-11-10 DIAGNOSIS — Z23 Encounter for immunization: Secondary | ICD-10-CM | POA: Diagnosis not present

## 2024-02-06 DIAGNOSIS — I270 Primary pulmonary hypertension: Secondary | ICD-10-CM | POA: Diagnosis not present

## 2024-02-06 DIAGNOSIS — N529 Male erectile dysfunction, unspecified: Secondary | ICD-10-CM | POA: Diagnosis not present

## 2024-02-06 DIAGNOSIS — J9611 Chronic respiratory failure with hypoxia: Secondary | ICD-10-CM | POA: Diagnosis not present

## 2024-02-06 DIAGNOSIS — N1831 Chronic kidney disease, stage 3a: Secondary | ICD-10-CM | POA: Diagnosis not present

## 2024-02-06 DIAGNOSIS — E1143 Type 2 diabetes mellitus with diabetic autonomic (poly)neuropathy: Secondary | ICD-10-CM | POA: Diagnosis not present

## 2024-02-06 DIAGNOSIS — E1122 Type 2 diabetes mellitus with diabetic chronic kidney disease: Secondary | ICD-10-CM | POA: Diagnosis not present

## 2024-02-06 DIAGNOSIS — L309 Dermatitis, unspecified: Secondary | ICD-10-CM | POA: Diagnosis not present

## 2024-02-06 DIAGNOSIS — I10 Essential (primary) hypertension: Secondary | ICD-10-CM | POA: Diagnosis not present

## 2024-02-06 DIAGNOSIS — Z1331 Encounter for screening for depression: Secondary | ICD-10-CM | POA: Diagnosis not present

## 2024-02-06 DIAGNOSIS — Z0000 Encounter for general adult medical examination without abnormal findings: Secondary | ICD-10-CM | POA: Diagnosis not present

## 2024-02-06 DIAGNOSIS — Z125 Encounter for screening for malignant neoplasm of prostate: Secondary | ICD-10-CM | POA: Diagnosis not present

## 2024-03-08 DIAGNOSIS — Z79899 Other long term (current) drug therapy: Secondary | ICD-10-CM | POA: Diagnosis not present

## 2024-03-08 DIAGNOSIS — M542 Cervicalgia: Secondary | ICD-10-CM | POA: Diagnosis not present

## 2024-03-08 DIAGNOSIS — M4800 Spinal stenosis, site unspecified: Secondary | ICD-10-CM | POA: Diagnosis not present

## 2024-03-08 DIAGNOSIS — M5416 Radiculopathy, lumbar region: Secondary | ICD-10-CM | POA: Diagnosis not present

## 2024-03-08 DIAGNOSIS — M5030 Other cervical disc degeneration, unspecified cervical region: Secondary | ICD-10-CM | POA: Diagnosis not present

## 2024-03-08 DIAGNOSIS — M5412 Radiculopathy, cervical region: Secondary | ICD-10-CM | POA: Diagnosis not present

## 2024-03-08 DIAGNOSIS — M51362 Other intervertebral disc degeneration, lumbar region with discogenic back pain and lower extremity pain: Secondary | ICD-10-CM | POA: Diagnosis not present

## 2024-03-08 DIAGNOSIS — M5134 Other intervertebral disc degeneration, thoracic region: Secondary | ICD-10-CM | POA: Diagnosis not present

## 2024-03-08 DIAGNOSIS — M791 Myalgia, unspecified site: Secondary | ICD-10-CM | POA: Diagnosis not present

## 2024-03-08 DIAGNOSIS — M961 Postlaminectomy syndrome, not elsewhere classified: Secondary | ICD-10-CM | POA: Diagnosis not present

## 2024-03-08 DIAGNOSIS — M5459 Other low back pain: Secondary | ICD-10-CM | POA: Diagnosis not present

## 2024-03-08 DIAGNOSIS — Z79891 Long term (current) use of opiate analgesic: Secondary | ICD-10-CM | POA: Diagnosis not present

## 2024-03-22 DIAGNOSIS — M5416 Radiculopathy, lumbar region: Secondary | ICD-10-CM | POA: Diagnosis not present

## 2024-06-21 DIAGNOSIS — J9611 Chronic respiratory failure with hypoxia: Secondary | ICD-10-CM | POA: Diagnosis not present

## 2024-06-21 DIAGNOSIS — E1122 Type 2 diabetes mellitus with diabetic chronic kidney disease: Secondary | ICD-10-CM | POA: Diagnosis not present

## 2024-06-21 DIAGNOSIS — N529 Male erectile dysfunction, unspecified: Secondary | ICD-10-CM | POA: Diagnosis not present

## 2024-06-21 DIAGNOSIS — I270 Primary pulmonary hypertension: Secondary | ICD-10-CM | POA: Diagnosis not present

## 2024-06-21 DIAGNOSIS — N1831 Chronic kidney disease, stage 3a: Secondary | ICD-10-CM | POA: Diagnosis not present

## 2024-06-29 DIAGNOSIS — E1122 Type 2 diabetes mellitus with diabetic chronic kidney disease: Secondary | ICD-10-CM | POA: Diagnosis not present

## 2024-06-29 DIAGNOSIS — W19XXXA Unspecified fall, initial encounter: Secondary | ICD-10-CM | POA: Diagnosis not present

## 2024-06-29 DIAGNOSIS — E785 Hyperlipidemia, unspecified: Secondary | ICD-10-CM | POA: Diagnosis not present

## 2024-06-29 DIAGNOSIS — Z7409 Other reduced mobility: Secondary | ICD-10-CM | POA: Diagnosis not present

## 2024-06-29 DIAGNOSIS — Z794 Long term (current) use of insulin: Secondary | ICD-10-CM | POA: Diagnosis not present

## 2024-06-29 DIAGNOSIS — S065XAA Traumatic subdural hemorrhage with loss of consciousness status unknown, initial encounter: Secondary | ICD-10-CM | POA: Diagnosis not present

## 2024-06-29 DIAGNOSIS — N179 Acute kidney failure, unspecified: Secondary | ICD-10-CM | POA: Diagnosis not present

## 2024-06-29 DIAGNOSIS — D631 Anemia in chronic kidney disease: Secondary | ICD-10-CM | POA: Diagnosis not present

## 2024-06-29 DIAGNOSIS — E871 Hypo-osmolality and hyponatremia: Secondary | ICD-10-CM | POA: Diagnosis not present

## 2024-06-29 DIAGNOSIS — D649 Anemia, unspecified: Secondary | ICD-10-CM | POA: Diagnosis not present

## 2024-06-29 DIAGNOSIS — N3289 Other specified disorders of bladder: Secondary | ICD-10-CM | POA: Diagnosis not present

## 2024-06-29 DIAGNOSIS — G4733 Obstructive sleep apnea (adult) (pediatric): Secondary | ICD-10-CM | POA: Diagnosis not present

## 2024-06-29 DIAGNOSIS — S06369A Traumatic hemorrhage of cerebrum, unspecified, with loss of consciousness of unspecified duration, initial encounter: Secondary | ICD-10-CM | POA: Diagnosis not present

## 2024-06-29 DIAGNOSIS — G9389 Other specified disorders of brain: Secondary | ICD-10-CM | POA: Diagnosis not present

## 2024-06-29 DIAGNOSIS — I272 Pulmonary hypertension, unspecified: Secondary | ICD-10-CM | POA: Diagnosis not present

## 2024-06-29 DIAGNOSIS — S06A0XA Traumatic brain compression without herniation, initial encounter: Secondary | ICD-10-CM | POA: Diagnosis not present

## 2024-06-29 DIAGNOSIS — I082 Rheumatic disorders of both aortic and tricuspid valves: Secondary | ICD-10-CM | POA: Diagnosis not present

## 2024-06-29 DIAGNOSIS — E1149 Type 2 diabetes mellitus with other diabetic neurological complication: Secondary | ICD-10-CM | POA: Diagnosis not present

## 2024-06-29 DIAGNOSIS — K50919 Crohn's disease, unspecified, with unspecified complications: Secondary | ICD-10-CM | POA: Diagnosis not present

## 2024-06-29 DIAGNOSIS — I1A0 Resistant hypertension: Secondary | ICD-10-CM | POA: Diagnosis not present

## 2024-06-29 DIAGNOSIS — R739 Hyperglycemia, unspecified: Secondary | ICD-10-CM | POA: Diagnosis not present

## 2024-06-29 DIAGNOSIS — E1165 Type 2 diabetes mellitus with hyperglycemia: Secondary | ICD-10-CM | POA: Diagnosis not present

## 2024-06-29 DIAGNOSIS — G894 Chronic pain syndrome: Secondary | ICD-10-CM | POA: Diagnosis not present

## 2024-06-29 DIAGNOSIS — S065X0A Traumatic subdural hemorrhage without loss of consciousness, initial encounter: Secondary | ICD-10-CM | POA: Diagnosis not present

## 2024-06-29 DIAGNOSIS — R4701 Aphasia: Secondary | ICD-10-CM | POA: Diagnosis not present

## 2024-06-29 DIAGNOSIS — N1831 Chronic kidney disease, stage 3a: Secondary | ICD-10-CM | POA: Diagnosis not present

## 2024-06-29 DIAGNOSIS — M549 Dorsalgia, unspecified: Secondary | ICD-10-CM | POA: Diagnosis not present

## 2024-06-29 DIAGNOSIS — D6832 Hemorrhagic disorder due to extrinsic circulating anticoagulants: Secondary | ICD-10-CM | POA: Diagnosis not present

## 2024-06-29 DIAGNOSIS — Z043 Encounter for examination and observation following other accident: Secondary | ICD-10-CM | POA: Diagnosis not present

## 2024-06-29 DIAGNOSIS — E1169 Type 2 diabetes mellitus with other specified complication: Secondary | ICD-10-CM | POA: Diagnosis not present

## 2024-06-29 DIAGNOSIS — E1140 Type 2 diabetes mellitus with diabetic neuropathy, unspecified: Secondary | ICD-10-CM | POA: Diagnosis not present

## 2024-06-29 DIAGNOSIS — Z6841 Body Mass Index (BMI) 40.0 and over, adult: Secondary | ICD-10-CM | POA: Diagnosis not present

## 2024-06-29 DIAGNOSIS — R569 Unspecified convulsions: Secondary | ICD-10-CM | POA: Diagnosis not present

## 2024-06-29 DIAGNOSIS — I3139 Other pericardial effusion (noninflammatory): Secondary | ICD-10-CM | POA: Diagnosis not present

## 2024-06-29 DIAGNOSIS — I129 Hypertensive chronic kidney disease with stage 1 through stage 4 chronic kidney disease, or unspecified chronic kidney disease: Secondary | ICD-10-CM | POA: Diagnosis not present

## 2024-06-29 DIAGNOSIS — I2510 Atherosclerotic heart disease of native coronary artery without angina pectoris: Secondary | ICD-10-CM | POA: Diagnosis not present

## 2024-06-29 DIAGNOSIS — D509 Iron deficiency anemia, unspecified: Secondary | ICD-10-CM | POA: Diagnosis not present

## 2024-06-29 DIAGNOSIS — K5090 Crohn's disease, unspecified, without complications: Secondary | ICD-10-CM | POA: Diagnosis not present

## 2024-06-29 DIAGNOSIS — J9611 Chronic respiratory failure with hypoxia: Secondary | ICD-10-CM | POA: Diagnosis not present

## 2024-06-29 DIAGNOSIS — I4891 Unspecified atrial fibrillation: Secondary | ICD-10-CM | POA: Diagnosis not present

## 2024-06-29 DIAGNOSIS — S061X0A Traumatic cerebral edema without loss of consciousness, initial encounter: Secondary | ICD-10-CM | POA: Diagnosis not present

## 2024-06-29 DIAGNOSIS — R110 Nausea: Secondary | ICD-10-CM | POA: Diagnosis not present

## 2024-06-29 DIAGNOSIS — G4489 Other headache syndrome: Secondary | ICD-10-CM | POA: Diagnosis not present

## 2024-06-29 DIAGNOSIS — R001 Bradycardia, unspecified: Secondary | ICD-10-CM | POA: Diagnosis not present

## 2024-06-29 DIAGNOSIS — G9340 Encephalopathy, unspecified: Secondary | ICD-10-CM | POA: Diagnosis not present

## 2024-06-29 DIAGNOSIS — R0602 Shortness of breath: Secondary | ICD-10-CM | POA: Diagnosis not present

## 2024-06-29 DIAGNOSIS — I6200 Nontraumatic subdural hemorrhage, unspecified: Secondary | ICD-10-CM | POA: Diagnosis not present

## 2024-06-29 DIAGNOSIS — E782 Mixed hyperlipidemia: Secondary | ICD-10-CM | POA: Diagnosis not present

## 2024-06-29 DIAGNOSIS — D689 Coagulation defect, unspecified: Secondary | ICD-10-CM | POA: Diagnosis not present

## 2024-06-29 DIAGNOSIS — E66812 Obesity, class 2: Secondary | ICD-10-CM | POA: Diagnosis not present

## 2024-06-29 DIAGNOSIS — D6861 Antiphospholipid syndrome: Secondary | ICD-10-CM | POA: Diagnosis not present

## 2024-06-30 DIAGNOSIS — I6200 Nontraumatic subdural hemorrhage, unspecified: Secondary | ICD-10-CM | POA: Diagnosis not present

## 2024-06-30 DIAGNOSIS — R569 Unspecified convulsions: Secondary | ICD-10-CM | POA: Diagnosis not present

## 2024-06-30 DIAGNOSIS — N3289 Other specified disorders of bladder: Secondary | ICD-10-CM | POA: Diagnosis not present

## 2024-06-30 DIAGNOSIS — S065XAA Traumatic subdural hemorrhage with loss of consciousness status unknown, initial encounter: Secondary | ICD-10-CM | POA: Diagnosis not present

## 2024-07-01 DIAGNOSIS — I3139 Other pericardial effusion (noninflammatory): Secondary | ICD-10-CM | POA: Diagnosis not present

## 2024-07-01 DIAGNOSIS — S065XAA Traumatic subdural hemorrhage with loss of consciousness status unknown, initial encounter: Secondary | ICD-10-CM | POA: Diagnosis not present

## 2024-07-01 DIAGNOSIS — I082 Rheumatic disorders of both aortic and tricuspid valves: Secondary | ICD-10-CM | POA: Diagnosis not present

## 2024-07-01 DIAGNOSIS — R569 Unspecified convulsions: Secondary | ICD-10-CM | POA: Diagnosis not present

## 2024-07-02 DIAGNOSIS — R569 Unspecified convulsions: Secondary | ICD-10-CM | POA: Diagnosis not present

## 2024-07-02 DIAGNOSIS — S065XAA Traumatic subdural hemorrhage with loss of consciousness status unknown, initial encounter: Secondary | ICD-10-CM | POA: Diagnosis not present

## 2024-07-03 DIAGNOSIS — E1169 Type 2 diabetes mellitus with other specified complication: Secondary | ICD-10-CM | POA: Diagnosis not present

## 2024-07-03 DIAGNOSIS — J9611 Chronic respiratory failure with hypoxia: Secondary | ICD-10-CM | POA: Diagnosis not present

## 2024-07-03 DIAGNOSIS — S065XAA Traumatic subdural hemorrhage with loss of consciousness status unknown, initial encounter: Secondary | ICD-10-CM | POA: Diagnosis not present

## 2024-07-03 DIAGNOSIS — E66812 Obesity, class 2: Secondary | ICD-10-CM | POA: Diagnosis not present

## 2024-07-03 DIAGNOSIS — I2510 Atherosclerotic heart disease of native coronary artery without angina pectoris: Secondary | ICD-10-CM | POA: Diagnosis not present

## 2024-07-03 DIAGNOSIS — I272 Pulmonary hypertension, unspecified: Secondary | ICD-10-CM | POA: Diagnosis not present

## 2024-07-03 DIAGNOSIS — I1A0 Resistant hypertension: Secondary | ICD-10-CM | POA: Diagnosis not present

## 2024-07-03 DIAGNOSIS — I4891 Unspecified atrial fibrillation: Secondary | ICD-10-CM | POA: Diagnosis not present

## 2024-07-03 DIAGNOSIS — K50919 Crohn's disease, unspecified, with unspecified complications: Secondary | ICD-10-CM | POA: Diagnosis not present

## 2024-07-03 DIAGNOSIS — Z794 Long term (current) use of insulin: Secondary | ICD-10-CM | POA: Diagnosis not present

## 2024-07-03 DIAGNOSIS — D6861 Antiphospholipid syndrome: Secondary | ICD-10-CM | POA: Diagnosis not present

## 2024-07-03 DIAGNOSIS — N179 Acute kidney failure, unspecified: Secondary | ICD-10-CM | POA: Diagnosis not present

## 2024-07-04 DIAGNOSIS — S065XAA Traumatic subdural hemorrhage with loss of consciousness status unknown, initial encounter: Secondary | ICD-10-CM | POA: Diagnosis not present

## 2024-07-04 DIAGNOSIS — Z794 Long term (current) use of insulin: Secondary | ICD-10-CM | POA: Diagnosis not present

## 2024-07-04 DIAGNOSIS — I4891 Unspecified atrial fibrillation: Secondary | ICD-10-CM | POA: Diagnosis not present

## 2024-07-04 DIAGNOSIS — E782 Mixed hyperlipidemia: Secondary | ICD-10-CM | POA: Diagnosis not present

## 2024-07-04 DIAGNOSIS — J9611 Chronic respiratory failure with hypoxia: Secondary | ICD-10-CM | POA: Diagnosis not present

## 2024-07-04 DIAGNOSIS — E1165 Type 2 diabetes mellitus with hyperglycemia: Secondary | ICD-10-CM | POA: Diagnosis not present

## 2024-07-04 DIAGNOSIS — D6861 Antiphospholipid syndrome: Secondary | ICD-10-CM | POA: Diagnosis not present

## 2024-07-04 DIAGNOSIS — I2510 Atherosclerotic heart disease of native coronary artery without angina pectoris: Secondary | ICD-10-CM | POA: Diagnosis not present

## 2024-07-04 DIAGNOSIS — N1831 Chronic kidney disease, stage 3a: Secondary | ICD-10-CM | POA: Diagnosis not present

## 2024-07-04 DIAGNOSIS — E1169 Type 2 diabetes mellitus with other specified complication: Secondary | ICD-10-CM | POA: Diagnosis not present

## 2024-07-04 DIAGNOSIS — E66812 Obesity, class 2: Secondary | ICD-10-CM | POA: Diagnosis not present

## 2024-07-04 DIAGNOSIS — G894 Chronic pain syndrome: Secondary | ICD-10-CM | POA: Diagnosis not present

## 2024-07-05 DIAGNOSIS — G4733 Obstructive sleep apnea (adult) (pediatric): Secondary | ICD-10-CM | POA: Diagnosis not present

## 2024-07-05 DIAGNOSIS — I2510 Atherosclerotic heart disease of native coronary artery without angina pectoris: Secondary | ICD-10-CM | POA: Diagnosis not present

## 2024-07-05 DIAGNOSIS — Z6841 Body Mass Index (BMI) 40.0 and over, adult: Secondary | ICD-10-CM | POA: Diagnosis not present

## 2024-07-05 DIAGNOSIS — N1831 Chronic kidney disease, stage 3a: Secondary | ICD-10-CM | POA: Diagnosis not present

## 2024-07-05 DIAGNOSIS — E1169 Type 2 diabetes mellitus with other specified complication: Secondary | ICD-10-CM | POA: Diagnosis not present

## 2024-07-05 DIAGNOSIS — R739 Hyperglycemia, unspecified: Secondary | ICD-10-CM | POA: Diagnosis not present

## 2024-07-05 DIAGNOSIS — Z7409 Other reduced mobility: Secondary | ICD-10-CM | POA: Diagnosis not present

## 2024-07-05 DIAGNOSIS — Z794 Long term (current) use of insulin: Secondary | ICD-10-CM | POA: Diagnosis not present

## 2024-07-05 DIAGNOSIS — S065XAA Traumatic subdural hemorrhage with loss of consciousness status unknown, initial encounter: Secondary | ICD-10-CM | POA: Diagnosis not present

## 2024-07-05 DIAGNOSIS — E66812 Obesity, class 2: Secondary | ICD-10-CM | POA: Diagnosis not present

## 2024-07-05 DIAGNOSIS — J9611 Chronic respiratory failure with hypoxia: Secondary | ICD-10-CM | POA: Diagnosis not present

## 2024-07-05 DIAGNOSIS — E1140 Type 2 diabetes mellitus with diabetic neuropathy, unspecified: Secondary | ICD-10-CM | POA: Diagnosis not present

## 2024-07-05 DIAGNOSIS — D6861 Antiphospholipid syndrome: Secondary | ICD-10-CM | POA: Diagnosis not present

## 2024-07-05 DIAGNOSIS — I4891 Unspecified atrial fibrillation: Secondary | ICD-10-CM | POA: Diagnosis not present

## 2024-07-05 DIAGNOSIS — E1165 Type 2 diabetes mellitus with hyperglycemia: Secondary | ICD-10-CM | POA: Diagnosis not present

## 2024-07-06 DIAGNOSIS — R739 Hyperglycemia, unspecified: Secondary | ICD-10-CM | POA: Diagnosis not present

## 2024-07-06 DIAGNOSIS — Z794 Long term (current) use of insulin: Secondary | ICD-10-CM | POA: Diagnosis not present

## 2024-07-06 DIAGNOSIS — E1140 Type 2 diabetes mellitus with diabetic neuropathy, unspecified: Secondary | ICD-10-CM | POA: Diagnosis not present

## 2024-07-06 DIAGNOSIS — S065XAA Traumatic subdural hemorrhage with loss of consciousness status unknown, initial encounter: Secondary | ICD-10-CM | POA: Diagnosis not present

## 2024-07-06 DIAGNOSIS — Z6841 Body Mass Index (BMI) 40.0 and over, adult: Secondary | ICD-10-CM | POA: Diagnosis not present

## 2024-07-07 DIAGNOSIS — E1140 Type 2 diabetes mellitus with diabetic neuropathy, unspecified: Secondary | ICD-10-CM | POA: Diagnosis not present

## 2024-07-07 DIAGNOSIS — Z6841 Body Mass Index (BMI) 40.0 and over, adult: Secondary | ICD-10-CM | POA: Diagnosis not present

## 2024-07-07 DIAGNOSIS — Z794 Long term (current) use of insulin: Secondary | ICD-10-CM | POA: Diagnosis not present

## 2024-07-07 DIAGNOSIS — R739 Hyperglycemia, unspecified: Secondary | ICD-10-CM | POA: Diagnosis not present

## 2024-07-07 DIAGNOSIS — S065XAA Traumatic subdural hemorrhage with loss of consciousness status unknown, initial encounter: Secondary | ICD-10-CM | POA: Diagnosis not present

## 2024-07-08 DIAGNOSIS — Z794 Long term (current) use of insulin: Secondary | ICD-10-CM | POA: Diagnosis not present

## 2024-07-08 DIAGNOSIS — E1140 Type 2 diabetes mellitus with diabetic neuropathy, unspecified: Secondary | ICD-10-CM | POA: Diagnosis not present

## 2024-07-08 DIAGNOSIS — Z6841 Body Mass Index (BMI) 40.0 and over, adult: Secondary | ICD-10-CM | POA: Diagnosis not present

## 2024-07-08 DIAGNOSIS — R739 Hyperglycemia, unspecified: Secondary | ICD-10-CM | POA: Diagnosis not present

## 2024-07-08 DIAGNOSIS — S065XAA Traumatic subdural hemorrhage with loss of consciousness status unknown, initial encounter: Secondary | ICD-10-CM | POA: Diagnosis not present

## 2024-07-09 DIAGNOSIS — Z6841 Body Mass Index (BMI) 40.0 and over, adult: Secondary | ICD-10-CM | POA: Diagnosis not present

## 2024-07-09 DIAGNOSIS — S065XAA Traumatic subdural hemorrhage with loss of consciousness status unknown, initial encounter: Secondary | ICD-10-CM | POA: Diagnosis not present

## 2024-07-09 DIAGNOSIS — Z794 Long term (current) use of insulin: Secondary | ICD-10-CM | POA: Diagnosis not present

## 2024-07-09 DIAGNOSIS — E1140 Type 2 diabetes mellitus with diabetic neuropathy, unspecified: Secondary | ICD-10-CM | POA: Diagnosis not present

## 2024-07-09 DIAGNOSIS — R739 Hyperglycemia, unspecified: Secondary | ICD-10-CM | POA: Diagnosis not present

## 2024-07-10 DIAGNOSIS — Z6841 Body Mass Index (BMI) 40.0 and over, adult: Secondary | ICD-10-CM | POA: Diagnosis not present

## 2024-07-10 DIAGNOSIS — Z794 Long term (current) use of insulin: Secondary | ICD-10-CM | POA: Diagnosis not present

## 2024-07-10 DIAGNOSIS — E1140 Type 2 diabetes mellitus with diabetic neuropathy, unspecified: Secondary | ICD-10-CM | POA: Diagnosis not present

## 2024-07-10 DIAGNOSIS — R739 Hyperglycemia, unspecified: Secondary | ICD-10-CM | POA: Diagnosis not present

## 2024-07-10 DIAGNOSIS — S065XAA Traumatic subdural hemorrhage with loss of consciousness status unknown, initial encounter: Secondary | ICD-10-CM | POA: Diagnosis not present

## 2024-07-11 DIAGNOSIS — G4733 Obstructive sleep apnea (adult) (pediatric): Secondary | ICD-10-CM | POA: Diagnosis not present

## 2024-07-11 DIAGNOSIS — Z6841 Body Mass Index (BMI) 40.0 and over, adult: Secondary | ICD-10-CM | POA: Diagnosis not present

## 2024-07-11 DIAGNOSIS — I480 Paroxysmal atrial fibrillation: Secondary | ICD-10-CM | POA: Diagnosis not present

## 2024-07-11 DIAGNOSIS — E785 Hyperlipidemia, unspecified: Secondary | ICD-10-CM | POA: Diagnosis not present

## 2024-07-11 DIAGNOSIS — I272 Pulmonary hypertension, unspecified: Secondary | ICD-10-CM | POA: Diagnosis not present

## 2024-07-11 DIAGNOSIS — R2689 Other abnormalities of gait and mobility: Secondary | ICD-10-CM | POA: Diagnosis not present

## 2024-07-11 DIAGNOSIS — N183 Chronic kidney disease, stage 3 unspecified: Secondary | ICD-10-CM | POA: Diagnosis not present

## 2024-07-11 DIAGNOSIS — E66813 Obesity, class 3: Secondary | ICD-10-CM | POA: Diagnosis not present

## 2024-07-11 DIAGNOSIS — I2724 Chronic thromboembolic pulmonary hypertension: Secondary | ICD-10-CM | POA: Diagnosis not present

## 2024-07-11 DIAGNOSIS — N1831 Chronic kidney disease, stage 3a: Secondary | ICD-10-CM | POA: Diagnosis not present

## 2024-07-11 DIAGNOSIS — I10 Essential (primary) hypertension: Secondary | ICD-10-CM | POA: Diagnosis not present

## 2024-07-11 DIAGNOSIS — E1165 Type 2 diabetes mellitus with hyperglycemia: Secondary | ICD-10-CM | POA: Diagnosis not present

## 2024-07-11 DIAGNOSIS — M6281 Muscle weakness (generalized): Secondary | ICD-10-CM | POA: Diagnosis not present

## 2024-07-11 DIAGNOSIS — G894 Chronic pain syndrome: Secondary | ICD-10-CM | POA: Diagnosis not present

## 2024-07-11 DIAGNOSIS — N179 Acute kidney failure, unspecified: Secondary | ICD-10-CM | POA: Diagnosis not present

## 2024-07-11 DIAGNOSIS — I2510 Atherosclerotic heart disease of native coronary artery without angina pectoris: Secondary | ICD-10-CM | POA: Diagnosis not present

## 2024-07-11 DIAGNOSIS — Z794 Long term (current) use of insulin: Secondary | ICD-10-CM | POA: Diagnosis not present

## 2024-07-11 DIAGNOSIS — E1140 Type 2 diabetes mellitus with diabetic neuropathy, unspecified: Secondary | ICD-10-CM | POA: Diagnosis not present

## 2024-07-11 DIAGNOSIS — E871 Hypo-osmolality and hyponatremia: Secondary | ICD-10-CM | POA: Diagnosis not present

## 2024-07-11 DIAGNOSIS — J9611 Chronic respiratory failure with hypoxia: Secondary | ICD-10-CM | POA: Diagnosis not present

## 2024-07-11 DIAGNOSIS — R600 Localized edema: Secondary | ICD-10-CM | POA: Diagnosis not present

## 2024-07-11 DIAGNOSIS — I4891 Unspecified atrial fibrillation: Secondary | ICD-10-CM | POA: Diagnosis not present

## 2024-07-11 DIAGNOSIS — S065X9A Traumatic subdural hemorrhage with loss of consciousness of unspecified duration, initial encounter: Secondary | ICD-10-CM | POA: Diagnosis not present

## 2024-07-11 DIAGNOSIS — R739 Hyperglycemia, unspecified: Secondary | ICD-10-CM | POA: Diagnosis not present

## 2024-07-11 DIAGNOSIS — R339 Retention of urine, unspecified: Secondary | ICD-10-CM | POA: Diagnosis not present

## 2024-07-11 DIAGNOSIS — Z87891 Personal history of nicotine dependence: Secondary | ICD-10-CM | POA: Diagnosis not present

## 2024-07-11 DIAGNOSIS — E782 Mixed hyperlipidemia: Secondary | ICD-10-CM | POA: Diagnosis not present

## 2024-07-11 DIAGNOSIS — S065XAD Traumatic subdural hemorrhage with loss of consciousness status unknown, subsequent encounter: Secondary | ICD-10-CM | POA: Diagnosis not present

## 2024-07-11 DIAGNOSIS — S065XAA Traumatic subdural hemorrhage with loss of consciousness status unknown, initial encounter: Secondary | ICD-10-CM | POA: Diagnosis not present

## 2024-07-11 DIAGNOSIS — D649 Anemia, unspecified: Secondary | ICD-10-CM | POA: Diagnosis not present

## 2024-07-12 DIAGNOSIS — I10 Essential (primary) hypertension: Secondary | ICD-10-CM | POA: Diagnosis not present

## 2024-07-12 DIAGNOSIS — M6281 Muscle weakness (generalized): Secondary | ICD-10-CM | POA: Diagnosis not present

## 2024-07-12 DIAGNOSIS — R2689 Other abnormalities of gait and mobility: Secondary | ICD-10-CM | POA: Diagnosis not present

## 2024-07-12 DIAGNOSIS — E785 Hyperlipidemia, unspecified: Secondary | ICD-10-CM | POA: Diagnosis not present

## 2024-07-23 DIAGNOSIS — Z9981 Dependence on supplemental oxygen: Secondary | ICD-10-CM | POA: Diagnosis not present

## 2024-07-23 DIAGNOSIS — S065X0D Traumatic subdural hemorrhage without loss of consciousness, subsequent encounter: Secondary | ICD-10-CM | POA: Diagnosis not present

## 2024-07-23 DIAGNOSIS — K219 Gastro-esophageal reflux disease without esophagitis: Secondary | ICD-10-CM | POA: Diagnosis not present

## 2024-07-23 DIAGNOSIS — I4891 Unspecified atrial fibrillation: Secondary | ICD-10-CM | POA: Diagnosis not present

## 2024-07-23 DIAGNOSIS — Z9181 History of falling: Secondary | ICD-10-CM | POA: Diagnosis not present

## 2024-07-23 DIAGNOSIS — Z6841 Body Mass Index (BMI) 40.0 and over, adult: Secondary | ICD-10-CM | POA: Diagnosis not present

## 2024-07-23 DIAGNOSIS — E1122 Type 2 diabetes mellitus with diabetic chronic kidney disease: Secondary | ICD-10-CM | POA: Diagnosis not present

## 2024-07-23 DIAGNOSIS — I129 Hypertensive chronic kidney disease with stage 1 through stage 4 chronic kidney disease, or unspecified chronic kidney disease: Secondary | ICD-10-CM | POA: Diagnosis not present

## 2024-07-23 DIAGNOSIS — N183 Chronic kidney disease, stage 3 unspecified: Secondary | ICD-10-CM | POA: Diagnosis not present

## 2024-07-23 DIAGNOSIS — D751 Secondary polycythemia: Secondary | ICD-10-CM | POA: Diagnosis not present

## 2024-07-23 DIAGNOSIS — I272 Pulmonary hypertension, unspecified: Secondary | ICD-10-CM | POA: Diagnosis not present

## 2024-07-23 DIAGNOSIS — E1165 Type 2 diabetes mellitus with hyperglycemia: Secondary | ICD-10-CM | POA: Diagnosis not present

## 2024-07-23 DIAGNOSIS — G4733 Obstructive sleep apnea (adult) (pediatric): Secondary | ICD-10-CM | POA: Diagnosis not present

## 2024-07-23 DIAGNOSIS — G8929 Other chronic pain: Secondary | ICD-10-CM | POA: Diagnosis not present

## 2024-07-23 DIAGNOSIS — N400 Enlarged prostate without lower urinary tract symptoms: Secondary | ICD-10-CM | POA: Diagnosis not present

## 2024-07-23 DIAGNOSIS — I2510 Atherosclerotic heart disease of native coronary artery without angina pectoris: Secondary | ICD-10-CM | POA: Diagnosis not present

## 2024-07-23 DIAGNOSIS — Z794 Long term (current) use of insulin: Secondary | ICD-10-CM | POA: Diagnosis not present

## 2024-07-23 DIAGNOSIS — K5000 Crohn's disease of small intestine without complications: Secondary | ICD-10-CM | POA: Diagnosis not present

## 2024-07-23 DIAGNOSIS — D631 Anemia in chronic kidney disease: Secondary | ICD-10-CM | POA: Diagnosis not present

## 2024-07-23 DIAGNOSIS — E785 Hyperlipidemia, unspecified: Secondary | ICD-10-CM | POA: Diagnosis not present

## 2024-07-25 DIAGNOSIS — I6201 Nontraumatic acute subdural hemorrhage: Secondary | ICD-10-CM | POA: Diagnosis not present

## 2024-07-25 DIAGNOSIS — Z7901 Long term (current) use of anticoagulants: Secondary | ICD-10-CM | POA: Diagnosis not present

## 2024-07-26 DIAGNOSIS — N183 Chronic kidney disease, stage 3 unspecified: Secondary | ICD-10-CM | POA: Diagnosis not present

## 2024-07-26 DIAGNOSIS — E1165 Type 2 diabetes mellitus with hyperglycemia: Secondary | ICD-10-CM | POA: Diagnosis not present

## 2024-07-26 DIAGNOSIS — S065X0D Traumatic subdural hemorrhage without loss of consciousness, subsequent encounter: Secondary | ICD-10-CM | POA: Diagnosis not present

## 2024-07-26 DIAGNOSIS — I129 Hypertensive chronic kidney disease with stage 1 through stage 4 chronic kidney disease, or unspecified chronic kidney disease: Secondary | ICD-10-CM | POA: Diagnosis not present

## 2024-07-26 DIAGNOSIS — E1122 Type 2 diabetes mellitus with diabetic chronic kidney disease: Secondary | ICD-10-CM | POA: Diagnosis not present

## 2024-07-26 DIAGNOSIS — I272 Pulmonary hypertension, unspecified: Secondary | ICD-10-CM | POA: Diagnosis not present

## 2024-07-27 DIAGNOSIS — E1165 Type 2 diabetes mellitus with hyperglycemia: Secondary | ICD-10-CM | POA: Diagnosis not present

## 2024-07-27 DIAGNOSIS — N183 Chronic kidney disease, stage 3 unspecified: Secondary | ICD-10-CM | POA: Diagnosis not present

## 2024-07-27 DIAGNOSIS — E1122 Type 2 diabetes mellitus with diabetic chronic kidney disease: Secondary | ICD-10-CM | POA: Diagnosis not present

## 2024-07-27 DIAGNOSIS — S065X0D Traumatic subdural hemorrhage without loss of consciousness, subsequent encounter: Secondary | ICD-10-CM | POA: Diagnosis not present

## 2024-07-27 DIAGNOSIS — I129 Hypertensive chronic kidney disease with stage 1 through stage 4 chronic kidney disease, or unspecified chronic kidney disease: Secondary | ICD-10-CM | POA: Diagnosis not present

## 2024-07-27 DIAGNOSIS — I272 Pulmonary hypertension, unspecified: Secondary | ICD-10-CM | POA: Diagnosis not present

## 2024-07-31 DIAGNOSIS — I272 Pulmonary hypertension, unspecified: Secondary | ICD-10-CM | POA: Diagnosis not present

## 2024-07-31 DIAGNOSIS — I129 Hypertensive chronic kidney disease with stage 1 through stage 4 chronic kidney disease, or unspecified chronic kidney disease: Secondary | ICD-10-CM | POA: Diagnosis not present

## 2024-07-31 DIAGNOSIS — E1122 Type 2 diabetes mellitus with diabetic chronic kidney disease: Secondary | ICD-10-CM | POA: Diagnosis not present

## 2024-07-31 DIAGNOSIS — N183 Chronic kidney disease, stage 3 unspecified: Secondary | ICD-10-CM | POA: Diagnosis not present

## 2024-07-31 DIAGNOSIS — E1165 Type 2 diabetes mellitus with hyperglycemia: Secondary | ICD-10-CM | POA: Diagnosis not present

## 2024-07-31 DIAGNOSIS — S065X0D Traumatic subdural hemorrhage without loss of consciousness, subsequent encounter: Secondary | ICD-10-CM | POA: Diagnosis not present

## 2024-08-01 DIAGNOSIS — S065XAD Traumatic subdural hemorrhage with loss of consciousness status unknown, subsequent encounter: Secondary | ICD-10-CM | POA: Diagnosis not present

## 2024-08-01 DIAGNOSIS — S065XAA Traumatic subdural hemorrhage with loss of consciousness status unknown, initial encounter: Secondary | ICD-10-CM | POA: Diagnosis not present

## 2024-08-07 DIAGNOSIS — N183 Chronic kidney disease, stage 3 unspecified: Secondary | ICD-10-CM | POA: Diagnosis not present

## 2024-08-07 DIAGNOSIS — E1165 Type 2 diabetes mellitus with hyperglycemia: Secondary | ICD-10-CM | POA: Diagnosis not present

## 2024-08-07 DIAGNOSIS — I272 Pulmonary hypertension, unspecified: Secondary | ICD-10-CM | POA: Diagnosis not present

## 2024-08-07 DIAGNOSIS — S065X0D Traumatic subdural hemorrhage without loss of consciousness, subsequent encounter: Secondary | ICD-10-CM | POA: Diagnosis not present

## 2024-08-07 DIAGNOSIS — I129 Hypertensive chronic kidney disease with stage 1 through stage 4 chronic kidney disease, or unspecified chronic kidney disease: Secondary | ICD-10-CM | POA: Diagnosis not present

## 2024-08-07 DIAGNOSIS — E1122 Type 2 diabetes mellitus with diabetic chronic kidney disease: Secondary | ICD-10-CM | POA: Diagnosis not present

## 2024-08-13 DIAGNOSIS — Z1330 Encounter for screening examination for mental health and behavioral disorders, unspecified: Secondary | ICD-10-CM | POA: Diagnosis not present

## 2024-08-13 DIAGNOSIS — S065XAA Traumatic subdural hemorrhage with loss of consciousness status unknown, initial encounter: Secondary | ICD-10-CM | POA: Diagnosis not present

## 2024-08-14 DIAGNOSIS — I272 Pulmonary hypertension, unspecified: Secondary | ICD-10-CM | POA: Diagnosis not present

## 2024-08-14 DIAGNOSIS — E1165 Type 2 diabetes mellitus with hyperglycemia: Secondary | ICD-10-CM | POA: Diagnosis not present

## 2024-08-14 DIAGNOSIS — I129 Hypertensive chronic kidney disease with stage 1 through stage 4 chronic kidney disease, or unspecified chronic kidney disease: Secondary | ICD-10-CM | POA: Diagnosis not present

## 2024-08-14 DIAGNOSIS — E1122 Type 2 diabetes mellitus with diabetic chronic kidney disease: Secondary | ICD-10-CM | POA: Diagnosis not present

## 2024-08-14 DIAGNOSIS — N183 Chronic kidney disease, stage 3 unspecified: Secondary | ICD-10-CM | POA: Diagnosis not present

## 2024-08-14 DIAGNOSIS — S065X0D Traumatic subdural hemorrhage without loss of consciousness, subsequent encounter: Secondary | ICD-10-CM | POA: Diagnosis not present

## 2024-08-22 DIAGNOSIS — E1122 Type 2 diabetes mellitus with diabetic chronic kidney disease: Secondary | ICD-10-CM | POA: Diagnosis not present

## 2024-08-22 DIAGNOSIS — S065X0D Traumatic subdural hemorrhage without loss of consciousness, subsequent encounter: Secondary | ICD-10-CM | POA: Diagnosis not present

## 2024-08-22 DIAGNOSIS — G4733 Obstructive sleep apnea (adult) (pediatric): Secondary | ICD-10-CM | POA: Diagnosis not present

## 2024-08-22 DIAGNOSIS — E785 Hyperlipidemia, unspecified: Secondary | ICD-10-CM | POA: Diagnosis not present

## 2024-08-22 DIAGNOSIS — G8929 Other chronic pain: Secondary | ICD-10-CM | POA: Diagnosis not present

## 2024-08-22 DIAGNOSIS — D631 Anemia in chronic kidney disease: Secondary | ICD-10-CM | POA: Diagnosis not present

## 2024-08-22 DIAGNOSIS — K5000 Crohn's disease of small intestine without complications: Secondary | ICD-10-CM | POA: Diagnosis not present

## 2024-08-22 DIAGNOSIS — I2510 Atherosclerotic heart disease of native coronary artery without angina pectoris: Secondary | ICD-10-CM | POA: Diagnosis not present

## 2024-08-22 DIAGNOSIS — D751 Secondary polycythemia: Secondary | ICD-10-CM | POA: Diagnosis not present

## 2024-08-22 DIAGNOSIS — I129 Hypertensive chronic kidney disease with stage 1 through stage 4 chronic kidney disease, or unspecified chronic kidney disease: Secondary | ICD-10-CM | POA: Diagnosis not present

## 2024-08-22 DIAGNOSIS — I4891 Unspecified atrial fibrillation: Secondary | ICD-10-CM | POA: Diagnosis not present

## 2024-08-22 DIAGNOSIS — E1165 Type 2 diabetes mellitus with hyperglycemia: Secondary | ICD-10-CM | POA: Diagnosis not present

## 2024-08-22 DIAGNOSIS — Z794 Long term (current) use of insulin: Secondary | ICD-10-CM | POA: Diagnosis not present

## 2024-08-22 DIAGNOSIS — Z9181 History of falling: Secondary | ICD-10-CM | POA: Diagnosis not present

## 2024-08-22 DIAGNOSIS — N183 Chronic kidney disease, stage 3 unspecified: Secondary | ICD-10-CM | POA: Diagnosis not present

## 2024-08-22 DIAGNOSIS — Z9981 Dependence on supplemental oxygen: Secondary | ICD-10-CM | POA: Diagnosis not present

## 2024-08-22 DIAGNOSIS — N400 Enlarged prostate without lower urinary tract symptoms: Secondary | ICD-10-CM | POA: Diagnosis not present

## 2024-08-22 DIAGNOSIS — K219 Gastro-esophageal reflux disease without esophagitis: Secondary | ICD-10-CM | POA: Diagnosis not present

## 2024-08-22 DIAGNOSIS — I272 Pulmonary hypertension, unspecified: Secondary | ICD-10-CM | POA: Diagnosis not present

## 2024-08-22 DIAGNOSIS — Z6841 Body Mass Index (BMI) 40.0 and over, adult: Secondary | ICD-10-CM | POA: Diagnosis not present

## 2024-08-23 DIAGNOSIS — M5416 Radiculopathy, lumbar region: Secondary | ICD-10-CM | POA: Diagnosis not present

## 2024-08-23 DIAGNOSIS — Z79899 Other long term (current) drug therapy: Secondary | ICD-10-CM | POA: Diagnosis not present

## 2024-08-23 DIAGNOSIS — M961 Postlaminectomy syndrome, not elsewhere classified: Secondary | ICD-10-CM | POA: Diagnosis not present

## 2024-08-23 DIAGNOSIS — I272 Pulmonary hypertension, unspecified: Secondary | ICD-10-CM | POA: Diagnosis not present

## 2024-08-23 DIAGNOSIS — S065XAA Traumatic subdural hemorrhage with loss of consciousness status unknown, initial encounter: Secondary | ICD-10-CM | POA: Diagnosis not present

## 2024-08-27 DIAGNOSIS — F17210 Nicotine dependence, cigarettes, uncomplicated: Secondary | ICD-10-CM | POA: Diagnosis not present

## 2024-08-27 DIAGNOSIS — R918 Other nonspecific abnormal finding of lung field: Secondary | ICD-10-CM | POA: Diagnosis not present

## 2024-08-27 DIAGNOSIS — Z23 Encounter for immunization: Secondary | ICD-10-CM | POA: Diagnosis not present

## 2024-08-28 DIAGNOSIS — J9611 Chronic respiratory failure with hypoxia: Secondary | ICD-10-CM | POA: Diagnosis not present

## 2024-08-28 DIAGNOSIS — G4733 Obstructive sleep apnea (adult) (pediatric): Secondary | ICD-10-CM | POA: Diagnosis not present

## 2024-08-28 DIAGNOSIS — I2721 Secondary pulmonary arterial hypertension: Secondary | ICD-10-CM | POA: Diagnosis not present

## 2024-09-04 DIAGNOSIS — E1143 Type 2 diabetes mellitus with diabetic autonomic (poly)neuropathy: Secondary | ICD-10-CM | POA: Diagnosis not present

## 2024-09-04 DIAGNOSIS — N529 Male erectile dysfunction, unspecified: Secondary | ICD-10-CM | POA: Diagnosis not present

## 2024-09-04 DIAGNOSIS — I6201 Nontraumatic acute subdural hemorrhage: Secondary | ICD-10-CM | POA: Diagnosis not present

## 2024-09-04 DIAGNOSIS — I270 Primary pulmonary hypertension: Secondary | ICD-10-CM | POA: Diagnosis not present

## 2024-09-04 DIAGNOSIS — N1831 Chronic kidney disease, stage 3a: Secondary | ICD-10-CM | POA: Diagnosis not present

## 2024-09-04 DIAGNOSIS — E1122 Type 2 diabetes mellitus with diabetic chronic kidney disease: Secondary | ICD-10-CM | POA: Diagnosis not present

## 2024-09-04 DIAGNOSIS — K861 Other chronic pancreatitis: Secondary | ICD-10-CM | POA: Diagnosis not present

## 2024-09-04 DIAGNOSIS — J9611 Chronic respiratory failure with hypoxia: Secondary | ICD-10-CM | POA: Diagnosis not present

## 2024-09-04 DIAGNOSIS — K5010 Crohn's disease of large intestine without complications: Secondary | ICD-10-CM | POA: Diagnosis not present

## 2024-09-05 DIAGNOSIS — S065X0D Traumatic subdural hemorrhage without loss of consciousness, subsequent encounter: Secondary | ICD-10-CM | POA: Diagnosis not present

## 2024-09-05 DIAGNOSIS — I129 Hypertensive chronic kidney disease with stage 1 through stage 4 chronic kidney disease, or unspecified chronic kidney disease: Secondary | ICD-10-CM | POA: Diagnosis not present

## 2024-09-05 DIAGNOSIS — E1165 Type 2 diabetes mellitus with hyperglycemia: Secondary | ICD-10-CM | POA: Diagnosis not present

## 2024-09-05 DIAGNOSIS — N183 Chronic kidney disease, stage 3 unspecified: Secondary | ICD-10-CM | POA: Diagnosis not present

## 2024-09-05 DIAGNOSIS — E1122 Type 2 diabetes mellitus with diabetic chronic kidney disease: Secondary | ICD-10-CM | POA: Diagnosis not present

## 2024-09-05 DIAGNOSIS — I272 Pulmonary hypertension, unspecified: Secondary | ICD-10-CM | POA: Diagnosis not present

## 2024-09-06 DIAGNOSIS — N183 Chronic kidney disease, stage 3 unspecified: Secondary | ICD-10-CM | POA: Diagnosis not present

## 2024-09-06 DIAGNOSIS — E1165 Type 2 diabetes mellitus with hyperglycemia: Secondary | ICD-10-CM | POA: Diagnosis not present

## 2024-09-06 DIAGNOSIS — E1122 Type 2 diabetes mellitus with diabetic chronic kidney disease: Secondary | ICD-10-CM | POA: Diagnosis not present

## 2024-09-06 DIAGNOSIS — I272 Pulmonary hypertension, unspecified: Secondary | ICD-10-CM | POA: Diagnosis not present

## 2024-09-06 DIAGNOSIS — S065X0D Traumatic subdural hemorrhage without loss of consciousness, subsequent encounter: Secondary | ICD-10-CM | POA: Diagnosis not present

## 2024-09-06 DIAGNOSIS — I129 Hypertensive chronic kidney disease with stage 1 through stage 4 chronic kidney disease, or unspecified chronic kidney disease: Secondary | ICD-10-CM | POA: Diagnosis not present

## 2024-10-02 DIAGNOSIS — M5416 Radiculopathy, lumbar region: Secondary | ICD-10-CM | POA: Diagnosis not present

## 2024-10-27 DIAGNOSIS — N179 Acute kidney failure, unspecified: Secondary | ICD-10-CM | POA: Diagnosis not present

## 2024-10-27 DIAGNOSIS — S0990XA Unspecified injury of head, initial encounter: Secondary | ICD-10-CM | POA: Diagnosis not present

## 2024-10-27 DIAGNOSIS — K219 Gastro-esophageal reflux disease without esophagitis: Secondary | ICD-10-CM | POA: Diagnosis not present

## 2024-10-27 DIAGNOSIS — I517 Cardiomegaly: Secondary | ICD-10-CM | POA: Diagnosis not present

## 2024-10-27 DIAGNOSIS — E1165 Type 2 diabetes mellitus with hyperglycemia: Secondary | ICD-10-CM | POA: Diagnosis not present

## 2024-10-27 DIAGNOSIS — E1122 Type 2 diabetes mellitus with diabetic chronic kidney disease: Secondary | ICD-10-CM | POA: Diagnosis not present

## 2024-10-27 DIAGNOSIS — I3139 Other pericardial effusion (noninflammatory): Secondary | ICD-10-CM | POA: Diagnosis not present

## 2024-10-27 DIAGNOSIS — J90 Pleural effusion, not elsewhere classified: Secondary | ICD-10-CM | POA: Diagnosis not present

## 2024-10-27 DIAGNOSIS — I959 Hypotension, unspecified: Secondary | ICD-10-CM | POA: Diagnosis not present

## 2024-10-27 DIAGNOSIS — E872 Acidosis, unspecified: Secondary | ICD-10-CM | POA: Diagnosis not present

## 2024-10-27 DIAGNOSIS — N1831 Chronic kidney disease, stage 3a: Secondary | ICD-10-CM | POA: Diagnosis not present

## 2024-10-27 DIAGNOSIS — R55 Syncope and collapse: Secondary | ICD-10-CM | POA: Diagnosis not present

## 2024-10-27 DIAGNOSIS — W19XXXA Unspecified fall, initial encounter: Secondary | ICD-10-CM | POA: Diagnosis not present

## 2024-10-27 DIAGNOSIS — D649 Anemia, unspecified: Secondary | ICD-10-CM | POA: Diagnosis not present

## 2024-10-27 DIAGNOSIS — J984 Other disorders of lung: Secondary | ICD-10-CM | POA: Diagnosis not present

## 2024-10-27 DIAGNOSIS — R0902 Hypoxemia: Secondary | ICD-10-CM | POA: Diagnosis not present

## 2024-10-27 DIAGNOSIS — I272 Pulmonary hypertension, unspecified: Secondary | ICD-10-CM | POA: Diagnosis not present

## 2024-10-27 DIAGNOSIS — Z794 Long term (current) use of insulin: Secondary | ICD-10-CM | POA: Diagnosis not present

## 2024-10-27 DIAGNOSIS — R531 Weakness: Secondary | ICD-10-CM | POA: Diagnosis not present

## 2024-10-27 DIAGNOSIS — J9611 Chronic respiratory failure with hypoxia: Secondary | ICD-10-CM | POA: Diagnosis not present

## 2024-10-27 DIAGNOSIS — I2584 Coronary atherosclerosis due to calcified coronary lesion: Secondary | ICD-10-CM | POA: Diagnosis not present

## 2024-10-27 DIAGNOSIS — R110 Nausea: Secondary | ICD-10-CM | POA: Diagnosis not present

## 2024-10-27 DIAGNOSIS — R296 Repeated falls: Secondary | ICD-10-CM | POA: Diagnosis not present

## 2024-10-27 DIAGNOSIS — R0689 Other abnormalities of breathing: Secondary | ICD-10-CM | POA: Diagnosis not present

## 2024-10-27 DIAGNOSIS — Z043 Encounter for examination and observation following other accident: Secondary | ICD-10-CM | POA: Diagnosis not present

## 2024-10-27 DIAGNOSIS — G4733 Obstructive sleep apnea (adult) (pediatric): Secondary | ICD-10-CM | POA: Diagnosis not present

## 2024-10-27 DIAGNOSIS — Z79899 Other long term (current) drug therapy: Secondary | ICD-10-CM | POA: Diagnosis not present

## 2024-10-27 DIAGNOSIS — I129 Hypertensive chronic kidney disease with stage 1 through stage 4 chronic kidney disease, or unspecified chronic kidney disease: Secondary | ICD-10-CM | POA: Diagnosis not present

## 2024-10-27 DIAGNOSIS — D6861 Antiphospholipid syndrome: Secondary | ICD-10-CM | POA: Diagnosis not present

## 2024-10-27 DIAGNOSIS — I2510 Atherosclerotic heart disease of native coronary artery without angina pectoris: Secondary | ICD-10-CM | POA: Diagnosis not present

## 2024-10-27 DIAGNOSIS — Z9981 Dependence on supplemental oxygen: Secondary | ICD-10-CM | POA: Diagnosis not present

## 2024-10-28 DIAGNOSIS — R55 Syncope and collapse: Secondary | ICD-10-CM | POA: Diagnosis not present

## 2024-10-28 DIAGNOSIS — I517 Cardiomegaly: Secondary | ICD-10-CM | POA: Diagnosis not present

## 2024-10-28 DIAGNOSIS — Z452 Encounter for adjustment and management of vascular access device: Secondary | ICD-10-CM | POA: Diagnosis not present

## 2024-10-28 DIAGNOSIS — R0989 Other specified symptoms and signs involving the circulatory and respiratory systems: Secondary | ICD-10-CM | POA: Diagnosis not present

## 2024-10-28 DIAGNOSIS — J984 Other disorders of lung: Secondary | ICD-10-CM | POA: Diagnosis not present

## 2024-10-29 DIAGNOSIS — R296 Repeated falls: Secondary | ICD-10-CM | POA: Diagnosis not present

## 2024-10-29 DIAGNOSIS — R918 Other nonspecific abnormal finding of lung field: Secondary | ICD-10-CM | POA: Diagnosis not present

## 2024-10-29 DIAGNOSIS — S0990XA Unspecified injury of head, initial encounter: Secondary | ICD-10-CM | POA: Diagnosis not present

## 2024-10-31 DIAGNOSIS — R0602 Shortness of breath: Secondary | ICD-10-CM | POA: Diagnosis not present

## 2024-10-31 DIAGNOSIS — J90 Pleural effusion, not elsewhere classified: Secondary | ICD-10-CM | POA: Diagnosis not present

## 2024-10-31 DIAGNOSIS — J439 Emphysema, unspecified: Secondary | ICD-10-CM | POA: Diagnosis not present

## 2024-11-09 DIAGNOSIS — I50812 Chronic right heart failure: Secondary | ICD-10-CM | POA: Diagnosis not present

## 2024-11-09 DIAGNOSIS — N1831 Chronic kidney disease, stage 3a: Secondary | ICD-10-CM | POA: Diagnosis not present

## 2024-11-09 DIAGNOSIS — I469 Cardiac arrest, cause unspecified: Secondary | ICD-10-CM | POA: Diagnosis not present

## 2024-11-09 DIAGNOSIS — I2510 Atherosclerotic heart disease of native coronary artery without angina pectoris: Secondary | ICD-10-CM | POA: Diagnosis not present

## 2024-11-09 DIAGNOSIS — Z6841 Body Mass Index (BMI) 40.0 and over, adult: Secondary | ICD-10-CM | POA: Diagnosis not present

## 2024-11-09 DIAGNOSIS — N179 Acute kidney failure, unspecified: Secondary | ICD-10-CM | POA: Diagnosis not present

## 2024-11-09 DIAGNOSIS — I2721 Secondary pulmonary arterial hypertension: Secondary | ICD-10-CM | POA: Diagnosis not present

## 2024-11-09 DIAGNOSIS — D6861 Antiphospholipid syndrome: Secondary | ICD-10-CM | POA: Diagnosis not present

## 2024-11-09 DIAGNOSIS — J9611 Chronic respiratory failure with hypoxia: Secondary | ICD-10-CM | POA: Diagnosis not present

## 2024-11-10 NOTE — Discharge Summary (Addendum)
 [facility]  Discharge Summary  PCP: ORPHA YANCEY LABOR, MD Discharge Details   Admit date:         10/28/2024 Discharge date and time:       11/23/24 Hospital LOS:    27  days  Active Hospital Problems   Diagnosis Date Noted POA   *Cardiac arrest (*) 10/28/2024 Yes   Syncope and collapse 10/27/2024 Yes   Pulmonary hypertension (*) 10/27/2024 Unknown   Type 2 diabetes mellitus with neurologic complication, with long-term current use of insulin  (*) 07/03/2024 Not Applicable   Morbid obesity with BMI of 40.0-44.9, adult (*) 07/03/2024 Not Applicable   Chronic respiratory failure with hypoxia (*) 03/29/2023 Yes   Stage 3a chronic kidney disease (*) 12/08/2022 Yes   Pulmonary arterial hypertension (*) 07/13/2022 Yes   Dyslipidemia 10/06/2021 Yes   Coronary artery disease involving native coronary artery of native heart without angina pectoris 12/02/2020 Yes   BiPAP (biphasic positive airway pressure) dependence 11/12/2020 Not Applicable   Chronic right-sided heart failure (*) 11/12/2020 Yes   Antiphospholipid syndrome (*) 02/07/2020 Yes   Obstructive sleep apnea syndrome 01/03/2020 Yes   Crohn's disease (*) 05/29/2015 Yes    Resolved Hospital Problems  No resolved problems to display.      Current Discharge Medication List     PAUSE taking these medications as directed      Details  JARDIANCE 25 mg Tabs tablet Wait to take this until your doctor or other care provider tells you to start again. Generic drug: empagliflozin  Take one tablet (25 mg dose) by mouth daily. Pause reason: Other (Comment) Pause comment: Hold until follow up with CHF team.       START taking these medications      Details  acetaminophen  500 mg tablet Commonly known as: TYLENOL   Take two tablets (1,000 mg dose) by mouth every 8 (eight) hours as needed Quantity: 30 tablet   bisacodyl  10 mg suppository Commonly known as:  BISCOLAX,DULCOLAX  Place one suppository (10 mg dose) rectally daily as needed Quantity: 12 suppository   bumetanide 2 mg tablet Commonly known as: BUMEX  Take one tablet (2 mg dose) by mouth twice daily (6am and 2pm) Quantity: 60 tablet   dextromethorphan-guaiFENesin 30-600 mg per 12 hr tablet Commonly known as: MUCINEX DM  Take one tablet by mouth every 12 (twelve) hours as needed for up to 10 days. Quantity: 20 tablet   fluticasone  propionate 50 mcg/actuation nasal spray Commonly known as: FLONASE  Start taking on: November 24, 2024  use one spray in both nostrils daily. Quantity: 16 g   gabapentin  400 mg capsule Commonly known as: NEURONTIN  Replaces: gabapentin  600 mg tablet  Take one capsule (400 mg dose) by mouth 4 (four) times daily. Quantity: 120 capsule   hydrOXYzine  HCl 25 mg tablet Commonly known as: ATARAX   Take one tablet (25 mg dose) by mouth 3 (three) times a day as needed for Itching or Anxiety for up to 10 days. Quantity: 30 tablet   lidocaine  4% patch 4 % Start taking on: November 24, 2024  Place one patch onto the skin once daily at the same time. Remove & Discard patch within 12 hours or as directed by MD Quantity: 30 patch   morphine  sulfate ER 15 mg 12 hr tablet Commonly known as: MS CONTIN   Take one tablet (15 mg dose) by mouth 2 (two) times daily for 30 days Quantity: 60 tablet   ondansetron  4 mg  tablet Commonly known as: ZOFRAN   Take one tablet (4 mg dose) by mouth every 4 (four) hours as needed Quantity: 20 tablet   oxyCODONE  HCl 10 mg immediate release tablet Commonly known as: ROXICODONE   Take one tablet (10 mg dose) by mouth every 6 (six) hours as needed for up to 14 days. Quantity: 56 tablet   polyethylene glycol 17 g packet Commonly known as: MIRALAX   Dissolve 1 packet (17g) in liquid and drink by mouth 2 (two) times a day as needed. Quantity: 60 each   sennosides-docusate sodium  8.6-50 mg per tablet Commonly known as: SENOKOT-S   Take two tablets by mouth daily as needed for Constipation. Quantity: 30 tablet   spironolactone 25 mg tablet Commonly known as: ALDACTONE Start taking on: November 24, 2024  Take one tablet (25 mg dose) by mouth daily. Quantity: 30 tablet       CONTINUE these medications which have CHANGED      Details  apixaban 5 mg tablet Commonly known as: ELIQUIS What changed:  medication strength how much to take  Take one tablet (5 mg dose) by mouth 2 (two) times daily. Indication: Antiphospholipid syndrome Quantity: 60 tablet       CONTINUE these medications which have NOT CHANGED      Details  Cholecalciferol 50 MCG (2000 UT) Tabs  Take one tablet by mouth daily.   CREON  24000 units DR capsule Generic drug: pancrelipase  (lipase-protease-amylase)  Take two capsules (48,000 Units dose) by mouth 3 (three) times daily with meals.   DULoxetine  HCl 30 mg capsule Commonly known as: CYMBALTA   Take one capsule (30 mg dose) by mouth 2 (two) times daily.   ezetimibe 10 MG tablet Commonly known as: ZETIA  Take one tablet (10 mg dose) by mouth every morning.   * insulin  lispro (HUMALOG ,ADMELOG ) 100 Units/mL injection  Inject two Units to eight Units into the skin mealtime and at bedtime. Goal: administer insulin  coverage within 60 minutes of POCT glucose result  Do not administer correction (Sliding Scale) insulin  dose if within 3 hours of prior correction dose  Scale 3 - Resistance (1:30 Scale)   BG 151-210 - administer 2 units BG 211-240 - administer 3 units BG 241-270 - administer 4 units BG 271-300 - administer 5 units BG 301-330 - administer 6 units BG 331-360 - administer 7 units BG 361-399 - administer 8 units BG 400 or greater - call provider  If BOLUS (Mealtime) and correction (sliding scale) ordered: Administer BOLUS (Mealtime) insulin  PLUS correction (sliding scale) dose insulin  with meal.   * insulin  lispro (HUMALOG ,ADMELOG ) 100 Units/mL injection  Inject into  the skin. Uses for pump failure as well. Max daily units 125   INSULIN  PUMP FROM HOME  by Insulin  Pump route continuous.   levocetirizine 5 mg tablet Commonly known as: XYZAL   Take one tablet (5 mg dose) by mouth every evening.   levothyroxine sodium 25 mcg tablet Commonly known as: SYNTHROID,LOVOTHROID,LEVOXYL  Take one tablet (25 mcg dose) by mouth daily.   metformin  1000 MG tablet Commonly known as: GLUCOPHAGE   Take one tablet (1,000 mg dose) by mouth 2 (two) times daily.   naloxone nasal spray (TAKE HOME PACK) Commonly known as: NARCAN  one spray by Intranasal route as needed for Opioid Reversal.   omega-3 acid ethyl esters 1 g capsule Commonly known as: LOVAZA  Take two capsules (2 g dose) by mouth 2 (two) times daily.   OMNIPOD DASH PODS (GEN 4) Misc  SMARTSIG:SUB-Q Every Other Day  OPSUMIT 10 MG tablet Generic drug: macitentan  Take one tablet (10 mg dose) by mouth daily.   ORENITRAM 1 mg ER tablet Generic drug: treprostinil diolamine  Take two tablets (2 mg dose) by mouth 3 (three) times a day.   pravastatin sodium 20 mg tablet Commonly known as: PRAVACHOL  Take one tablet (20 mg dose) by mouth daily.   tadalafil 20 MG tablet Commonly known as: CIALIS  Take two tablets (40 mg dose) by mouth every morning.   tamsulosin  0.4 mg Caps Commonly known as: FLOMAX   Take one capsule (0.4 mg dose) by mouth every morning.   testosterone  1.62% topical gel Commonly known as: ANDROGEL  PUMP  Place two Pump onto the skin daily.      * * This list has 2 medication(s) that are the same as other medications prescribed for you. Read the directions carefully, and ask your doctor or other care provider to review them with you.         * You might also be taking other medications not listed above. If you have questions about any of your other medications, talk to the person who prescribed them or your Primary Care Provider.          STOP taking these medications     furosemide 40 mg tablet Commonly known as: LASIX   gabapentin  600 mg tablet Commonly known as: NEURONTIN  Replaced by: gabapentin  400 mg capsule   metoprolol tartrate 25 mg tablet Commonly known as: LOPRESSOR   oxyCODONE -acetaminophen  10-325 mg per tablet Commonly known as: PERCOCET,ENDOCET   potassium chloride 10 mEq CR tablet Commonly known as: KLOR-CON        Reason for medication changes:  Hospital Course   Indication for Admission/chief complaint: Syncope and shortness of breath  Hospital Course:        Billal Sharolyn Garms is a 78 year old Caucasian male with past medical history of chronic hypoxic respiratory failure secondary to pulmonary arterial hypertension and wears 4 L supplemental nasal cannula oxygen  at baseline, obstructive sleep apnea with CPAP at nights, antiphospholipid antibody syndrome on chronic anticoagulation (Eliquis) history of pancreatitis, CKD stage IIIa who presents initially presents to [facility] with chief complaints of syncope and shortness of breath he stated he had a similar episode a week ago but today is feeling more short of breath than usual in the ED evaluation he underwent CT of the head without contrast CT of the chest without contrast and CT of the facial bones.  CTs revealed no fractures.  CT of the chest showed small right-sided pleural effusion with small to moderate pericardial effusion.  Had another episode of syncope at [facility] ER while walking from the bathroom and was found to have PEA with sinus bradycardia received 5 minutes of CPR with ROSC achieved and patient was able to protect his airway and he was transferred to [facility] CCU for further management by cardiology.   Per chart review, soon after arrival to CCU at [facility] he was found to have bradycardic while lying in bed and conversing with staff he became unresponsive after his heart rate dropped into the 40s  with no palpable pulse detected. CPR was initiated and ROSC was achieved after about 3 to 4 minutes of CPR.  After CPR he was neurologically intact and was able to protect his airway.  The second episodes of PEA was thought to be due to sinus bradycardia.  Patient was not intubated during his hospital course  in the CCU.  After his second episode of PEA which was thought to be due to bradycardia cardiology discussed with family about transvenous pacing which was started.  He also went into atrial fibrillation with RVR was treated with amiodarone.  For his acute on chronic hypoxic respiratory failure he required BiPAP.  Was initially on high flow oxygen  at 10 L a minute and subsequently trended down to 7 L/min.  Due to his diagnosis of pulmonary hypertension and right-sided heart failure heart failure service was consulted.  He was taken to the Cath Lab due to severe pulmonary hypertension but his oxygen  saturation dropped (85 to 86%) and he experienced severe pain so the cardiologist elected to cancel the case.  After he returned to CCU Swan-Ganz was initiated to evaluate his right-sided heart failure and pulmonary arterial hypertension pressure He was transferred out to the floor on 12/11  On 12/12-started to have increased edema to bilateral lower extremities.  On 12/13 bilateral peripheral edema worsened 3+ he was switched from p.o. Lasix to IV Lasix x 2 and then de-escalated to p.o. Lasix twice daily.  Despite swelling of his legs his NT proBNP was normal.  Spoke to cardiology on-call who states he will place patient's name on the heart failure service consult who will see him on Monday 12/15.  During his course in ICU he required BiPAP and high flow oxygen .  Also required NIV BUT once his breathing improved he has refused BiPAP.  He does have a home machine but the settings are incorrect and states he needs to have a sleep study after discharge to have a new BiPAP machine  # Syncope/Collapse #  Bradycardia to PEA Arrest x 2.  Thought to be 2/2 to hypoxia related to [facility].  TVP removed 10/30/24.  Avoiding AV nodal blockade.  TTE 11/02/24 - nl EF, trace MR, trace TR, significantly elevated RVSP, indeterminate diastolic function.  Palliative care saw in house and pt remains full code.    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  Attempted RHC earlier in admission and had to be aborted 2/2 to worsening status during procedure.  EP was seeing in house.  Advanced HF saw in house.  Pt will continue tadalafil + treprostinil + macitentan.  Per recommendations from CHF team patient will be on Bumex 2 mg p.o. twice daily dosing as well.  Recommended holding Jardiance until outpatient follow-up in the CHF clinic which has been set up for 11/27/2024.  I have also asked for care connect arrange follow-up with PCP within 7 days.  Patient requires 6 L of oxygen  at rest and 10 L with exertion.  Home health company [employer] contacted and will help arrange for higher oxygen  concentrator at discharge.  CM assisted with this.  Pal care team also followed during admission and help with his pain regimen.  Patient was put on OxyContin  20 mg p.o. twice daily as well as oxycodone  10 mg as needed for pain.  Home gabapentin  dose was reduced.  Patient also has a lido patch and as needed Tylenol .  Patient also on Cymbalta .  Will discharge patient on this regimen.  He has requested medication to be sent to the [facility] [place].  Overall poor prognosis with high risk for recurrent decompensation and hospital admissions. Initial plan was for patient to go to [facility] but based on discussion with patient and his wife they want to go home with  home health which has been ordered. Addendum: Patient's insurance will not cover the OxyContin  20 mg p.o. twice daily so this was switched to MS Contin  ER 15 mg p.o. twice daily.   # Chest Pain s/p CPR.  Pain after CPR continues.   Treat pain as above.   # Abdominal Pain.  Believe this is MSK pain also related to CPR.  Lipase negative KUB unremarkable.  CT abd/pelvis 11/16/24 without acute findings to explain abdominal pain - some LAD that is nonspecific   # Fever around 0001 AM 11/15/24.  # Concern for Complicated Klebsiella UTI.  RVP negative.  Pt completed 5 day course abx   # pAF w/ RVR.  Pt was continued on apixaban.  No beta-blocker continued at discharge.   # Antiphospholipid Ab Syndrome.  Pt was continued on apixaban.   # AKI on CKD3a.  Cr at baseline.  Recommend BMP at follow-up with PCP.   # DM. Pt was managed on insulin  pump from home.  Patient also noted to be on metformin  at home which will be continued although not clear he needs to continue given he is on insulin  pump.  Further management per his PCP.   # Chronic Pancreatitis.  Pt was continued on home creon .  See above for pain regimen.  Patient will also be on bowel regimen.   # OSA.  Cont BiPAP qhs.  Patient was not using the BiPAP we have here as it did not fit properly.  He had his home unit at the hospital but was not using.  I encouraged him to do so as with diuresis his respiratory status is improving.  Patient is on 20/15 at home which we will continue.  He will need outpatient sleep study.  Patient and wife already aware of this.   # CAD # HLD.  Pt was continued on home zetia + statin   # Chrohn's Dz.  No acute complaints   # Morbid Obesity.  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  Now changed to DO[facility]  Recommendations to physicians/followup needed: Please reassess vitals, electrolytes, volume status at follow-up appointment and adjust as needed.  Patient has follow-up with CHF clinic on 12/30.  Someone to call with follow-up with PCP within 7 days.   Physical Exam: Vitals:   11/23/24 1135  BP:   Pulse:   Resp:   Temp:   SpO2: 95%     Labs on Discharge:  Recent Labs    Units  11/20/24 0220 11/19/24 0236 11/18/24 0058 11/17/24 0039  WBC thou/mcL 7.4 8.0 9.8 8.9  HGB gm/dL 8.7* 9.3* 9.3* 9.0*  HCT % 29.8* 32.0* 31.6* 30.9*  PLT thou/mcL 248 258 283 285   Recent Labs    Units 11/23/24 0133 11/22/24 0432 11/21/24 0137 11/20/24 0220 11/19/24 0236 11/18/24 0058 11/17/24 0039  NA mmol/L 139 140 140 138 139 136 137  K mmol/L 4.7 3.7 3.6* 3.6* 3.9 4.2 4.2  CL mmol/L 97 97 99 98 98 95* 96*  CO2 mmol/L 35* 31 29 29  33* 32 34*  BUN mg/dL 19 18 19 18 22 24 23   CREATININE mg/dL 8.69* 8.79 8.75 8.76 8.79 1.17 1.20  CALCIUM mg/dL 8.9 8.9 9.0 8.8 9.2 9.0 8.9  PHOS mg/dL  --   --   --  3.1  --  3.0 3.2   Recent Labs    Units 11/20/24 0220 11/19/24 0236 11/18/24 0058 11/17/24 0039  BILITOT  mg/dL 0.2 0.2 <9.7 0.2  AST U/L 14 21 20 28   ALT U/L 12 17 14 14   ALKPHOS U/L 82 86 92 88  ALBUMIN gm/dL 3.3* 3.4* 3.3* 3.4*   No results for input(s): TSH, HGBA1C in the last 168 hours. Recent Labs    Units 11/20/24 0220  INR  1.4   No results for input(s): CHOL, LDL, HDL, TRIG in the last 168 hours. No results for input(s): TROPONIN, CK in the last 168 hours.  Invalid input(s): CK-MB  Diagnostics:  XR Chest Ap Portable  Final Result  IMPRESSION:  1. Stable cardiomegaly    Electronically Signed by: Glendia Guillaume, MD on 11/22/2024 9:30 AM    XR Chest Ap Portable  Final Result  IMPRESSION:    Small pleural effusions with diffuse bilateral infiltrates which may represent pulmonary edema versus bilateral pneumonia.    Enlarged cardiac silhouette which may represent cardiomegaly versus pericardial effusion    Electronically Signed by: Marinda Fleming, MD on 11/18/2024 6:48 AM    CT Abdomen Pelvis WO IV Contrast  Final Result  IMPRESSION:  1.  No acute findings in the abdomen/pelvis.  2.  Scattered prominent mesenteric lymph nodes. Prominent left external iliac chain lymph nodes. Findings may be reactive, underlying lymphoproliferative  disorder malignancy is not excluded.  3.  Pancreatic atrophy with coarse calcifications along the uncinate process may indicate sequela of chronic pancreatitis.  4.  Moderate distention of the urinary bladder.  5.  Moderate size pericardial effusion  6.  Moderate right and small left pleural effusions with associated atelectasis.             Electronically Signed by: Morene Gulling on 11/16/2024 10:55 AM    XR Abdomen Portable  Final Result  IMPRESSION: No acute abnormality identified.    Electronically Signed by: Reyes People, MD on 11/15/2024 2:52 PM    Echocardiogram Limited WO Enhancing Agent  Final Result  IVC/SVC: The inferior vena cava demonstrates a diameter of >2.1 cm and   collapses >50%; therefore, the right atrial pressure is estimated at 8   mmHg.      XR Chest Ap Portable  Final Result  IMPRESSION:   RIGHT basilar atelectasis versus pneumonia with small few.    Cardiomegaly unchanged.    Electronically Signed by: Dorn Burkes on 11/15/2024 6:25 AM    XR Chest Ap Portable  Final Result  IMPRESSION:  Pulmonary venous congestion with mild bibasilar pulmonary opacities/atelectasis. Small right pleural effusion.    Electronically Signed by: Nadara Fret, MD on 11/10/2024 6:46 PM    XR Chest Ap Portable  Final Result  IMPRESSION:    Left subclavian Swan-Ganz catheter the tip is projected over the expected region of the pulmonary outflow tract.    Bilateral infiltrates unchanged from 11/02/2024    Electronically Signed by: Marinda Fleming, MD on 11/03/2024 10:01 PM    XR Chest Ap Portable  Final Result  IMPRESSION:  1. CHF    Electronically Signed by: Glendia Guillaume, MD on 11/02/2024 8:09 PM    Echocardiogram Complete W Enhancing Agent  Final Result  Left Ventricle: Systolic function is normal. EF: 65-70%. Wall motion is   normal.    Pericardium: There is a trivial to small pericardial effusion noted.   There is no echocardiographic evidence of  tamponade.    When compared to the previous study the pericardial effusion appears   smaller      Cardiac Catheterization  Final Result  He was on 6 L  of nasal cannula with borderline   oxygen  saturations.  He was given 25 mg of fentanyl .  With moving over to   the cath lab table, his oxygen  saturations dropped.  He is experiencing   severe pain and I think he has splinting due to his pain and not breathing   well.  His oxygen  saturations dropped to about 85 to 86%.  We elected to   cancel the case and likely try to do it early next week when he is better   recovered from his pain due to CPR.  Discussed with Dr Barnet      NM Lung Scan Perfusion Only  Final Result  IMPRESSION:    Low probability of pulmonary embolus based on perfusion only imaging.    Electronically Signed by: Sonny Appl, MD on 10/31/2024 1:04 PM    XR Chest Ap Portable  Final Result  IMPRESSION:    Pulmonary emphysema and small pleural effusions.     Electronically Signed by: Sonny Appl, MD on 10/31/2024 9:38 AM    XR Chest Ap Portable  Final Result  IMPRESSION:    1. Interval advancement of right IJ TVP with tip in the expected location of the right ventricle.  2. Hazy perihilar and interstitial opacities, unchanged.      Electronically Signed by: Morene Gulling on 10/29/2024 8:07 PM    XR Chest Ap Portable  Final Result  IMPRESSION:    1. Stable perihilar and interstitial opacities, likely mild pulmonary edema.  2. Right IJ TVP with tip in the proximal right ventricle.      Electronically Signed by: Morene Gulling on 10/29/2024 3:02 PM    CT Spine Cervical WO Contrast  Final Result  IMPRESSION: No acute fracture or traumatic malalignment of the cervical spine.    Electronically Signed by: Chad Holder, MD on 10/29/2024 9:18 AM    CT Head WO Contrast  Final Result  IMPRESSION: No acute intracranial abnormality.    Electronically Signed by: Chad Holder, MD on 10/29/2024 7:59 AM     Echocardiogram Complete W Enhancing Agent  Final Result  Tricuspid Valve: The right ventricular systolic pressure is severely   elevated (>60 mmHg).    IVC/SVC: The inferior vena cava demonstrates a diameter of >2.1 cm and   collapses <50%; therefore, the right atrial pressure is estimated at 15   mmHg.    Left Ventricle: Systolic function is normal. EF: 60-65%. There is   systolic flattening of the interventricular septum consistent with right   ventricle pressure overload.    Right Ventricle: Right ventricle is mildly dilated. Systolic function   is normal.    Pericardium: There is a small circumferential pericardial effusion   noted anterior and posterior to the heart. There is no echocardiographic   evidence of tamponade. The evidence against tamponade includes: no   excessive respiratory variation.    Pulmonic Valve: Mild regurgitation.    Normal LV size and function. LVEF 60-65%  There is evidence of elevated right sided filling pressures and severe   pulmonary hypertension.  There is a mild circumferential pericardial effusion with no evidence of   tamponade.    XR Chest Ap Portable  Final Result  IMPRESSION:   XR CHEST   Cardiomegaly with mild pulmonary venous congestion.  Mild bilateral basilar densities are present, likely secondary to subsegmental atelectasis and less likely developing infiltrates.          Electronically Signed by: Charlie Patch, MD on 10/28/2024 4:04 AM       **  Canceled**  **Canceled** Procedures (LRB): Heart Cath-Right Diagnostic (N/A)  11/01/2024  Surgeon(s): Norleen Carlin Breeding, MD -------------------  Procedures (LRB): Heart Cath-Right Diagnostic with FULL saturation run (N/A)  11/01/2024  Surgeon(s): Norleen Carlin Breeding, MD ------------------- [facility]   Discharge Procedure Orders  AMB REFERRAL TO DIABETES MANAGEMENT  Standing Status: Future  Referral Priority: Routine Referral Type: Consultation   Referral Reason: Evaluate and Return  Number of Visits Requested: 1 Expiration Date: 05/04/25   Ambulatory Referral to Home Health  Referral Priority: Routine Referral Type: Home Health Care  Referral Reason: Evaluate and Return  Requested Specialty: Home Health Services  Number of Visits Requested: 1 Expiration Date: 05/18/25   Consistent Carbohydrate Diet (diabetic)  Order Comments: 1.8 liter fluid restriction per day.  Glucerna with breakfast and dinner.   Notify physician - Temp  Order Comments: Call MD if Temperature above 100.5  Degrees F   Notify Physician for trouble breathing or symptoms that are worse   Notify physician - Contact your doctor for excessive bleeding   Notify Physician if unable to urinate   Notify Physician for increased abdominal swelling or bloating   Notify Physician for increased shortness of breath   Notify Physician for change in color/consistency of sputum   Notify Physician for Weight gain  Order Comments: 2-3 lbs overnight, 5 lbs in 5 days   Notify Physician for swelling in your ankles, feet, hands, neck or face (Heart Failure)   Notify Physician for palpitations (Fluttering in your chest)   Notify Physician for nausea, vomiting, or sweating combined with chest pain   Notify Physician for increase shortness of breath with activity or at rest   Notify Physician for dizziness when trying to stand   Activity as tolerated   Discharge instructions  Order Comments: Someone will call you with follow up appointment with your primary care team. CHF team reports you have appointment at CHF clinic on 11/27/24    Diet: Consistent Carbohydrate 1500 ML FLUID (400 ML PER TRAY) Dietary nutrition supplements Glucerna Consistent Carbohydrate Diet (diabetic)  Potential for Rehab:        Poor Code Status:   Full Code Disposition: Home with home health Consults:  Pulm/CCM Advanced CHF team Palliative care  Followup appointments:  PCP:  Someone to call for follow-up appointment   Future Appointments  Date Time Provider Department Center  11/27/2024 10:00 AM Krystal FORBES [place], [place] [facility] None   This note was dictated with voice recognition software. Similar sounding words can inadvertently be transcribed and may not be corrected upon review.   Time spent in discharge process:  total time spent 45 minutes   Electronically signed: Venetia LOISE Salt, MD 11/23/2024 / 12:44 PM  *Some images could not be shown.

## 2024-11-22 NOTE — Progress Notes (Signed)
 [facility] Case Management     Patient:   Micheal Parker Humble MR Number:  [id number] Patient Date of Birth: Jan 03, 1946 Age/Sex:  78 y.o./male     CM received msg from MD stating he wants to DC pt tomorrow and wanted to make sure pt's 02 company [employer] can accommodate his oxygen  requirements at discharge and  make sure he has the appropriate equipment. CM spoke with liaison Redell of [facility] and he said they can bring a 10L concentrator to pt's home tomorrow. CM sent pt info through a VM and they will make sure needs are met tomorrow. CM asked MD for an 02 order for [employer].   Electronically signed: Anette CHRISTELLA Clara, BSW 11/22/2024 / 12:12 PM

## 2024-11-22 NOTE — Progress Notes (Signed)
 Patient on home unit with 7L O2 bleed in. NAD noted.

## 2024-11-23 NOTE — Nursing Note (Signed)
 Discharge orders ,instructions and medications reviewed with patient  ,teach back method used he verbalized understanding ,no new complaints.Meds to home  and DME delivered to bedside. Patient given printed copy of AVS.

## 2024-11-23 NOTE — Progress Notes (Addendum)
 [facility] Case Management Discharge Note   Patient:   Micheal Parker MR Number:  [id number] Patient Date of Birth: June 18, 1946 Age/Sex:  78 y.o./male   Discharge Plan   Case Management interviewed: Patient, Decision maker Disposition: Home Health  Home Health/Other Discharge Services: PT, RN PT Services: Arranged RN Services: Arranged  Home Health CM discussed geographic area, insurance, NH financial affiliation, NH PACN, patient choice and agency will discuss cost invloved, if applicable.: Yes Home Health agency list was: provided Home Health referral agreed upon by interviewee will be sent to: [facility] via NH Link and expand search if no offers available Home Health Referral Coordination Status: CM sent referral to above preference(s) Home Health Liaison Name: [facility] Liaison Phone Number: [phone] Home Health Services accepted, selected and confirmation received through NH Link: Yes Home Health start of care date provided by agency: 11/21/24 Home Health Agency Services start of care date was provided to: Patient, Decision maker           Skilled nursing facility Is this a new placement to SNF?: Yes CM discussed geographic area, insurance, NH finanical affiliation, NH PACN, patient choice and facility will discuss cost involved, if applicable.: Yes Skilled Nursing Facility (SNF) list was: provided CM discussed with interviewee options for home support should patient progress or have barriers that prevent placement.: Yes SNF referral agreed upon by interviewee will be sent to: NHPACN, all facilities within 30 miles of patient's zipcode and expansion if no offers available SNF/FL2 Referral Coordination Status: CM requested the Resource Center to send referral to above preference(s) Insurance Authorization: is not needed prior to discharge Is placement for short term rehab or long term care: Short term care     Acute rehab  (IRF) CM discussed geographic area, insurance, NH financial affiliation, patient choice and facility will discuss cost involved, if applicable.: Yes Acute Rehab agency list was: made available to patient/caregiver to review at their convenience CM discussed with interviewee options for home support should patient progress or have barriers that prevent placement.: Yes Acute Rehab referral agreed upon by interviewee will be sent to: patient's preferred agency via NH Link, and expland search if no offers available Acute Rehab Preferred Agency: [facility] Insurance Authorization: is not needed prior to discharge (Patient has traditional Medicare) Acute Rehab Liaison Name: Corean Leaks Acute Rehab Liaison Phone Number: [phone] Acute Rehab referral accepted, selected and confirmation received through NH Link:  (Pending- [facility] liaison following)                 Discussed readiness, willingness and ability to provide or support self-management activities when needed after discharge from the acute care setting with: Patient, Other (comment) [place]) Coordinated Support Services: No Needs     Transportation   Does the patient need discharge transport arranged?: Yes Discharge Transportation: Wheelchair Has medical necessity form been completed?: No Has discharge transport been arranged?: No Who is arranging transport?: CM Mode of transportation?: private vehicle    Accepted Agency   Selected Continued Care - Admitted Since 10/28/2024     Home Medical Care Coordination complete.    Service Provider Services Address Phone   [facility] [address], [place] [address] [phone]             Patient discussed in IDT rounds; he is now needing 15L of oxygen  at exertion. CM spoke with Redell at [facility]; they  can accommodate but need new orders. CM asked provider for  new orders. Once it was placed, CM called Kim at [facility] and gave her patient's MRN so she could obtain the new orders. They will deliver it to his home prior to him leaving the hospital. Ambulance is scheduled for 1600. CM notified [facility] via NH Links of patient's discharge. Case Management has assessed this patient/family or caregiver's readiness, willingness and ability to provide or support self-management activities when needed after discharge from the acute care setting.   Electronically signed: Saddie KATHEE Sheen, BSW 11/23/2024 1:53 PM

## 2024-11-23 NOTE — Care Plan (Signed)
 " Problem: Deep Venous Thrombosis, Risk of Goal: Absence of deep venous thrombosis Outcome: Adequate for Discharge   Problem: Bleeding, Risk of Goal: Absence of active bleeding Outcome: Adequate for Discharge Goal: Absence of impaired coagulation signs and symptoms Outcome: Adequate for Discharge   Problem: Pressure Injury Goal: Pressure injury healing Outcome: Adequate for Discharge Goal: Absence of new pressure injury Outcome: Adequate for Discharge   Problem: Fall Prevention Goal: Absence of falls Outcome: Adequate for Discharge   Problem: Discharge Planning Goal: Knowledge of medical problems (What is my main problem?) Outcome: Adequate for Discharge Goal: Knowledge of self care (What do I need to do when I go home?) Outcome: Adequate for Discharge Goal: Knowledge of treatment plan (Why is it important for me to do this?) Outcome: Adequate for Discharge Goal: Knowledge of medication management Outcome: Adequate for Discharge   Problem: Injury Risk, Abnormal Glucose Level Goal: Glucose level within specified parameters Outcome: Adequate for Discharge   Problem: Sensory Perception - Impaired Goal: Absence of physical injury Outcome: Adequate for Discharge   Problem: Mobility, Impaired Goal: Bed Mobility Description: Patient will perform bed mobility with no assistance.(12/03/24) Olam Hacking, PT 11/05/2024 / 3:47 PM    Outcome: Adequate for Discharge Goal: Transfers Description: Patient will perform basic transfer with LRAD and supv assistance.(12/03/24) Olam Hacking, PT 11/05/2024 / 3:47 PM    Outcome: Adequate for Discharge Goal: Ambulation Description: Patient will ambulate 50 feet with LRAD with SB assistance.(12/03/24) Olam Hacking, PT 11/05/2024 / 3:47 PM    Outcome: Adequate for Discharge Goal: Stairs Description: Patient will negotiate 7 stairs with 2 rails and CG   assistance.(12/03/24) Olam Hacking, PT 11/05/2024 / 3:48 PM    Outcome: Adequate for  Discharge   Problem: Grooming, Impaired Goal: Grooming Description: Patient will safely complete daily grooming tasks while standing at sink with distant S by 12/31  Outcome: Adequate for Discharge   Problem: Bathing, Impaired Goal: Other (edit field to enter specific information) Description: Pt will safely complete bathing tasks, incorporating EC techniques as needed, with SBA by 12/31 Outcome: Adequate for Discharge   Problem: Toileting, Impaired Goal: Toileting Description: Patient will safely complete toileting tasks with S by 12/31  Outcome: Adequate for Discharge   Problem: Airway Clearance - Ineffective Goal: Able to cough effectively Outcome: Adequate for Discharge Goal: Patent airway Outcome: Adequate for Discharge   Problem: Breathing Pattern - Ineffective Goal: Effective breathing pattern Outcome: Adequate for Discharge   Problem: Pain Goal: Management of pain within stated acceptable levels Outcome: Adequate for Discharge   Problem: Activity Intolerance Goal: Improved Activity Tolerance Outcome: Adequate for Discharge   Problem: Nausea/Vomiting Goal: Absence of hematemesis Outcome: Adequate for Discharge Goal: Absence of nausea Outcome: Adequate for Discharge Goal: Control of acute pain to acceptable level Outcome: Adequate for Discharge   Problem: Electrolyte Imbalance Risk Goal: Electrolytes within specified parameters Outcome: Adequate for Discharge   Problem: Anxiety Goal: Alleviation of anxiety Outcome: Adequate for Discharge   Problem: Cardiac Output -  Decreased Goal: Cardiac output within specified parameters Outcome: Adequate for Discharge Goal: Hemodynamic stability Outcome: Adequate for Discharge   Problem: Tissue Perfusion, Cardiopulmonary - Altered Goal: Absence of angina Outcome: Adequate for Discharge Goal: Cardiac rhythm stable Outcome: Adequate for Discharge   Problem: Tissue Perfusion, Peripheral - Altered Goal: Absence of  hematoma, arterial access site Outcome: Adequate for Discharge Goal: Circulatory function, affected extremity, normal for patient Outcome: Adequate for Discharge   Problem: Fluid Volume - Excess Goal: Absence of fluid overload signs  and symptoms Outcome: Adequate for Discharge   Problem: Mood State Goal: Resident will verbalize improvement in mood Description: INTERVENTIONS: 1. Assess problems/needs related to mood 2. Assess for mood changes 3. Administer mood stabilizing medications as ordered 4. Provide opportunity for resident to express emotions as indicated Outcome: Adequate for Discharge   Problem: Urinary Elimination - Impaired Goal: Able to recognize urge to void Outcome: Adequate for Discharge Goal: Decrease in number of episodes of urinary incontinence Outcome: Adequate for Discharge Goal: Skin integrity intact Outcome: Adequate for Discharge   Problem: Nutrition Goal: Nutritional intake, appropriate for patient status Outcome: Adequate for Discharge   Problem: Pain - Acute Goal: Reduced pain sensation Outcome: Adequate for Discharge   "

## 2024-11-26 ENCOUNTER — Telehealth: Payer: Self-pay

## 2024-11-26 NOTE — Transitions of Care (Post Inpatient/ED Visit) (Signed)
" ° °  11/26/2024  Name: Micheal Parker MRN: [id number] DOB: 1946/05/24  Today's TOC FU Call Status: Today's TOC FU Call Status:: Unsuccessful Call (1st Attempt) Unsuccessful Call (1st Attempt) Date: 11/26/24  Attempted to reach the patient regarding the most recent Inpatient/ED visit.  Follow Up Plan: Additional outreach attempts will be made to reach the patient to complete the Transitions of Care (Post Inpatient/ED visit) call.   Rayme Bui J. Sarayah Bacchi RN, MSN [facility], [facility] RN Care Manager Direct Dial: [phone]  Fax: [phone] Website: [email or web address]   "

## 2024-11-28 ENCOUNTER — Telehealth: Payer: Self-pay

## 2024-11-28 NOTE — Transitions of Care (Post Inpatient/ED Visit) (Signed)
" ° °  11/28/2024  Name: Eleftherios Maurin MRN: [id number] DOB: [birth year]  Today's TOC FU Call Status: Today's TOC FU Call Status:: Unsuccessful Call (2nd Attempt) Unsuccessful Call (2nd Attempt) Date: 11/28/24  Attempted to reach the patient regarding the most recent Inpatient/ED visit.  Follow Up Plan: Additional outreach attempts will be made to reach the patient to complete the Transitions of Care (Post Inpatient/ED visit) call.   Arliene Rosenow J. Itzel Mckibbin RN, MSN [facility], [facility] RN Care Manager Direct Dial: [phone]  Fax: [phone] Website: [email or web address]   "

## 2025-01-02 NOTE — Nursing Note (Signed)
 Patient alert and oriented, vital signs stable.  Discharge instructions including changes with insulin  reviewed with patient. RN reeducated patient on importance of wearing oxygen  at all times. Patient indicated understanding with no further questions.  Patient waiting on EMS to transport home.

## 2025-01-03 ENCOUNTER — Telehealth: Payer: Self-pay

## 2025-01-03 NOTE — Transitions of Care (Post Inpatient/ED Visit) (Signed)
" ° °  01/03/2025  Name: Micheal Parker MRN: [id number] DOB: 07-Oct-1946  Today's TOC FU Call Status: Today's TOC FU Call Status:: Unsuccessful Call (1st Attempt) Unsuccessful Call (1st Attempt) Date: 01/03/25  Attempted to reach the patient regarding the most recent Inpatient/ED visit.  Follow Up Plan: Additional outreach attempts will be made to reach the patient to complete the Transitions of Care (Post Inpatient/ED visit) call.   Curry Dulski J. Teresita Fanton RN, MSN [facility], [facility] RN Care Manager Direct Dial: [phone]  Fax: [phone] Website: [email or web address]   "
